# Patient Record
Sex: Female | Born: 1940
Health system: Southern US, Community
[De-identification: ages and names within clinical notes are randomized; demographics above are authoritative.]

## PROBLEM LIST (undated history)

## (undated) DIAGNOSIS — K909 Intestinal malabsorption, unspecified: Secondary | ICD-10-CM

## (undated) DIAGNOSIS — D509 Iron deficiency anemia, unspecified: Secondary | ICD-10-CM

## (undated) HISTORY — DX: Iron deficiency anemia, unspecified: D50.9

## (undated) HISTORY — DX: Intestinal malabsorption, unspecified: K90.9

---

## 2002-11-16 ENCOUNTER — Encounter: Payer: Self-pay | Admitting: Hematology & Oncology

## 2002-11-16 ENCOUNTER — Ambulatory Visit (HOSPITAL_COMMUNITY): Admission: RE | Admit: 2002-11-16 | Discharge: 2002-11-16 | Payer: Self-pay | Admitting: Hematology & Oncology

## 2002-12-02 ENCOUNTER — Encounter (INDEPENDENT_AMBULATORY_CARE_PROVIDER_SITE_OTHER): Payer: Self-pay | Admitting: *Deleted

## 2002-12-02 ENCOUNTER — Ambulatory Visit (HOSPITAL_COMMUNITY): Admission: RE | Admit: 2002-12-02 | Discharge: 2002-12-02 | Payer: Self-pay | Admitting: Hematology & Oncology

## 2003-04-07 ENCOUNTER — Encounter: Payer: Self-pay | Admitting: Hematology & Oncology

## 2003-04-07 ENCOUNTER — Ambulatory Visit (HOSPITAL_COMMUNITY): Admission: RE | Admit: 2003-04-07 | Discharge: 2003-04-07 | Payer: Self-pay | Admitting: Hematology & Oncology

## 2004-09-16 ENCOUNTER — Ambulatory Visit: Payer: Self-pay | Admitting: Hematology & Oncology

## 2005-03-18 ENCOUNTER — Ambulatory Visit: Payer: Self-pay | Admitting: Hematology & Oncology

## 2005-09-16 ENCOUNTER — Ambulatory Visit: Payer: Self-pay | Admitting: Hematology & Oncology

## 2006-01-22 ENCOUNTER — Ambulatory Visit: Payer: Self-pay | Admitting: Hematology & Oncology

## 2006-01-23 LAB — CBC WITH DIFFERENTIAL/PLATELET
BASO%: 0.4 % (ref 0.0–2.0)
EOS%: 1.2 % (ref 0.0–7.0)
MCH: 34 pg (ref 26.0–34.0)
MCHC: 34.1 g/dL (ref 32.0–36.0)
RBC: 4.28 10*6/uL (ref 3.70–5.32)
RDW: 13.1 % (ref 11.3–14.5)
lymph#: 5.7 10*3/uL — ABNORMAL HIGH (ref 0.9–3.3)

## 2006-01-23 LAB — CHCC SMEAR

## 2006-08-17 ENCOUNTER — Ambulatory Visit: Payer: Self-pay | Admitting: Hematology & Oncology

## 2006-08-19 LAB — CBC WITH DIFFERENTIAL/PLATELET
BASO%: 0.7 % (ref 0.0–2.0)
EOS%: 1.9 % (ref 0.0–7.0)
HCT: 43.2 % (ref 34.8–46.6)
LYMPH%: 41.8 % (ref 14.0–48.0)
MCH: 34.4 pg — ABNORMAL HIGH (ref 26.0–34.0)
MCHC: 33.9 g/dL (ref 32.0–36.0)
MCV: 101.6 fL — ABNORMAL HIGH (ref 81.0–101.0)
MONO%: 10.1 % (ref 0.0–13.0)
NEUT%: 45.5 % (ref 39.6–76.8)
Platelets: 315 10*3/uL (ref 145–400)
lymph#: 5.6 10*3/uL — ABNORMAL HIGH (ref 0.9–3.3)

## 2006-09-22 ENCOUNTER — Encounter: Admission: RE | Admit: 2006-09-22 | Discharge: 2006-09-22 | Payer: Self-pay | Admitting: Hematology & Oncology

## 2006-10-07 ENCOUNTER — Ambulatory Visit: Payer: Self-pay | Admitting: Oncology

## 2007-01-13 ENCOUNTER — Encounter: Admission: RE | Admit: 2007-01-13 | Discharge: 2007-01-13 | Payer: Self-pay | Admitting: Hematology & Oncology

## 2007-05-03 ENCOUNTER — Ambulatory Visit: Payer: Self-pay | Admitting: Hematology & Oncology

## 2007-06-07 LAB — CBC WITH DIFFERENTIAL/PLATELET
Basophils Absolute: 0.1 10*3/uL (ref 0.0–0.1)
EOS%: 1.8 % (ref 0.0–7.0)
HCT: 39.3 % (ref 34.8–46.6)
HGB: 13.7 g/dL (ref 11.6–15.9)
LYMPH%: 45.5 % (ref 14.0–48.0)
MCH: 34 pg (ref 26.0–34.0)
MCHC: 34.9 g/dL (ref 32.0–36.0)
MCV: 97.5 fL (ref 81.0–101.0)
MONO%: 11.7 % (ref 0.0–13.0)
NEUT%: 40.3 % (ref 39.6–76.8)
Platelets: 303 10*3/uL (ref 145–400)
lymph#: 3.9 10*3/uL — ABNORMAL HIGH (ref 0.9–3.3)

## 2007-12-13 ENCOUNTER — Ambulatory Visit: Payer: Self-pay | Admitting: Hematology & Oncology

## 2007-12-15 LAB — COMPREHENSIVE METABOLIC PANEL
ALT: 12 U/L (ref 0–35)
CO2: 25 mEq/L (ref 19–32)
Chloride: 103 mEq/L (ref 96–112)
Sodium: 138 mEq/L (ref 135–145)
Total Bilirubin: 0.4 mg/dL (ref 0.3–1.2)
Total Protein: 6.8 g/dL (ref 6.0–8.3)

## 2007-12-15 LAB — CBC WITH DIFFERENTIAL/PLATELET
BASO%: 0.7 % (ref 0.0–2.0)
EOS%: 2.4 % (ref 0.0–7.0)
HCT: 41.6 % (ref 34.8–46.6)
LYMPH%: 56 % — ABNORMAL HIGH (ref 14.0–48.0)
MCH: 33.6 pg (ref 26.0–34.0)
MCHC: 34.1 g/dL (ref 32.0–36.0)
MCV: 98.7 fL (ref 81.0–101.0)
MONO#: 1 10*3/uL — ABNORMAL HIGH (ref 0.1–0.9)
MONO%: 10.4 % (ref 0.0–13.0)
NEUT%: 30.5 % — ABNORMAL LOW (ref 39.6–76.8)
Platelets: 221 10*3/uL (ref 145–400)
RBC: 4.22 10*6/uL (ref 3.70–5.32)
RDW: 12.7 % (ref 11.3–14.5)
WBC: 9.8 10*3/uL (ref 3.9–10.0)

## 2007-12-15 LAB — LACTATE DEHYDROGENASE: LDH: 145 U/L (ref 94–250)

## 2007-12-15 LAB — CHCC SMEAR

## 2008-06-13 ENCOUNTER — Ambulatory Visit: Payer: Self-pay | Admitting: Hematology & Oncology

## 2008-06-14 LAB — CBC WITH DIFFERENTIAL (CANCER CENTER ONLY)
Eosinophils Absolute: 0.3 10*3/uL (ref 0.0–0.5)
LYMPH#: 6.1 10*3/uL — ABNORMAL HIGH (ref 0.9–3.3)
MONO#: 1 10*3/uL — ABNORMAL HIGH (ref 0.1–0.9)
NEUT#: 4 10*3/uL (ref 1.5–6.5)
Platelets: 315 10*3/uL (ref 145–400)
RBC: 4.21 10*6/uL (ref 3.70–5.32)
WBC: 11.6 10*3/uL — ABNORMAL HIGH (ref 3.9–10.0)

## 2008-12-01 ENCOUNTER — Ambulatory Visit: Payer: Self-pay | Admitting: Hematology & Oncology

## 2008-12-11 LAB — CBC WITH DIFFERENTIAL (CANCER CENTER ONLY)
EOS%: 2 % (ref 0.0–7.0)
MCH: 33.3 pg (ref 26.0–34.0)
MCHC: 33.5 g/dL (ref 32.0–36.0)
MONO%: 7.7 % (ref 0.0–13.0)
NEUT#: 4.4 10*3/uL (ref 1.5–6.5)
Platelets: 329 10*3/uL (ref 145–400)
RBC: 4.01 10*6/uL (ref 3.70–5.32)

## 2009-07-17 ENCOUNTER — Ambulatory Visit: Payer: Self-pay | Admitting: Hematology & Oncology

## 2009-07-18 LAB — FERRITIN: Ferritin: 45 ng/mL (ref 10–291)

## 2009-07-18 LAB — COMPREHENSIVE METABOLIC PANEL
ALT: 14 U/L (ref 0–35)
AST: 19 U/L (ref 0–37)
Albumin: 4.3 g/dL (ref 3.5–5.2)
CO2: 27 mEq/L (ref 19–32)
Calcium: 9.3 mg/dL (ref 8.4–10.5)
Chloride: 103 mEq/L (ref 96–112)
Creatinine, Ser: 0.63 mg/dL (ref 0.40–1.20)
Potassium: 4.2 mEq/L (ref 3.5–5.3)

## 2009-07-18 LAB — CBC WITH DIFFERENTIAL (CANCER CENTER ONLY)
BASO#: 0.1 10*3/uL (ref 0.0–0.2)
BASO%: 1 % (ref 0.0–2.0)
EOS%: 2.6 % (ref 0.0–7.0)
HGB: 14.3 g/dL (ref 11.6–15.9)
MCH: 34 pg (ref 26.0–34.0)
MCHC: 33.9 g/dL (ref 32.0–36.0)
MONO%: 10.6 % (ref 0.0–13.0)
NEUT#: 1.9 10*3/uL (ref 1.5–6.5)
NEUT%: 27.7 % — ABNORMAL LOW (ref 39.6–80.0)
RDW: 10.8 % (ref 10.5–14.6)

## 2009-07-18 LAB — RETICULOCYTES (CHCC)
ABS Retic: 75.6 10*3/uL (ref 19.0–186.0)
RBC.: 4.2 MIL/uL (ref 3.87–5.11)
Retic Ct Pct: 1.8 % (ref 0.4–3.1)

## 2009-07-18 LAB — CHCC SATELLITE - SMEAR

## 2010-01-14 ENCOUNTER — Ambulatory Visit: Payer: Self-pay | Admitting: Hematology & Oncology

## 2010-01-16 LAB — CBC WITH DIFFERENTIAL (CANCER CENTER ONLY)
BASO%: 0.9 % (ref 0.0–2.0)
LYMPH#: 5.5 10*3/uL — ABNORMAL HIGH (ref 0.9–3.3)
MONO#: 0.8 10*3/uL (ref 0.1–0.9)
NEUT#: 3.1 10*3/uL (ref 1.5–6.5)
Platelets: 335 10*3/uL (ref 145–400)
RDW: 10.9 % (ref 10.5–14.6)
WBC: 9.7 10*3/uL (ref 3.9–10.0)

## 2010-01-16 LAB — MORPHOLOGY - CHCC SATELLITE
PLT EST ~~LOC~~: ADEQUATE
Platelet Morphology: NORMAL
RBC Comments: NORMAL

## 2010-07-09 ENCOUNTER — Ambulatory Visit: Payer: Self-pay | Admitting: Hematology & Oncology

## 2010-08-01 LAB — CBC WITH DIFFERENTIAL (CANCER CENTER ONLY)
BASO#: 0.1 10e3/uL (ref 0.0–0.2)
BASO%: 1 % (ref 0.0–2.0)
EOS%: 1.8 % (ref 0.0–7.0)
Eosinophils Absolute: 0.2 10e3/uL (ref 0.0–0.5)
HCT: 41.1 % (ref 34.8–46.6)
HGB: 14.2 g/dL (ref 11.6–15.9)
LYMPH#: 5.7 10e3/uL — ABNORMAL HIGH (ref 0.9–3.3)
LYMPH%: 59.9 % — ABNORMAL HIGH (ref 14.0–48.0)
MCH: 34.9 pg — ABNORMAL HIGH (ref 26.0–34.0)
MCHC: 34.5 g/dL (ref 32.0–36.0)
MCV: 101 fL (ref 81–101)
MONO#: 1.1 10e3/uL — ABNORMAL HIGH (ref 0.1–0.9)
MONO%: 11.2 % (ref 0.0–13.0)
NEUT#: 2.5 10e3/uL (ref 1.5–6.5)
NEUT%: 26.1 % — ABNORMAL LOW (ref 39.6–80.0)
Platelets: 349 10e3/uL (ref 145–400)
RBC: 4.07 10e6/uL (ref 3.70–5.32)
RDW: 10.3 % — ABNORMAL LOW (ref 10.5–14.6)
WBC: 9.6 10e3/uL (ref 3.9–10.0)

## 2010-08-01 LAB — TECHNOLOGIST REVIEW CHCC SATELLITE

## 2010-08-01 LAB — CHCC SATELLITE - SMEAR

## 2011-02-06 ENCOUNTER — Encounter (HOSPITAL_BASED_OUTPATIENT_CLINIC_OR_DEPARTMENT_OTHER): Payer: Medicare Other | Admitting: Hematology & Oncology

## 2011-02-06 ENCOUNTER — Other Ambulatory Visit: Payer: Self-pay | Admitting: Hematology & Oncology

## 2011-02-06 DIAGNOSIS — D510 Vitamin B12 deficiency anemia due to intrinsic factor deficiency: Secondary | ICD-10-CM

## 2011-02-06 DIAGNOSIS — D693 Immune thrombocytopenic purpura: Secondary | ICD-10-CM

## 2011-02-06 LAB — CBC WITH DIFFERENTIAL (CANCER CENTER ONLY)
BASO#: 0 10*3/uL (ref 0.0–0.2)
EOS%: 0.9 % (ref 0.0–7.0)
HGB: 13.6 g/dL (ref 11.6–15.9)
MCH: 32.9 pg (ref 26.0–34.0)
MCHC: 35.4 g/dL (ref 32.0–36.0)
MONO%: 12 % (ref 0.0–13.0)
NEUT#: 2.8 10*3/uL (ref 1.5–6.5)
Platelets: 237 10*3/uL (ref 145–400)

## 2011-02-07 LAB — VITAMIN D 25 HYDROXY (VIT D DEFICIENCY, FRACTURES): Vit D, 25-Hydroxy: 33 ng/mL (ref 30–89)

## 2011-03-07 NOTE — Op Note (Signed)
   NAME:  Kathleen Pace, COURTNEY NO.:  [id number]   MEDICAL RECORD NO.:  [id number]                   PATIENT TYPE:  OUT   LOCATION:  [facility]                                 FACILITY:  [facility]   PHYSICIAN:  Rose Phi. Myna Hidalgo, M.D.              DATE OF BIRTH:  1941/09/22   DATE OF PROCEDURE:  DATE OF DISCHARGE:                                 OPERATIVE REPORT   PROCEDURE:  Left posterior iliac crest bone graft biopsy and aspirate.   DESCRIPTION OF PROCEDURE:  [name] was brought to the short-stay unit.  She was placed on a monitor.  She had an IV placed.  All vital signs were  stable.  She received a total of 4 mg of Versed IV and 30 mg of Demerol.  She had her left posterior iliac crest region prepped and draped in a  sterile fashion.  She then had 10 cc of lidocaine infiltrated under the skin  down to periosteum.  Carefully, a #11 scalpel was used to make an incision  to the skin.  A good bone marrow aspirate was obtained without difficulties.  A second aspirate was obtained for cytogenetics.   A second incision was made to the skin with a #11 scalpel.  Two bone marrow  cores were taken without difficulty.  The patient tolerated the procedure  well.  There was no bleeding.                                               Rose Phi. Myna Hidalgo, M.D.    PRE/MEDQ  D:  12/02/2002  T:  12/02/2002  Job:  [id number]

## 2011-09-18 ENCOUNTER — Encounter: Payer: Self-pay | Admitting: *Deleted

## 2011-09-19 ENCOUNTER — Ambulatory Visit (HOSPITAL_BASED_OUTPATIENT_CLINIC_OR_DEPARTMENT_OTHER): Payer: Medicare Other | Admitting: Hematology & Oncology

## 2011-09-19 ENCOUNTER — Other Ambulatory Visit (HOSPITAL_BASED_OUTPATIENT_CLINIC_OR_DEPARTMENT_OTHER): Payer: Medicare Other | Admitting: Lab

## 2011-09-19 ENCOUNTER — Other Ambulatory Visit (HOSPITAL_COMMUNITY)
Admission: RE | Admit: 2011-09-19 | Discharge: 2011-09-19 | Disposition: A | Discharge status: Home / Self Care | Payer: Medicare Other | Source: Ambulatory Visit | Attending: Hematology & Oncology | Admitting: Hematology & Oncology

## 2011-09-19 ENCOUNTER — Other Ambulatory Visit: Payer: Self-pay | Admitting: Hematology & Oncology

## 2011-09-19 ENCOUNTER — Telehealth: Payer: Self-pay | Admitting: *Deleted

## 2011-09-19 DIAGNOSIS — D510 Vitamin B12 deficiency anemia due to intrinsic factor deficiency: Secondary | ICD-10-CM

## 2011-09-19 DIAGNOSIS — D693 Immune thrombocytopenic purpura: Secondary | ICD-10-CM

## 2011-09-19 DIAGNOSIS — D72820 Lymphocytosis (symptomatic): Secondary | ICD-10-CM | POA: Insufficient documentation

## 2011-09-19 DIAGNOSIS — L80 Vitiligo: Secondary | ICD-10-CM | POA: Insufficient documentation

## 2011-09-19 LAB — CBC WITH DIFFERENTIAL (CANCER CENTER ONLY)
BASO%: 0.4 % (ref 0.0–2.0)
Eosinophils Absolute: 0.2 10*3/uL (ref 0.0–0.5)
LYMPH%: 56.7 % — ABNORMAL HIGH (ref 14.0–48.0)
MCH: 34.6 pg — ABNORMAL HIGH (ref 26.0–34.0)
MCHC: 35.1 g/dL (ref 32.0–36.0)
MCV: 99 fL (ref 81–101)
MONO%: 11.8 % (ref 0.0–13.0)
Platelets: 300 10*3/uL (ref 145–400)
RDW: 12.3 % (ref 11.1–15.7)

## 2011-09-19 LAB — CHCC SATELLITE - SMEAR

## 2011-09-19 NOTE — Telephone Encounter (Signed)
Pt moved 4-4 to 4-11 °

## 2011-09-19 NOTE — Progress Notes (Signed)
This office note has been dictated.

## 2011-09-19 NOTE — Progress Notes (Signed)
DIAGNOSIS: 1. Immune thrombocytopenia-remission. 2. Pernicious anemia. 3. Vitiligo.  CURRENT THERAPY:  Vitamin B12 1 mg IM q. month-self-administered.  INTERIM HISTORY:  Kathleen Pace comes in for a 6 month followup.  She is doing well.  She has had no complaints since we last saw her.  She did have a bout of pneumonia last year.  Since then, she has had no issues. She has not noted any problems with nausea or vomiting.  She has had no bleeding or bruising.  There has been no change in bowel or bladder habits.  PHYSICAL EXAM:  General: This is a well-developed, well-nourished, white female in no obvious distress.  Vital signs show a temperature of 98.7, pulse 55, respiratory 18, blood pressure 153/76, and weight is 146. Head and neck exam shows a normocephalic, atraumatic skull.  There are no ocular or oral lesions.  There are no palpable cervical or supraclavicular lymph nodes.  Lungs are clear to percussion and auscultation bilaterally.  Cardiac exam: Regular rate and rhythm with a normal S1 and S2.  There are no murmurs, rubs, or bruits.  Abdominal exam: Soft with good bowel sounds.  There is no palpable abdominal mass. There is no palpable hepatomegaly.  He has a well-healed splenectomy scar.  Back exam shows kyphosis.  She has no tenderness over the spine, ribs, or hips.  Extremities shows no clubbing, cyanosis, or edema. Neurological exam shows no focal neurological deficits.  Skin exam shows areas of vitiligo.  LABORATORY STUDIES:  White cell count 8, hemoglobin 14, hematocrit 40, and platelet count is 300,000.  White cell differential shows 30 segs, 57 lymphocytes, and 12 monos.  IMPRESSION:  Kathleen Pace is a 70 year old white female with multiple autoimmune issues.  Right now, I do not see that the elevated lymphocytes is going to be an issue for [place].  We are sending her blood for flow cytometry.  It is possible she may have a monoclonal population of cells.  So, I really  do not believe that we need to embark upon any extensive workup because she is asymptomatic.  We will plan to get her back to see [place] in another 5 months.    ______________________________ Josph Macho, M.D. PRE/MEDQ  D:  09/19/2011  T:  09/19/2011  Job:  [id number]  ADDENDUM:  Flow cytometry is (-).  Normal Ferritin

## 2011-09-23 LAB — PROTEIN ELECTROPHORESIS, SERUM
Alpha-1-Globulin: 4.6 % (ref 2.9–4.9)
Alpha-2-Globulin: 10.3 % (ref 7.1–11.8)
Beta 2: 6.1 % (ref 3.2–6.5)
Beta Globulin: 6.8 % (ref 4.7–7.2)
Gamma Globulin: 14.4 % (ref 11.1–18.8)

## 2011-09-23 LAB — ERYTHROPOIETIN: Erythropoietin: 12.9 m[IU]/mL (ref 2.6–34.0)

## 2011-09-23 LAB — LACTATE DEHYDROGENASE: LDH: 153 U/L (ref 94–250)

## 2011-09-24 LAB — FLOW CYTOMETRY - CHCC SATELLITE

## 2011-09-30 NOTE — Progress Notes (Signed)
Quick Note:  Spoke with pt and told her that Dr. Myna Hidalgo did not see any blood disorder like leukemia in her blood. Pt voiced understanding. ______

## 2012-01-29 ENCOUNTER — Other Ambulatory Visit (HOSPITAL_BASED_OUTPATIENT_CLINIC_OR_DEPARTMENT_OTHER): Payer: Medicare Other | Admitting: Lab

## 2012-01-29 ENCOUNTER — Ambulatory Visit (HOSPITAL_BASED_OUTPATIENT_CLINIC_OR_DEPARTMENT_OTHER): Payer: Medicare Other | Admitting: Hematology & Oncology

## 2012-01-29 VITALS — BP 124/68 | HR 68 | Temp 98.5°F | Ht 63.0 in | Wt 144.0 lb

## 2012-01-29 DIAGNOSIS — D693 Immune thrombocytopenic purpura: Secondary | ICD-10-CM

## 2012-01-29 DIAGNOSIS — L80 Vitiligo: Secondary | ICD-10-CM

## 2012-01-29 DIAGNOSIS — D510 Vitamin B12 deficiency anemia due to intrinsic factor deficiency: Secondary | ICD-10-CM

## 2012-01-29 LAB — CBC WITH DIFFERENTIAL (CANCER CENTER ONLY)
BASO#: 0 10*3/uL (ref 0.0–0.2)
Eosinophils Absolute: 0.1 10*3/uL (ref 0.0–0.5)
HGB: 13.8 g/dL (ref 11.6–15.9)
LYMPH#: 3.7 10*3/uL — ABNORMAL HIGH (ref 0.9–3.3)
MONO#: 0.9 10*3/uL (ref 0.1–0.9)
NEUT#: 2 10*3/uL (ref 1.5–6.5)
RBC: 3.97 10*6/uL (ref 3.70–5.32)

## 2012-01-29 NOTE — Progress Notes (Signed)
This office note has been dictated.
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
## 2012-04-15 ENCOUNTER — Other Ambulatory Visit: Payer: Self-pay | Admitting: Hematology & Oncology

## 2012-07-29 ENCOUNTER — Other Ambulatory Visit (HOSPITAL_BASED_OUTPATIENT_CLINIC_OR_DEPARTMENT_OTHER): Payer: Medicare Other | Admitting: Lab

## 2012-07-29 ENCOUNTER — Ambulatory Visit (HOSPITAL_BASED_OUTPATIENT_CLINIC_OR_DEPARTMENT_OTHER): Payer: Medicare Other | Admitting: Medical

## 2012-07-29 VITALS — BP 159/53 | HR 61 | Temp 98.3°F | Resp 16 | Ht 63.0 in | Wt 143.0 lb

## 2012-07-29 DIAGNOSIS — D510 Vitamin B12 deficiency anemia due to intrinsic factor deficiency: Secondary | ICD-10-CM

## 2012-07-29 DIAGNOSIS — D693 Immune thrombocytopenic purpura: Secondary | ICD-10-CM

## 2012-07-29 LAB — CBC WITH DIFFERENTIAL (CANCER CENTER ONLY)
Eosinophils Absolute: 0.1 10*3/uL (ref 0.0–0.5)
HCT: 41.5 % (ref 34.8–46.6)
LYMPH%: 55.7 % — ABNORMAL HIGH (ref 14.0–48.0)
MCV: 100 fL (ref 81–101)
MONO#: 1.5 10*3/uL — ABNORMAL HIGH (ref 0.1–0.9)
NEUT%: 29.1 % — ABNORMAL LOW (ref 39.6–80.0)
Platelets: 331 10*3/uL (ref 145–400)
RBC: 4.15 10*6/uL (ref 3.70–5.32)
WBC: 10.6 10*3/uL — ABNORMAL HIGH (ref 3.9–10.0)

## 2012-07-29 LAB — CHCC SATELLITE - SMEAR

## 2012-07-29 NOTE — Progress Notes (Signed)
Diagnoses: #1 immune thrombocytopenia, clinical remission. #2, pernicious anemia. #3 vitiligo.  Current therapy: Vitamin B12, 1 mg IM q. monthly (self-administered).  Interim history: Kathleen Pace presents today for an office followup visit.  Overall, she, reports, that she's been doing relatively well. She reports, that she's had some sinus issues for the past week and is wanting an antibiotic.  She does have a productive cough, and reports, that her sputum is yellowish in color.  She denies any type of fevers.  She continues to remain active.  She still continues to take care of her 34 year old, mother.  She denies any chest pain, shortness of breath.  Any bony pain.  She denies any headaches, visual changes, or rashes.  She denies any fevers, chills, or night sweats.  She denies any nausea, vomiting, diarrhea, or constipation.  She has a good appetite.  Her lab work still continues to look good.  Her platelet count is 331,000.  Review of Systems: Pt. Denies any changes in their vision, hearing, adenopathy, fevers, chills, nausea, vomiting, diarrhea, constipation, chest pain, shortness of breath, passing blood, passing out, blacking out,  any changes in skin, joints, neurologic or psychiatric except as noted.  Physical Exam: This is a well-developed, well-nourished 71 year old, white female, in no obvious distress Vitals: Temperature 90.3 degrees, pulse 61, respirations 16, blood pressure 159/53, weight 143 pounds HEENT reveals a normocephalic, atraumatic skull, no scleral icterus, no oral lesions  Neck is supple without any cervical or supraclavicular adenopathy.  Lungs are clear to auscultation bilaterally. There are no wheezes, rales or rhonci Cardiac is regular rate and rhythm with a normal S1 and S2. There are no murmurs, rubs, or bruits.  Abdomen is soft with good bowel sounds, there is no palpable mass. There is no palpable hepatosplenomegaly. There is no palpable fluid wave.    Musculoskeletal no tenderness of the spine, ribs, or hips.  Back exam does show some kyphosis, which is stable Extremities there are no clubbing, cyanosis, or edema.  Skin no petechia, purpura or ecchymosis Neurologic is nonfocal.  Laboratory Data: White count 10.6, hemoglobin 14.3, hematocrit 41.5.  Platelets 331,000  Current Outpatient Prescriptions on File Prior to Visit  Medication Sig Dispense Refill  . acetaminophen (TYLENOL) 325 MG tablet Take 650 mg by mouth every 6 (six) hours as needed.        . budesonide-formoterol (SYMBICORT) 80-4.5 MCG/ACT inhaler Inhale 2 puffs into the lungs 2 (two) times daily.        . calcium-vitamin D (OSCAL WITH D) 500-200 MG-UNIT per tablet Take 1 tablet by mouth daily.        . cyanocobalamin (,VITAMIN B-12,) 1000 MCG/ML injection INJECT 1 ML ONCE MONTHLY  10 mL  0  . esomeprazole (NEXIUM) 40 MG capsule Take 40 mg by mouth daily before breakfast.        . levothyroxine (SYNTHROID, LEVOTHROID) 125 MCG tablet Take 125 mcg by mouth daily.      Marland Kitchen omega-3 acid ethyl esters (LOVAZA) 1 G capsule Take 2 g by mouth 2 (two) times daily.        . risedronate (ACTONEL) 150 MG tablet Take 150 mg by mouth every 30 (thirty) days. with water on empty stomach, nothing by mouth or lie down for next 30 minutes.       . sertraline (ZOLOFT) 50 MG tablet Take 50 mg by mouth daily.        . vitamin B-12 (CYANOCOBALAMIN) 1000 MCG tablet 1,000 mcg by Other route daily.  Assessment/Plan: This is a pleasant, 71 year old, white female, with the following issues:  #1.  History of immune thrombocytopenia..  She does have an odd immune complex.  Her platelets continue to be within the normal limits.  For now we do not need to do anything different.  #2, pernicious anemia.  She will continue to give herself self injections of B12.  #3 followup.  [name] will follow back up with [place] in 6 months, but before then should there be questions or concerns.

## 2013-01-27 ENCOUNTER — Other Ambulatory Visit (HOSPITAL_BASED_OUTPATIENT_CLINIC_OR_DEPARTMENT_OTHER): Payer: Medicare Other | Admitting: Lab

## 2013-01-27 ENCOUNTER — Ambulatory Visit (HOSPITAL_BASED_OUTPATIENT_CLINIC_OR_DEPARTMENT_OTHER): Payer: Medicare Other | Admitting: Hematology & Oncology

## 2013-01-27 VITALS — BP 124/61 | HR 56 | Temp 97.9°F | Resp 16 | Ht 63.0 in | Wt 146.0 lb

## 2013-01-27 DIAGNOSIS — D510 Vitamin B12 deficiency anemia due to intrinsic factor deficiency: Secondary | ICD-10-CM

## 2013-01-27 DIAGNOSIS — D72829 Elevated white blood cell count, unspecified: Secondary | ICD-10-CM

## 2013-01-27 DIAGNOSIS — D693 Immune thrombocytopenic purpura: Secondary | ICD-10-CM

## 2013-01-27 DIAGNOSIS — D72820 Lymphocytosis (symptomatic): Secondary | ICD-10-CM

## 2013-01-27 LAB — CBC WITH DIFFERENTIAL (CANCER CENTER ONLY)
BASO#: 0 10*3/uL (ref 0.0–0.2)
BASO%: 0.2 % (ref 0.0–2.0)
EOS%: 0.7 % (ref 0.0–7.0)
HCT: 38.5 % (ref 34.8–46.6)
HGB: 13.1 g/dL (ref 11.6–15.9)
LYMPH#: 4.9 10*3/uL — ABNORMAL HIGH (ref 0.9–3.3)
MCH: 34 pg (ref 26.0–34.0)
MCHC: 34 g/dL (ref 32.0–36.0)
MONO%: 12.5 % (ref 0.0–13.0)
NEUT#: 2.8 10*3/uL (ref 1.5–6.5)
NEUT%: 31.9 % — ABNORMAL LOW (ref 39.6–80.0)
RDW: 13 % (ref 11.1–15.7)

## 2013-01-27 NOTE — Progress Notes (Signed)
This office note has been dictated.
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
## 2013-03-02 ENCOUNTER — Ambulatory Visit: Payer: Medicare Other | Admitting: Hematology & Oncology

## 2013-03-02 ENCOUNTER — Ambulatory Visit: Payer: Medicare Other

## 2013-03-02 ENCOUNTER — Other Ambulatory Visit: Payer: Medicare Other | Admitting: Lab

## 2013-06-29 ENCOUNTER — Other Ambulatory Visit: Payer: Self-pay | Admitting: Hematology & Oncology

## 2013-07-28 ENCOUNTER — Other Ambulatory Visit: Payer: Medicare Other | Admitting: Lab

## 2013-07-28 ENCOUNTER — Ambulatory Visit (HOSPITAL_BASED_OUTPATIENT_CLINIC_OR_DEPARTMENT_OTHER): Payer: Medicare Other | Admitting: Hematology & Oncology

## 2013-07-28 ENCOUNTER — Other Ambulatory Visit (HOSPITAL_BASED_OUTPATIENT_CLINIC_OR_DEPARTMENT_OTHER): Payer: Medicare Other | Admitting: Lab

## 2013-07-28 ENCOUNTER — Ambulatory Visit: Payer: Medicare Other | Admitting: Hematology & Oncology

## 2013-07-28 VITALS — BP 115/56 | HR 70 | Temp 97.9°F | Resp 14 | Ht 63.0 in | Wt 145.0 lb

## 2013-07-28 DIAGNOSIS — D510 Vitamin B12 deficiency anemia due to intrinsic factor deficiency: Secondary | ICD-10-CM

## 2013-07-28 DIAGNOSIS — D693 Immune thrombocytopenic purpura: Secondary | ICD-10-CM

## 2013-07-28 LAB — CBC WITH DIFFERENTIAL (CANCER CENTER ONLY)
BASO%: 0.3 % (ref 0.0–2.0)
EOS%: 1 % (ref 0.0–7.0)
LYMPH%: 56.2 % — ABNORMAL HIGH (ref 14.0–48.0)
MCH: 34.1 pg — ABNORMAL HIGH (ref 26.0–34.0)
MCV: 101 fL (ref 81–101)
MONO%: 12.5 % (ref 0.0–13.0)
NEUT#: 2.9 10*3/uL (ref 1.5–6.5)
Platelets: 313 10*3/uL (ref 145–400)
RDW: 12.6 % (ref 11.1–15.7)

## 2013-07-28 LAB — CHCC SATELLITE - SMEAR

## 2013-07-28 NOTE — Progress Notes (Signed)
This office note has been dictated.

## 2013-07-29 NOTE — Progress Notes (Signed)
CC:   [name], MD  DIAGNOSES: 1. Chronic immune thrombocytopenia, remission. 2. Pernicious anemia. 3. Vitiligo.  CURRENT THERAPY:  Self-administered vitamin B12 one milligram IM q. month.  INTERIM HISTORY:  [name] comes in for her followup.  She is doing great.  We last saw her back in April.  She has had a good summer.  She has been doing some traveling.  She has had no problems with nausea or vomiting.  There has been no cough or shortness of breath.  There is no change in bowel or bladder habits.  She has had no fatigue or weakness.  There has been no leg swelling.  There has been no bony pain.  PHYSICAL EXAMINATION:  General:  This is a well-developed, well- nourished white female in no obvious distress.  Vital signs: Temperature of 97.9, pulse 67, respiratory rate 14, blood pressure 182/55.  Weight is 145.  Head and neck:  Normocephalic, atraumatic skull.  There are no ocular or oral lesions.  There are no palpable cervical or supraclavicular lymph nodes.  Lungs:  Clear bilaterally. Cardiac:  Regular rate and rhythm with a normal S1 and S2.  She may have a 1/6 systolic ejection murmur.  Abdomen:  Soft.  She has good bowel sounds.  There is no fluid wave.  There is no palpable hepatomegaly. She has a well-healed splenectomy scar.  Back:  Some kyphosis.  She has a little bit of scoliosis.  No tenderness is noted over the spine, ribs, or hips.  Extremities:  No clubbing, cyanosis or edema.  Skin:  Some areas of vitiligo.  Neurologic:  No focal neurological deficits.  LABORATORY STUDIES:  White cell count is 9.6, hemoglobin 13.5, hematocrit 40, platelet count 313,000.  White cell differential shows 30 segs, 56 lymphs, 12 monos.  Peripheral smear, which I reviewed, did not show any immature myeloid or lymphoid cells.  There were no smudge cells.  There was no rouleaux formation.  She has no schistocytes.  Platelets are adequate in number and size.  IMPRESSION:  Ms.  Kathleen Pace is a very charming 72 year old white female with a history of chronic immune thrombocytopenia.  This has not been a problem for her.  I do not see anything on her blood smear that looks suspicious.  We will go ahead and plan to get her back in 6 more months.  I do not see a need for any blood work in between visits.    ______________________________ Josph Macho, M.D. PRE/MEDQ  D:  07/28/2013  T:  07/29/2013  Job:  [id number]

## 2014-01-26 ENCOUNTER — Encounter: Payer: Self-pay | Admitting: Hematology & Oncology

## 2014-01-26 ENCOUNTER — Other Ambulatory Visit (HOSPITAL_BASED_OUTPATIENT_CLINIC_OR_DEPARTMENT_OTHER): Payer: Medicare HMO | Admitting: Lab

## 2014-01-26 ENCOUNTER — Ambulatory Visit (HOSPITAL_BASED_OUTPATIENT_CLINIC_OR_DEPARTMENT_OTHER): Payer: Medicare HMO | Admitting: Hematology & Oncology

## 2014-01-26 VITALS — BP 173/54 | HR 70 | Temp 98.2°F | Resp 14 | Ht 65.0 in | Wt 142.0 lb

## 2014-01-26 DIAGNOSIS — J019 Acute sinusitis, unspecified: Secondary | ICD-10-CM | POA: Diagnosis not present

## 2014-01-26 DIAGNOSIS — D510 Vitamin B12 deficiency anemia due to intrinsic factor deficiency: Secondary | ICD-10-CM

## 2014-01-26 DIAGNOSIS — D693 Immune thrombocytopenic purpura: Secondary | ICD-10-CM

## 2014-01-26 LAB — CBC WITH DIFFERENTIAL (CANCER CENTER ONLY)
BASO#: 0 10*3/uL (ref 0.0–0.2)
BASO%: 0.2 % (ref 0.0–2.0)
EOS%: 1.4 % (ref 0.0–7.0)
Eosinophils Absolute: 0.2 10*3/uL (ref 0.0–0.5)
HEMATOCRIT: 39.9 % (ref 34.8–46.6)
HGB: 13.5 g/dL (ref 11.6–15.9)
LYMPH#: 5.1 10*3/uL — AB (ref 0.9–3.3)
LYMPH%: 38.5 % (ref 14.0–48.0)
MCH: 33.3 pg (ref 26.0–34.0)
MCHC: 33.8 g/dL (ref 32.0–36.0)
MCV: 98 fL (ref 81–101)
MONO#: 1.5 10*3/uL — ABNORMAL HIGH (ref 0.1–0.9)
MONO%: 11.5 % (ref 0.0–13.0)
NEUT%: 48.4 % (ref 39.6–80.0)
NEUTROS ABS: 6.4 10*3/uL (ref 1.5–6.5)
Platelets: 322 10*3/uL (ref 145–400)
RBC: 4.06 10*6/uL (ref 3.70–5.32)
RDW: 12.6 % (ref 11.1–15.7)
WBC: 13.1 10*3/uL — AB (ref 3.9–10.0)

## 2014-01-26 LAB — CHCC SATELLITE - SMEAR

## 2014-01-26 MED ORDER — AZITHROMYCIN 250 MG PO TABS: ORAL_TABLET | ORAL | Status: DC

## 2014-01-26 NOTE — Progress Notes (Signed)
Hematology and Oncology Follow Up Visit  Kathleen Pace [id number] [birth year] 73 y.o. 01/26/2014   Principle Diagnosis:  Chronic immune thrombocytopenia, remission. 2. Pernicious anemia. 3. Vitiligo.  Current Therapy:    Vitamin B12 1 mg IM Q. month and     Interim History:  Ms.  Pace is back for followup. She has a little sinus infection right now. She's having a lot of green is discharged.  We will go ahead and get her on some antibiotic. I I may just put her on erythromycin as she has allergies to penicillin and sulfa.  She's had no problems with bleeding or bruising. She's had no fever sweats or chills. She's had no abdominal pain. There's been no cough. She's had no further problems outside of her ankles.    Medications: Current outpatient prescriptions:acetaminophen (TYLENOL) 325 MG tablet, Take 650 mg by mouth every 6 (six) hours as needed.  , Disp: , Rfl: ;  CALCIUM-VITAMIN D PO, Take 1 capsule by mouth every morning. Calcium 600 mg  Vitamin D 200, Disp: , Rfl: ;  Cholecalciferol (VITAMIN D) 2000 UNITS tablet, Take 2,000 Units by mouth daily., Disp: , Rfl: ;  cyanocobalamin (,VITAMIN B-12,) 1000 MCG/ML injection, INJECT 1 ML ONCE MONTHLY, Disp: 10 mL, Rfl: 0 Denosumab (PROLIA Channing), Inject into the skin every 6 (six) months., Disp: , Rfl: ;  diphenhydrAMINE (ALLERGY) 25 MG tablet, Take 25 mg by mouth every 6 (six) hours as needed., Disp: , Rfl: ;  esomeprazole (NEXIUM) 40 MG capsule, Take by mouth daily before breakfast. OVER THE COUNTER 20 MG., Disp: , Rfl: ;  Krill Oil 500 MG CAPS, Take by mouth every morning., Disp: , Rfl:  levothyroxine (SYNTHROID) 137 MCG tablet, Take 137 mcg by mouth daily before breakfast., Disp: , Rfl: ;  sertraline (ZOLOFT) 50 MG tablet, Take 50 mg by mouth daily.  , Disp: , Rfl: ;  UNABLE TO FIND, Inhale into the lungs every morning. Takes new inhaler "BREA", Disp: , Rfl:   Allergies:  Allergies  Allergen Reactions  . Iodinated Diagnostic Agents    . Penicillins   . Quinine Derivatives   . Sulfa Antibiotics     Past Medical History, Surgical history, Social history, and Family History were reviewed and updated.  Review of Systems: As above  Physical Exam:  height is 5\' 5"  (1.651 m) and weight is 142 lb (64.411 kg). Her oral temperature is 98.2 F (36.8 C). Her blood pressure is 173/54 and her pulse is 70. Her respiration is 14.   Gen. well-nourished white female. Head and neck exam shows some slight tenderness over the sinuses. No adenopathy noted in the neck. There is no oral lesions. Lungs are clear. She is aroused wheezes or rhonchi. Cardiac exam regular rate and rhythm with no murmurs rubs or bruits. Abdomen is soft. She has good bowel sounds present well-healed splenectomy scar. There is no palpable hepatomegaly. Extremities shows Oster 3 changes. Skin exam shows a vitiligo. Neurological exam is nonfocal.  Lab Results  Component Value Date   WBC 13.1* 01/26/2014   HGB 13.5 01/26/2014   HCT 39.9 01/26/2014   MCV 98 01/26/2014   PLT 322 01/26/2014     Chemistry      Component Value Date/Time   NA 137 07/18/2009 1039   K 4.2 07/18/2009 1039   CL 103 07/18/2009 1039   CO2 27 07/18/2009 1039   BUN 11 07/18/2009 1039   CREATININE 0.63 07/18/2009 1039      Component  Value Date/Time   CALCIUM 9.3 07/18/2009 1039   ALKPHOS 72 07/18/2009 1039   AST 19 07/18/2009 1039   ALT 14 07/18/2009 1039   BILITOT 0.6 07/18/2009 1039         Impression and Plan: Kathleen Pace is 73 year old female. Has multiple autoimmune disorders. So far, she's been doing well with these.  We will continue see her back in 6 months.   Volanda Napoleon, MD 4/9/20151:12 PM

## 2014-07-27 ENCOUNTER — Ambulatory Visit: Payer: PRIVATE HEALTH INSURANCE | Admitting: Hematology & Oncology

## 2014-07-27 ENCOUNTER — Other Ambulatory Visit: Payer: PRIVATE HEALTH INSURANCE | Admitting: Lab

## 2014-08-02 ENCOUNTER — Telehealth: Payer: Self-pay | Admitting: Hematology & Oncology

## 2014-08-02 NOTE — Telephone Encounter (Signed)
10-8 no show r/s to 11-4

## 2014-08-21 ENCOUNTER — Telehealth: Payer: Self-pay | Admitting: Hematology & Oncology

## 2014-08-21 NOTE — Telephone Encounter (Signed)
Patient called and cx 08/23/14 and resch for 09/25/14

## 2014-08-23 ENCOUNTER — Ambulatory Visit: Payer: PRIVATE HEALTH INSURANCE | Admitting: Hematology & Oncology

## 2014-08-23 ENCOUNTER — Other Ambulatory Visit: Payer: PRIVATE HEALTH INSURANCE | Admitting: Lab

## 2014-09-25 ENCOUNTER — Other Ambulatory Visit (HOSPITAL_BASED_OUTPATIENT_CLINIC_OR_DEPARTMENT_OTHER): Payer: Medicare HMO | Admitting: Lab

## 2014-09-25 ENCOUNTER — Ambulatory Visit (HOSPITAL_BASED_OUTPATIENT_CLINIC_OR_DEPARTMENT_OTHER): Payer: Medicare HMO | Admitting: Family

## 2014-09-25 ENCOUNTER — Encounter: Payer: Self-pay | Admitting: Family

## 2014-09-25 DIAGNOSIS — M359 Systemic involvement of connective tissue, unspecified: Secondary | ICD-10-CM

## 2014-09-25 DIAGNOSIS — D510 Vitamin B12 deficiency anemia due to intrinsic factor deficiency: Secondary | ICD-10-CM

## 2014-09-25 DIAGNOSIS — D693 Immune thrombocytopenic purpura: Secondary | ICD-10-CM

## 2014-09-25 LAB — CBC WITH DIFFERENTIAL (CANCER CENTER ONLY)
BASO#: 0 10*3/uL (ref 0.0–0.2)
BASO%: 0.4 % (ref 0.0–2.0)
EOS%: 3.1 % (ref 0.0–7.0)
Eosinophils Absolute: 0.3 10*3/uL (ref 0.0–0.5)
HCT: 39.1 % (ref 34.8–46.6)
HGB: 13 g/dL (ref 11.6–15.9)
LYMPH#: 4.5 10*3/uL — ABNORMAL HIGH (ref 0.9–3.3)
LYMPH%: 52.9 % — AB (ref 14.0–48.0)
MCH: 33.8 pg (ref 26.0–34.0)
MCHC: 33.2 g/dL (ref 32.0–36.0)
MCV: 102 fL — ABNORMAL HIGH (ref 81–101)
MONO#: 1.1 10*3/uL — ABNORMAL HIGH (ref 0.1–0.9)
MONO%: 12.5 % (ref 0.0–13.0)
NEUT%: 31.1 % — ABNORMAL LOW (ref 39.6–80.0)
NEUTROS ABS: 2.6 10*3/uL (ref 1.5–6.5)
PLATELETS: 434 10*3/uL — AB (ref 145–400)
RBC: 3.85 10*6/uL (ref 3.70–5.32)
RDW: 12.5 % (ref 11.1–15.7)
WBC: 8.4 10*3/uL (ref 3.9–10.0)

## 2014-09-25 LAB — CHCC SATELLITE - SMEAR

## 2014-09-25 MED ORDER — CYANOCOBALAMIN 1000 MCG/ML IJ SOLN: INTRAMUSCULAR | Status: DC

## 2014-09-25 NOTE — Progress Notes (Signed)
[place]  [phone] [phone]  ID: Vista Lawman OB: 1941-05-13 MR#: [id number]#:[id number] Patient Care Team: Valaria Good. Karle Starch, MD as PCP - General (Internal Medicine)  DIAGNOSIS: 1. Chronic immune thrombocytopenia, remission 2. Pernicious anemia 3. Vitiligo  INTERVAL HISTORY: Ms. Kathleen Pace is here today for a follow-up. She is doing ok. She has a lot of stress at home right now. She takes care of her 73 yo mother and her son is also having some issues.  She does feel tired at times but attributes that to the stress.  She denies fever, chills, n/v, cough, rash, sweats, headache, dizziness, SOB, chest pain, palpitations, abdominal pain, constipation, diarrhea, blood in urine or stool. No bleeding or pain. No swelling, tenderness, numbness or tingling in her extremities.  He appetite is good and she is drinking plenty of fluids. Her weight is stable.   CURRENT TREATMENT: Vitamin B12 1 mg IM Q. month (self-administered)  REVIEW OF SYSTEMS: All other 10 point review of systems is negative.   PAST MEDICAL HISTORY: No past medical history on file.  PAST SURGICAL HISTORY: No past surgical history on file.  FAMILY HISTORY No family history on file.  GYNECOLOGIC HISTORY:  No LMP recorded.   SOCIAL HISTORY: History   Social History  . Marital Status: Married    Spouse Name: N/A    Number of Children: N/A  . Years of Education: N/A   Occupational History  . Not on file.   Social History Main Topics  . Smoking status: Former Smoker -- 1.50 packs/day for 32 years    Types: Cigarettes    Start date: 03/28/1962    Quit date: 09/27/1994  . Smokeless tobacco: Never Used     Comment: quit 19 years ago  . Alcohol Use: Not on file  . Drug Use: Not on file  . Sexual Activity: Not on file   Other Topics Concern  . Not on file   Social History Narrative    ADVANCED DIRECTIVES:  <no information>  HEALTH MAINTENANCE: History   Substance Use Topics  . Smoking status: Former Smoker -- 1.50 packs/day for 32 years    Types: Cigarettes    Start date: 03/28/1962    Quit date: 09/27/1994  . Smokeless tobacco: Never Used     Comment: quit 19 years ago  . Alcohol Use: Not on file   Colonoscopy: PAP: Bone density: Lipid panel:  Allergies  Allergen Reactions  . Iodinated Diagnostic Agents   . Penicillins   . Quinine Derivatives   . Sulfa Antibiotics     Current Outpatient Prescriptions  Medication Sig Dispense Refill  . acetaminophen (TYLENOL) 325 MG tablet Take 650 mg by mouth every 6 (six) hours as needed.      Marland Kitchen CALCIUM-VITAMIN D PO Take 1 capsule by mouth every morning. Calcium 600 mg  Vitamin D 200    . Cholecalciferol (VITAMIN D) 2000 UNITS tablet Take 2,000 Units by mouth daily.    . cyanocobalamin (,VITAMIN B-12,) 1000 MCG/ML injection INJECT 1 ML ONCE MONTHLY 10 mL 0  . Denosumab (PROLIA [place]) Inject into the skin every 6 (six) months.    . esomeprazole (NEXIUM) 40 MG capsule Take by mouth daily before breakfast. OVER THE COUNTER 20 MG.    Javier Docker Oil 500 MG CAPS Take by mouth every morning.    Marland Kitchen levothyroxine (SYNTHROID) 137 MCG tablet Take 137 mcg by mouth daily before breakfast.    . lisinopril (PRINIVIL,ZESTRIL) 40 MG tablet  Take 40 mg by mouth daily.    . sertraline (ZOLOFT) 50 MG tablet Take 50 mg by mouth daily.      Marland Kitchen UNABLE TO FIND Inhale into the lungs every morning. Takes new inhaler "BREA"     No current facility-administered medications for this visit.    OBJECTIVE: Filed Vitals:   09/25/14 1051  BP: 148/64  Pulse: 64  Temp: 97.9 F (36.6 C)  Resp: 14    Filed Weights   09/25/14 1051  Weight: 137 lb (62.143 kg)   ECOG FS:0 - Asymptomatic Ocular: Sclerae unicteric, pupils equal, round and reactive to light Ear-nose-throat: Oropharynx clear, dentition fair Lymphatic: No cervical or supraclavicular adenopathy Lungs no rales or rhonchi, good excursion bilaterally Heart regular  rate and rhythm, no murmur appreciated Abd soft, nontender, positive bowel sounds MSK no focal spinal tenderness, no joint edema Neuro: non-focal, well-oriented, appropriate affect Breasts: Deferred  LAB RESULTS: CMP     Component Value Date/Time   NA 137 07/18/2009 1039   K 4.2 07/18/2009 1039   CL 103 07/18/2009 1039   CO2 27 07/18/2009 1039   GLUCOSE 70 07/18/2009 1039   BUN 11 07/18/2009 1039   CREATININE 0.63 07/18/2009 1039   CALCIUM 9.3 07/18/2009 1039   PROT 6.7 07/18/2009 1039   ALBUMIN 4.3 07/18/2009 1039   AST 19 07/18/2009 1039   ALT 14 07/18/2009 1039   ALKPHOS 72 07/18/2009 1039   BILITOT 0.6 07/18/2009 1039   INo results found for: SPEP, UPEP Lab Results  Component Value Date   WBC 8.4 09/25/2014   NEUTROABS 2.6 09/25/2014   HGB 13.0 09/25/2014   HCT 39.1 09/25/2014   MCV 102* 09/25/2014   PLT 434* 09/25/2014   No results found for: LABCA2 No components found for: [id number] No results for input(s): INR in the last 168 hours. Urinalysis No results found for: COLORURINE, APPEARANCEUR, LABSPEC, PHURINE, GLUCOSEU, HGBUR, BILIRUBINUR, KETONESUR, PROTEINUR, UROBILINOGEN, NITRITE, LEUKOCYTESUR STUDIES: No results found.  ASSESSMENT/PLAN: Ms. Eckles is 73 year old female with multiple autoimmune disorders. She has pernicious anemia. She is asymptomatic at this time.  She will continue giving herself the B 12 injections monthly.  Her Hgb is 13.0 MCV 102 platelets 434. We will see her back in 8 months for labs and follow-up.  She knows to call here with any questions or concerns and to go to the ED in the event of an emergency.We can certainly see her sooner if need be.    Eliezer Bottom, NP 09/25/2014 11:37 AM

## 2015-05-28 ENCOUNTER — Encounter: Payer: Self-pay | Admitting: Hematology & Oncology

## 2015-05-28 ENCOUNTER — Ambulatory Visit (HOSPITAL_BASED_OUTPATIENT_CLINIC_OR_DEPARTMENT_OTHER): Payer: Medicare Other | Admitting: Hematology & Oncology

## 2015-05-28 ENCOUNTER — Other Ambulatory Visit (HOSPITAL_BASED_OUTPATIENT_CLINIC_OR_DEPARTMENT_OTHER): Payer: Medicare Other

## 2015-05-28 VITALS — BP 137/54 | HR 59 | Temp 98.0°F | Resp 16 | Ht 65.0 in | Wt 130.0 lb

## 2015-05-28 DIAGNOSIS — D510 Vitamin B12 deficiency anemia due to intrinsic factor deficiency: Secondary | ICD-10-CM

## 2015-05-28 DIAGNOSIS — D693 Immune thrombocytopenic purpura: Secondary | ICD-10-CM | POA: Diagnosis not present

## 2015-05-28 LAB — CBC WITH DIFFERENTIAL (CANCER CENTER ONLY)
BASO#: 0 10*3/uL (ref 0.0–0.2)
BASO%: 0.4 % (ref 0.0–2.0)
EOS%: 2.5 % (ref 0.0–7.0)
Eosinophils Absolute: 0.2 10*3/uL (ref 0.0–0.5)
HEMATOCRIT: 38.2 % (ref 34.8–46.6)
HEMOGLOBIN: 12.8 g/dL (ref 11.6–15.9)
LYMPH#: 4.2 10*3/uL — ABNORMAL HIGH (ref 0.9–3.3)
LYMPH%: 51.1 % — ABNORMAL HIGH (ref 14.0–48.0)
MCH: 33.4 pg (ref 26.0–34.0)
MCHC: 33.5 g/dL (ref 32.0–36.0)
MCV: 100 fL (ref 81–101)
MONO#: 1.1 10*3/uL — ABNORMAL HIGH (ref 0.1–0.9)
MONO%: 13.7 % — ABNORMAL HIGH (ref 0.0–13.0)
NEUT%: 32.3 % — ABNORMAL LOW (ref 39.6–80.0)
NEUTROS ABS: 2.7 10*3/uL (ref 1.5–6.5)
Platelets: 352 10*3/uL (ref 145–400)
RBC: 3.83 10*6/uL (ref 3.70–5.32)
RDW: 13.4 % (ref 11.1–15.7)
WBC: 8.3 10*3/uL (ref 3.9–10.0)

## 2015-05-28 LAB — CHCC SATELLITE - SMEAR

## 2015-05-28 NOTE — Progress Notes (Addendum)
Hematology and Oncology Follow Up Visit  Kathleen Pace [id number] December 25, 1940 74 y.o. 05/28/2015   Principle Diagnosis:  Chronic immune thrombocytopenia, remission. 2. Pernicious anemia. 3. Vitiligo.  Current Therapy:    Vitamin B12 1 mg IM Q. month and     Interim History:  Ms.  Fredman is back for followup.she is doing okay physically. Mentally, she is under a lot stress. His son of hers is in jail and probably will not come out as a freeman. He was driving drunk and she'll somebody.  Outside of that, she is doing okay. She has had no problems with bleeding or bruising.  She's had no issues with infections. She's had no nausea or vomiting.   there's been no leg swelling.  She has the vitiligo which has been about the same. She wears a lot of sunscreen outside.  She has not noted any problems with fever.   Overall, her performance status is ECOG 1.    Medications:  Current outpatient prescriptions:  .  acetaminophen (TYLENOL) 325 MG tablet, Take 650 mg by mouth every 6 (six) hours as needed.  , Disp: , Rfl:  .  CALCIUM-VITAMIN D PO, Take 1 capsule by mouth every morning. Calcium 600 mg  Vitamin D 200, Disp: , Rfl:  .  Cholecalciferol (VITAMIN D) 2000 UNITS tablet, Take 2,000 Units by mouth daily., Disp: , Rfl:  .  cyanocobalamin (,VITAMIN B-12,) 1000 MCG/ML injection, INJECT 1 ML ONCE MONTHLY, Disp: 12 mL, Rfl: 0 .  Denosumab (PROLIA [place]), Inject into the skin every 6 (six) months., Disp: , Rfl:  .  esomeprazole (NEXIUM) 40 MG capsule, Take by mouth daily before breakfast. OVER THE COUNTER 20 MG., Disp: , Rfl:  .  Krill Oil 500 MG CAPS, Take by mouth every morning., Disp: , Rfl:  .  levothyroxine (SYNTHROID) 137 MCG tablet, Take 125 mcg by mouth daily before breakfast. , Disp: , Rfl:  .  lisinopril (PRINIVIL,ZESTRIL) 40 MG tablet, Take 40 mg by mouth daily., Disp: , Rfl:  .  sertraline (ZOLOFT) 50 MG tablet, Take 50 mg by mouth daily.  , Disp: , Rfl:  .  UNABLE TO FIND,  Inhale into the lungs every morning. Takes new inhaler "BREA", Disp: , Rfl:   Allergies:  Allergies  Allergen Reactions  . Iodinated Diagnostic Agents   . Penicillins   . Quinine Derivatives   . Sulfa Antibiotics     Past Medical History, Surgical history, Social history, and Family History were reviewed and updated.  Review of Systems: As above  Physical Exam:  height is 5\' 5"  (1.651 m) and weight is 130 lb (58.968 kg). Her oral temperature is 98 F (36.7 C). Her blood pressure is 137/54 and her pulse is 59. Her respiration is 16.   Gen. well-nourished white female. Head and neck exam shows some slight tenderness over the sinuses. No adenopathy noted in the neck. There is no oral lesions. Lungs are clear. She is aroused wheezes or rhonchi. Cardiac exam regular rate and rhythm with no murmurs rubs or bruits. Abdomen is soft. She has good bowel sounds present well-healed splenectomy scar. There is no palpable hepatomegaly. Extremities shows osteoarthritic changes that are age related.. Skin exam shows vitiligo. There are no rashes, ecchymoses or petechia. Neurological exam is nonfocal.  Lab Results  Component Value Date   WBC 8.3 05/28/2015   HGB 12.8 05/28/2015   HCT 38.2 05/28/2015   MCV 100 05/28/2015   PLT 352 05/28/2015  Chemistry      Component Value Date/Time   NA 137 07/18/2009 1039   K 4.2 07/18/2009 1039   CL 103 07/18/2009 1039   CO2 27 07/18/2009 1039   BUN 11 07/18/2009 1039   CREATININE 0.63 07/18/2009 1039      Component Value Date/Time   CALCIUM 9.3 07/18/2009 1039   ALKPHOS 72 07/18/2009 1039   AST 19 07/18/2009 1039   ALT 14 07/18/2009 1039   BILITOT 0.6 07/18/2009 1039         Impression and Plan: [name] is 74 year old female. Has multiple autoimmune disorders. So far, she's been doing well with these.  I don't see any indication for doing extra blood work her scans for right now.  She does have an increased amount lymphocytes. She's  had this for about a year. I do not worry about this for right now. We had done flow cytometry on this in the past. There was no obvious monoclonal population of cells.  She is at high-risk for CLL or low-grade lymphoma but again, she is asymptomatic at this point and we do not need to "go fishing" for trouble. If something does arise, then we will act on it.   We will continue see her back in 6 months.   Volanda Napoleon, MD 8/8/201610:46 AM

## 2015-10-16 DIAGNOSIS — F411 Generalized anxiety disorder: Secondary | ICD-10-CM | POA: Insufficient documentation

## 2015-10-16 DIAGNOSIS — E039 Hypothyroidism, unspecified: Secondary | ICD-10-CM | POA: Insufficient documentation

## 2015-10-16 DIAGNOSIS — I10 Essential (primary) hypertension: Secondary | ICD-10-CM | POA: Insufficient documentation

## 2015-10-16 DIAGNOSIS — J439 Emphysema, unspecified: Secondary | ICD-10-CM | POA: Insufficient documentation

## 2015-11-22 DIAGNOSIS — M810 Age-related osteoporosis without current pathological fracture: Secondary | ICD-10-CM | POA: Diagnosis not present

## 2015-11-28 ENCOUNTER — Other Ambulatory Visit (HOSPITAL_BASED_OUTPATIENT_CLINIC_OR_DEPARTMENT_OTHER): Payer: PPO

## 2015-11-28 ENCOUNTER — Encounter: Payer: Self-pay | Admitting: Hematology & Oncology

## 2015-11-28 ENCOUNTER — Ambulatory Visit (HOSPITAL_BASED_OUTPATIENT_CLINIC_OR_DEPARTMENT_OTHER): Payer: PPO | Admitting: Hematology & Oncology

## 2015-11-28 ENCOUNTER — Other Ambulatory Visit: Payer: Self-pay | Admitting: *Deleted

## 2015-11-28 VITALS — BP 138/70 | HR 61 | Temp 98.2°F | Resp 16 | Ht 65.0 in | Wt 133.0 lb

## 2015-11-28 DIAGNOSIS — D510 Vitamin B12 deficiency anemia due to intrinsic factor deficiency: Secondary | ICD-10-CM

## 2015-11-28 DIAGNOSIS — D508 Other iron deficiency anemias: Secondary | ICD-10-CM

## 2015-11-28 DIAGNOSIS — M359 Systemic involvement of connective tissue, unspecified: Secondary | ICD-10-CM | POA: Diagnosis not present

## 2015-11-28 DIAGNOSIS — K909 Intestinal malabsorption, unspecified: Secondary | ICD-10-CM

## 2015-11-28 DIAGNOSIS — D509 Iron deficiency anemia, unspecified: Secondary | ICD-10-CM

## 2015-11-28 DIAGNOSIS — D693 Immune thrombocytopenic purpura: Secondary | ICD-10-CM

## 2015-11-28 HISTORY — DX: Intestinal malabsorption, unspecified: K90.9

## 2015-11-28 HISTORY — DX: Iron deficiency anemia, unspecified: D50.9

## 2015-11-28 LAB — CBC WITH DIFFERENTIAL (CANCER CENTER ONLY)
BASO#: 0 10*3/uL (ref 0.0–0.2)
BASO%: 0.5 % (ref 0.0–2.0)
EOS%: 1.5 % (ref 0.0–7.0)
Eosinophils Absolute: 0.1 10*3/uL (ref 0.0–0.5)
HEMATOCRIT: 35.8 % (ref 34.8–46.6)
HGB: 11.7 g/dL (ref 11.6–15.9)
LYMPH#: 3.5 10*3/uL — ABNORMAL HIGH (ref 0.9–3.3)
LYMPH%: 44.7 % (ref 14.0–48.0)
MCH: 31.5 pg (ref 26.0–34.0)
MCHC: 32.7 g/dL (ref 32.0–36.0)
MCV: 97 fL (ref 81–101)
MONO#: 1.2 10*3/uL — ABNORMAL HIGH (ref 0.1–0.9)
MONO%: 15.3 % — AB (ref 0.0–13.0)
NEUT#: 2.9 10*3/uL (ref 1.5–6.5)
NEUT%: 38 % — AB (ref 39.6–80.0)
PLATELETS: 342 10*3/uL (ref 145–400)
RBC: 3.71 10*6/uL (ref 3.70–5.32)
RDW: 14.6 % (ref 11.1–15.7)
WBC: 7.8 10*3/uL (ref 3.9–10.0)

## 2015-11-28 LAB — COMPREHENSIVE METABOLIC PANEL
ALT: 14 U/L (ref 0–55)
AST: 19 U/L (ref 5–34)
Albumin: 3.4 g/dL — ABNORMAL LOW (ref 3.5–5.0)
Alkaline Phosphatase: 72 U/L (ref 40–150)
Anion Gap: 9 mEq/L (ref 3–11)
BILIRUBIN TOTAL: 0.39 mg/dL (ref 0.20–1.20)
BUN: 14.4 mg/dL (ref 7.0–26.0)
CALCIUM: 9.1 mg/dL (ref 8.4–10.4)
CHLORIDE: 104 meq/L (ref 98–109)
CO2: 25 meq/L (ref 22–29)
CREATININE: 0.7 mg/dL (ref 0.6–1.1)
EGFR: 87 mL/min/{1.73_m2} — ABNORMAL LOW (ref >90)
Glucose: 84 mg/dl (ref 70–140)
POTASSIUM: 4.2 meq/L (ref 3.5–5.1)
Sodium: 138 mEq/L (ref 136–145)
Total Protein: 6.7 g/dL (ref 6.4–8.3)

## 2015-11-28 LAB — FERRITIN: Ferritin: 14 ng/ml (ref 9–269)

## 2015-11-28 LAB — IRON AND TIBC
%SAT: 15 % — ABNORMAL LOW (ref 21–57)
IRON: 47 ug/dL (ref 41–142)
TIBC: 316 ug/dL (ref 236–444)
UIBC: 270 ug/dL (ref 120–384)

## 2015-11-28 MED ORDER — CYANOCOBALAMIN 1000 MCG/ML IJ SOLN: INTRAMUSCULAR | Status: DC

## 2015-11-28 NOTE — Progress Notes (Signed)
Hematology and Oncology Follow Up Visit  Kathleen Pace [id number] [birth year] 75 y.o. 11/28/2015   Principle Diagnosis:  Chronic immune thrombocytopenia, remission. 2. Pernicious anemia. 3. Vitiligo.  Current Therapy:    Vitamin B12 1 mg IM Q. month      Interim History:  Ms.  Pace is back for followup. she looks quite good. Last saw her 6 months ago. She had a good holiday season.  She's had no problems with cough or shortness of breath. She's had no fatigue or weakness. She's had no abdominal pain.  She's had no change in bowel or bladder habits.  She does get her mammograms routinely.  She's had no obvious bleeding.  His been no rashes.  She does have vitiligo which is part of the autoimmune syndrome that she has. This is really not changed.  Overall, her performance status is ECOG 1.    Medications:  Current outpatient prescriptions:  .  acetaminophen (TYLENOL) 325 MG tablet, Take 650 mg by mouth every 6 (six) hours as needed.  , Disp: , Rfl:  .  CALCIUM-VITAMIN D PO, Take 1 capsule by mouth every morning. Calcium 600 mg  Vitamin D 200, Disp: , Rfl:  .  Cholecalciferol (VITAMIN D) 2000 UNITS tablet, Take 2,000 Units by mouth daily., Disp: , Rfl:  .  cyanocobalamin (,VITAMIN B-12,) 1000 MCG/ML injection, INJECT 1 ML ONCE MONTHLY, Disp: 12 mL, Rfl: 0 .  Denosumab (PROLIA ), Inject into the skin every 6 (six) months., Disp: , Rfl:  .  esomeprazole (NEXIUM) 40 MG capsule, Take by mouth daily before breakfast. OVER THE COUNTER 20 MG., Disp: , Rfl:  .  Krill Oil 500 MG CAPS, Take by mouth every morning., Disp: , Rfl:  .  levothyroxine (SYNTHROID) 137 MCG tablet, Take 125 mcg by mouth daily before breakfast. , Disp: , Rfl:  .  lisinopril (PRINIVIL,ZESTRIL) 40 MG tablet, Take 40 mg by mouth daily., Disp: , Rfl:  .  sertraline (ZOLOFT) 50 MG tablet, Take 50 mg by mouth daily.  , Disp: , Rfl:  .  UNABLE TO FIND, Inhale into the lungs every morning. Takes new inhaler  "BREA", Disp: , Rfl:   Allergies:  Allergies  Allergen Reactions  . Iodinated Diagnostic Agents   . Penicillins   . Quinine Derivatives   . Sulfa Antibiotics     Past Medical History, Surgical history, Social history, and Family History were reviewed and updated.  Review of Systems: As above  Physical Exam:  height is 5\' 5"  (1.651 m) and weight is 133 lb (60.328 kg). Her oral temperature is 98.2 F (36.8 C). Her blood pressure is 138/70 and her pulse is 61. Her respiration is 16.   Gen. well-nourished white female. Head and neck exam shows some slight tenderness over the sinuses. No adenopathy noted in the neck. There is no oral lesions. Lungs are clear. She is aroused wheezes or rhonchi. Cardiac exam regular rate and rhythm with no murmurs rubs or bruits. Abdomen is soft. She has good bowel sounds present well-healed splenectomy scar. There is no palpable hepatomegaly. Extremities shows osteoarthritic changes that are age related.. Skin exam shows vitiligo. There are no rashes, ecchymoses or petechia. Neurological exam is nonfocal.  Lab Results  Component Value Date   WBC 7.8 11/28/2015   HGB 11.7 11/28/2015   HCT 35.8 11/28/2015   MCV 97 11/28/2015   PLT 342 11/28/2015     Chemistry      Component Value Date/Time   NA  137 07/18/2009 1039   K 4.2 07/18/2009 1039   CL 103 07/18/2009 1039   CO2 27 07/18/2009 1039   BUN 11 07/18/2009 1039   CREATININE 0.63 07/18/2009 1039      Component Value Date/Time   CALCIUM 9.3 07/18/2009 1039   ALKPHOS 72 07/18/2009 1039   AST 19 07/18/2009 1039   ALT 14 07/18/2009 1039   BILITOT 0.6 07/18/2009 1039         Impression and Plan: Kathleen Pace is 75 year old female. Has multiple autoimmune disorders. So far, she's been doing well with these.  I did note that the hemoglobin is dropping a little bit. Her MCV is a little bit lower. I suspect that she probably is iron deficient. We are checking her iron studies. If so, we will give  her some IV iron.  For now, I will plan to see her back in 6 months.   Volanda Napoleon, MD 2/8/201711:05 AM

## 2015-11-29 ENCOUNTER — Other Ambulatory Visit: Payer: Self-pay | Admitting: *Deleted

## 2015-11-29 ENCOUNTER — Telehealth: Payer: Self-pay | Admitting: *Deleted

## 2015-11-29 ENCOUNTER — Ambulatory Visit: Payer: PPO

## 2015-11-29 DIAGNOSIS — D509 Iron deficiency anemia, unspecified: Secondary | ICD-10-CM

## 2015-11-29 LAB — VITAMIN D 25 HYDROXY (VIT D DEFICIENCY, FRACTURES): VIT D 25 HYDROXY: 37 ng/mL (ref 30.0–100.0)

## 2015-11-29 NOTE — Telephone Encounter (Signed)
-----   Message from Volanda Napoleon, MD sent at 11/28/2015  3:59 PM EST ----- Call - iron level is low!!  plese set up 1 dose of Feraheme in 1-2 week.  Thanks!!  Kathleen Pace

## 2015-11-29 NOTE — Telephone Encounter (Signed)
Patient aware of results. Appointment made

## 2015-12-03 ENCOUNTER — Ambulatory Visit: Payer: PPO

## 2015-12-05 ENCOUNTER — Ambulatory Visit (HOSPITAL_BASED_OUTPATIENT_CLINIC_OR_DEPARTMENT_OTHER): Payer: PPO

## 2015-12-05 VITALS — BP 133/57 | HR 57 | Temp 97.9°F | Resp 18

## 2015-12-05 DIAGNOSIS — D509 Iron deficiency anemia, unspecified: Secondary | ICD-10-CM

## 2015-12-05 MED ORDER — SODIUM CHLORIDE 0.9 % IV SOLN
Freq: Once | INTRAVENOUS | Status: AC
  Administered 2015-12-05: 15:00:00 via INTRAVENOUS

## 2015-12-05 MED ORDER — SODIUM CHLORIDE 0.9 % IV SOLN
510.0000 mg | Freq: Once | INTRAVENOUS | Status: AC
  Administered 2015-12-05: 510 mg via INTRAVENOUS
  Filled 2015-12-05: qty 17

## 2015-12-05 NOTE — Patient Instructions (Signed)

## 2015-12-14 ENCOUNTER — Telehealth: Payer: Self-pay | Admitting: Hematology & Oncology

## 2015-12-14 NOTE — Telephone Encounter (Signed)
Brien [id number] Treating Provider: Dr. Burney Gauze Visits: 12 Status: Approved Dates: 12/04/2015 - 06/03/2016     [id number] - Iron      COPY SCANNED

## 2015-12-17 DIAGNOSIS — R05 Cough: Secondary | ICD-10-CM | POA: Diagnosis not present

## 2015-12-17 DIAGNOSIS — R062 Wheezing: Secondary | ICD-10-CM | POA: Diagnosis not present

## 2015-12-17 DIAGNOSIS — J0100 Acute maxillary sinusitis, unspecified: Secondary | ICD-10-CM | POA: Diagnosis not present

## 2015-12-17 DIAGNOSIS — J440 Chronic obstructive pulmonary disease with acute lower respiratory infection: Secondary | ICD-10-CM | POA: Diagnosis not present

## 2015-12-26 DIAGNOSIS — H5211 Myopia, right eye: Secondary | ICD-10-CM | POA: Diagnosis not present

## 2015-12-26 DIAGNOSIS — H04123 Dry eye syndrome of bilateral lacrimal glands: Secondary | ICD-10-CM | POA: Diagnosis not present

## 2015-12-30 DIAGNOSIS — J440 Chronic obstructive pulmonary disease with acute lower respiratory infection: Secondary | ICD-10-CM | POA: Diagnosis not present

## 2015-12-30 DIAGNOSIS — R05 Cough: Secondary | ICD-10-CM | POA: Diagnosis not present

## 2015-12-30 DIAGNOSIS — R0602 Shortness of breath: Secondary | ICD-10-CM | POA: Diagnosis not present

## 2015-12-30 DIAGNOSIS — J069 Acute upper respiratory infection, unspecified: Secondary | ICD-10-CM | POA: Diagnosis not present

## 2016-02-07 DIAGNOSIS — J449 Chronic obstructive pulmonary disease, unspecified: Secondary | ICD-10-CM | POA: Diagnosis not present

## 2016-05-23 DIAGNOSIS — M4134 Thoracogenic scoliosis, thoracic region: Secondary | ICD-10-CM | POA: Diagnosis not present

## 2016-05-23 DIAGNOSIS — E538 Deficiency of other specified B group vitamins: Secondary | ICD-10-CM | POA: Insufficient documentation

## 2016-05-23 DIAGNOSIS — G8929 Other chronic pain: Secondary | ICD-10-CM | POA: Insufficient documentation

## 2016-05-23 DIAGNOSIS — R200 Anesthesia of skin: Secondary | ICD-10-CM | POA: Insufficient documentation

## 2016-05-23 DIAGNOSIS — K219 Gastro-esophageal reflux disease without esophagitis: Secondary | ICD-10-CM | POA: Insufficient documentation

## 2016-05-23 DIAGNOSIS — E039 Hypothyroidism, unspecified: Secondary | ICD-10-CM | POA: Diagnosis not present

## 2016-05-23 DIAGNOSIS — M810 Age-related osteoporosis without current pathological fracture: Secondary | ICD-10-CM | POA: Diagnosis not present

## 2016-05-23 DIAGNOSIS — M546 Pain in thoracic spine: Secondary | ICD-10-CM

## 2016-05-23 DIAGNOSIS — I10 Essential (primary) hypertension: Secondary | ICD-10-CM | POA: Diagnosis not present

## 2016-05-23 DIAGNOSIS — F411 Generalized anxiety disorder: Secondary | ICD-10-CM | POA: Diagnosis not present

## 2016-05-26 DIAGNOSIS — I10 Essential (primary) hypertension: Secondary | ICD-10-CM | POA: Diagnosis not present

## 2016-05-27 ENCOUNTER — Ambulatory Visit: Payer: PPO | Admitting: Hematology & Oncology

## 2016-05-27 ENCOUNTER — Other Ambulatory Visit: Payer: PPO

## 2016-06-16 DIAGNOSIS — S22000A Wedge compression fracture of unspecified thoracic vertebra, initial encounter for closed fracture: Secondary | ICD-10-CM | POA: Diagnosis not present

## 2016-06-16 DIAGNOSIS — R200 Anesthesia of skin: Secondary | ICD-10-CM | POA: Diagnosis not present

## 2016-06-26 ENCOUNTER — Ambulatory Visit: Payer: PPO | Admitting: Hematology & Oncology

## 2016-06-26 ENCOUNTER — Other Ambulatory Visit: Payer: PPO

## 2016-07-02 DIAGNOSIS — G8929 Other chronic pain: Secondary | ICD-10-CM | POA: Diagnosis not present

## 2016-07-02 DIAGNOSIS — M546 Pain in thoracic spine: Secondary | ICD-10-CM | POA: Diagnosis not present

## 2016-07-02 DIAGNOSIS — M4134 Thoracogenic scoliosis, thoracic region: Secondary | ICD-10-CM | POA: Diagnosis not present

## 2016-07-02 DIAGNOSIS — M5124 Other intervertebral disc displacement, thoracic region: Secondary | ICD-10-CM | POA: Diagnosis not present

## 2016-07-31 ENCOUNTER — Other Ambulatory Visit (HOSPITAL_BASED_OUTPATIENT_CLINIC_OR_DEPARTMENT_OTHER): Payer: PPO

## 2016-07-31 ENCOUNTER — Ambulatory Visit (HOSPITAL_BASED_OUTPATIENT_CLINIC_OR_DEPARTMENT_OTHER): Payer: PPO | Admitting: Hematology & Oncology

## 2016-07-31 ENCOUNTER — Encounter: Payer: Self-pay | Admitting: Hematology & Oncology

## 2016-07-31 VITALS — BP 135/70 | HR 68 | Temp 98.0°F | Resp 16 | Ht 65.0 in | Wt 131.8 lb

## 2016-07-31 DIAGNOSIS — D508 Other iron deficiency anemias: Secondary | ICD-10-CM

## 2016-07-31 DIAGNOSIS — K909 Intestinal malabsorption, unspecified: Secondary | ICD-10-CM

## 2016-07-31 DIAGNOSIS — D509 Iron deficiency anemia, unspecified: Secondary | ICD-10-CM | POA: Diagnosis not present

## 2016-07-31 DIAGNOSIS — D510 Vitamin B12 deficiency anemia due to intrinsic factor deficiency: Secondary | ICD-10-CM | POA: Diagnosis not present

## 2016-07-31 DIAGNOSIS — L80 Vitiligo: Secondary | ICD-10-CM

## 2016-07-31 LAB — CBC WITH DIFFERENTIAL (CANCER CENTER ONLY)
BASO#: 0 10*3/uL (ref 0.0–0.2)
BASO%: 0.4 % (ref 0.0–2.0)
EOS ABS: 0.1 10*3/uL (ref 0.0–0.5)
EOS%: 0.7 % (ref 0.0–7.0)
HCT: 41.3 % (ref 34.8–46.6)
HEMOGLOBIN: 14.3 g/dL (ref 11.6–15.9)
LYMPH#: 4.4 10*3/uL — ABNORMAL HIGH (ref 0.9–3.3)
LYMPH%: 53.4 % — ABNORMAL HIGH (ref 14.0–48.0)
MCH: 35.3 pg — AB (ref 26.0–34.0)
MCHC: 34.6 g/dL (ref 32.0–36.0)
MCV: 102 fL — AB (ref 81–101)
MONO#: 1.1 10*3/uL — AB (ref 0.1–0.9)
MONO%: 13.7 % — ABNORMAL HIGH (ref 0.0–13.0)
NEUT%: 31.8 % — ABNORMAL LOW (ref 39.6–80.0)
NEUTROS ABS: 2.6 10*3/uL (ref 1.5–6.5)
Platelets: 393 10*3/uL (ref 145–400)
RBC: 4.05 10*6/uL (ref 3.70–5.32)
RDW: 12.8 % (ref 11.1–15.7)
WBC: 8.2 10*3/uL (ref 3.9–10.0)

## 2016-07-31 LAB — COMPREHENSIVE METABOLIC PANEL
ALBUMIN: 3.7 g/dL (ref 3.5–5.0)
ALK PHOS: 70 U/L (ref 40–150)
ALT: 9 U/L (ref 0–55)
ANION GAP: 8 meq/L (ref 3–11)
AST: 18 U/L (ref 5–34)
BILIRUBIN TOTAL: 0.62 mg/dL (ref 0.20–1.20)
BUN: 14.7 mg/dL (ref 7.0–26.0)
CALCIUM: 9.2 mg/dL (ref 8.4–10.4)
CO2: 27 meq/L (ref 22–29)
CREATININE: 0.7 mg/dL (ref 0.6–1.1)
Chloride: 102 mEq/L (ref 98–109)
EGFR: 81 mL/min/{1.73_m2} — AB (ref >90)
Glucose: 96 mg/dl (ref 70–140)
Potassium: 4.3 mEq/L (ref 3.5–5.1)
Sodium: 138 mEq/L (ref 136–145)
TOTAL PROTEIN: 7.4 g/dL (ref 6.4–8.3)

## 2016-07-31 LAB — FERRITIN: FERRITIN: 46 ng/mL (ref 9–269)

## 2016-07-31 LAB — IRON AND TIBC
%SAT: 43 % (ref 21–57)
Iron: 130 ug/dL (ref 41–142)
TIBC: 304 ug/dL (ref 236–444)
UIBC: 173 ug/dL (ref 120–384)

## 2016-07-31 MED ORDER — CYANOCOBALAMIN 1000 MCG/ML IJ SOLN: INTRAMUSCULAR | 3 refills | Status: DC

## 2016-07-31 NOTE — Progress Notes (Signed)
Hematology and Oncology Follow Up Visit  Kathleen Pace [id number] 1941/07/23 75 y.o. 07/31/2016   Principle Diagnosis:  Chronic immune thrombocytopenia, remission. 2. Pernicious anemia. 3. Vitiligo.  Current Therapy:    Vitamin B12 1 mg IM Q. month      Interim History:  Kathleen Pace is back for followup. she looks quite good. She had a nice summer. She went on a cruise to [place]. She really enjoyed this.   She has had no problems with fever. She's had no infections. She's had no change in bowel or bladder habits. She is had no issues with tingling in the hands or feet. She's had no worsening of the vitiligo.   Her brother is coming in for her 18th wedding anniversary. She is looking for to this.   She's had no issues with cough. She's had no dysphagia or odynophagia.   Overall, her performance status is ECOG 1.  Medications:  Current Outpatient Prescriptions:  .  acetaminophen (TYLENOL) 325 MG tablet, Take 650 mg by mouth every 6 (six) hours as needed.  , Disp: , Rfl:  .  CALCIUM-VITAMIN D PO, Take 1 capsule by mouth every morning. Calcium 600 mg  Vitamin D 200, Disp: , Rfl:  .  Cholecalciferol (VITAMIN D) 2000 UNITS tablet, Take 2,000 Units by mouth daily., Disp: , Rfl:  .  cyanocobalamin (,VITAMIN B-12,) 1000 MCG/ML injection, INJECT 1 ML ONCE MONTHLY, Disp: 25 mL, Rfl: 3 .  Denosumab (PROLIA [place]), Inject into the skin every 6 (six) months., Disp: , Rfl:  .  esomeprazole (NEXIUM) 20 MG capsule, Take 20 mg by mouth daily at 12 noon., Disp: , Rfl:  .  Krill Oil 500 MG CAPS, Take by mouth every morning., Disp: , Rfl:  .  levothyroxine (SYNTHROID, LEVOTHROID) 125 MCG tablet, TAKE 1 AND 1/2 TABLETS 1 DAY WEEKLY, 1 TABLET 6 DAYS WEEKLY, Disp: , Rfl:  .  lisinopril (PRINIVIL,ZESTRIL) 40 MG tablet, Take 40 mg by mouth daily., Disp: , Rfl:  .  sertraline (ZOLOFT) 50 MG tablet, Take 50 mg by mouth daily.  , Disp: , Rfl:  .  UNABLE TO FIND, Inhale into the lungs every morning. Takes  new inhaler "BREA", Disp: , Rfl:   Allergies:  Allergies  Allergen Reactions  . Iodinated Diagnostic Agents   . Penicillins   . Quinine Derivatives   . Sulfa Antibiotics     Past Medical History, Surgical history, Social history, and Family History were reviewed and updated.  Review of Systems: As above  Physical Exam:  height is 5\' 5"  (1.651 m) and weight is 131 lb 12.8 oz (59.8 kg). Her oral temperature is 98 F (36.7 C). Her blood pressure is 135/70 and her pulse is 68. Her respiration is 16.   Gen. well-nourished white female. Head and neck exam shows some slight tenderness over the sinuses. No adenopathy noted in the neck. There is no oral lesions. Lungs are clear. She is aroused wheezes or rhonchi. Cardiac exam regular rate and rhythm with no murmurs rubs or bruits. Abdomen is soft. She has good bowel sounds present well-healed splenectomy scar. There is no palpable hepatomegaly. Extremities shows osteoarthritic changes that are age related.. Skin exam shows vitiligo. There are no rashes, ecchymoses or petechia. Neurological exam is nonfocal.  Lab Results  Component Value Date   WBC 8.2 07/31/2016   HGB 14.3 07/31/2016   HCT 41.3 07/31/2016   MCV 102 (H) 07/31/2016   PLT 393 07/31/2016  Chemistry      Component Value Date/Time   NA 138 11/28/2015 1004   K 4.2 11/28/2015 1004   CL 103 07/18/2009 1039   CO2 25 11/28/2015 1004   BUN 14.4 11/28/2015 1004   CREATININE 0.7 11/28/2015 1004      Component Value Date/Time   CALCIUM 9.1 11/28/2015 1004   ALKPHOS 72 11/28/2015 1004   AST 19 11/28/2015 1004   ALT 14 11/28/2015 1004   BILITOT 0.39 11/28/2015 1004         Impression and Plan: Kathleen Pace is 75 year old female. Has multiple autoimmune disorders. So far, she's been doing well with these.  Everything is looking fantastic. Her hemoglobin is better.  I think we can now get her back in 6 months. I feel comfortable with her coming back in 6 months.    Volanda Napoleon, MD 10/12/201711:18 AM

## 2016-08-19 DIAGNOSIS — H16143 Punctate keratitis, bilateral: Secondary | ICD-10-CM | POA: Diagnosis not present

## 2016-11-24 DIAGNOSIS — Z0000 Encounter for general adult medical examination without abnormal findings: Secondary | ICD-10-CM | POA: Diagnosis not present

## 2016-11-24 DIAGNOSIS — R102 Pelvic and perineal pain: Secondary | ICD-10-CM | POA: Diagnosis not present

## 2016-11-24 DIAGNOSIS — I10 Essential (primary) hypertension: Secondary | ICD-10-CM | POA: Diagnosis not present

## 2016-11-24 DIAGNOSIS — E039 Hypothyroidism, unspecified: Secondary | ICD-10-CM | POA: Diagnosis not present

## 2016-11-24 DIAGNOSIS — Z1211 Encounter for screening for malignant neoplasm of colon: Secondary | ICD-10-CM | POA: Diagnosis not present

## 2016-11-24 DIAGNOSIS — K219 Gastro-esophageal reflux disease without esophagitis: Secondary | ICD-10-CM | POA: Diagnosis not present

## 2016-11-24 DIAGNOSIS — R011 Cardiac murmur, unspecified: Secondary | ICD-10-CM | POA: Diagnosis not present

## 2016-11-24 DIAGNOSIS — F411 Generalized anxiety disorder: Secondary | ICD-10-CM | POA: Diagnosis not present

## 2016-11-24 DIAGNOSIS — Z1231 Encounter for screening mammogram for malignant neoplasm of breast: Secondary | ICD-10-CM | POA: Diagnosis not present

## 2016-11-24 DIAGNOSIS — J439 Emphysema, unspecified: Secondary | ICD-10-CM | POA: Diagnosis not present

## 2016-11-27 DIAGNOSIS — I10 Essential (primary) hypertension: Secondary | ICD-10-CM | POA: Diagnosis not present

## 2016-11-27 DIAGNOSIS — E039 Hypothyroidism, unspecified: Secondary | ICD-10-CM | POA: Diagnosis not present

## 2016-11-27 DIAGNOSIS — E559 Vitamin D deficiency, unspecified: Secondary | ICD-10-CM | POA: Diagnosis not present

## 2016-12-01 DIAGNOSIS — E559 Vitamin D deficiency, unspecified: Secondary | ICD-10-CM | POA: Diagnosis not present

## 2016-12-01 DIAGNOSIS — R011 Cardiac murmur, unspecified: Secondary | ICD-10-CM | POA: Diagnosis not present

## 2016-12-01 DIAGNOSIS — E039 Hypothyroidism, unspecified: Secondary | ICD-10-CM | POA: Diagnosis not present

## 2016-12-01 DIAGNOSIS — M810 Age-related osteoporosis without current pathological fracture: Secondary | ICD-10-CM | POA: Diagnosis not present

## 2016-12-09 DIAGNOSIS — Z1231 Encounter for screening mammogram for malignant neoplasm of breast: Secondary | ICD-10-CM | POA: Diagnosis not present

## 2016-12-09 DIAGNOSIS — M810 Age-related osteoporosis without current pathological fracture: Secondary | ICD-10-CM | POA: Diagnosis not present

## 2016-12-09 DIAGNOSIS — Z780 Asymptomatic menopausal state: Secondary | ICD-10-CM | POA: Diagnosis not present

## 2017-01-01 DIAGNOSIS — J441 Chronic obstructive pulmonary disease with (acute) exacerbation: Secondary | ICD-10-CM | POA: Diagnosis not present

## 2017-01-01 DIAGNOSIS — R05 Cough: Secondary | ICD-10-CM | POA: Diagnosis not present

## 2017-01-21 DIAGNOSIS — J441 Chronic obstructive pulmonary disease with (acute) exacerbation: Secondary | ICD-10-CM | POA: Diagnosis not present

## 2017-01-21 DIAGNOSIS — J439 Emphysema, unspecified: Secondary | ICD-10-CM | POA: Diagnosis not present

## 2017-01-21 DIAGNOSIS — E039 Hypothyroidism, unspecified: Secondary | ICD-10-CM | POA: Diagnosis not present

## 2017-01-29 ENCOUNTER — Ambulatory Visit: Payer: PPO | Admitting: Hematology & Oncology

## 2017-01-29 ENCOUNTER — Other Ambulatory Visit: Payer: PPO

## 2017-02-10 DIAGNOSIS — K648 Other hemorrhoids: Secondary | ICD-10-CM | POA: Diagnosis not present

## 2017-02-10 DIAGNOSIS — K5730 Diverticulosis of large intestine without perforation or abscess without bleeding: Secondary | ICD-10-CM | POA: Diagnosis not present

## 2017-02-10 DIAGNOSIS — Z86010 Personal history of colonic polyps: Secondary | ICD-10-CM | POA: Diagnosis not present

## 2017-02-10 DIAGNOSIS — D123 Benign neoplasm of transverse colon: Secondary | ICD-10-CM | POA: Diagnosis not present

## 2017-02-10 DIAGNOSIS — Z1211 Encounter for screening for malignant neoplasm of colon: Secondary | ICD-10-CM | POA: Diagnosis not present

## 2017-02-10 DIAGNOSIS — K635 Polyp of colon: Secondary | ICD-10-CM | POA: Diagnosis not present

## 2017-02-16 DIAGNOSIS — J449 Chronic obstructive pulmonary disease, unspecified: Secondary | ICD-10-CM | POA: Diagnosis not present

## 2017-02-23 DIAGNOSIS — E039 Hypothyroidism, unspecified: Secondary | ICD-10-CM | POA: Diagnosis not present

## 2017-02-23 DIAGNOSIS — I350 Nonrheumatic aortic (valve) stenosis: Secondary | ICD-10-CM | POA: Diagnosis not present

## 2017-02-23 DIAGNOSIS — F411 Generalized anxiety disorder: Secondary | ICD-10-CM | POA: Diagnosis not present

## 2017-02-23 DIAGNOSIS — Z23 Encounter for immunization: Secondary | ICD-10-CM | POA: Diagnosis not present

## 2017-03-13 DIAGNOSIS — H16143 Punctate keratitis, bilateral: Secondary | ICD-10-CM | POA: Diagnosis not present

## 2017-03-13 DIAGNOSIS — H04123 Dry eye syndrome of bilateral lacrimal glands: Secondary | ICD-10-CM | POA: Diagnosis not present

## 2017-03-13 DIAGNOSIS — H5202 Hypermetropia, left eye: Secondary | ICD-10-CM | POA: Diagnosis not present

## 2017-05-05 DIAGNOSIS — H16143 Punctate keratitis, bilateral: Secondary | ICD-10-CM | POA: Diagnosis not present

## 2017-05-05 DIAGNOSIS — H04123 Dry eye syndrome of bilateral lacrimal glands: Secondary | ICD-10-CM | POA: Diagnosis not present

## 2017-05-20 DIAGNOSIS — H16223 Keratoconjunctivitis sicca, not specified as Sjogren's, bilateral: Secondary | ICD-10-CM | POA: Diagnosis not present

## 2017-05-26 DIAGNOSIS — M3500 Sicca syndrome, unspecified: Secondary | ICD-10-CM | POA: Diagnosis not present

## 2017-06-08 DIAGNOSIS — Z961 Presence of intraocular lens: Secondary | ICD-10-CM | POA: Diagnosis not present

## 2017-06-08 DIAGNOSIS — H16223 Keratoconjunctivitis sicca, not specified as Sjogren's, bilateral: Secondary | ICD-10-CM | POA: Diagnosis not present

## 2017-06-08 DIAGNOSIS — H01009 Unspecified blepharitis unspecified eye, unspecified eyelid: Secondary | ICD-10-CM | POA: Diagnosis not present

## 2017-06-11 ENCOUNTER — Other Ambulatory Visit (HOSPITAL_BASED_OUTPATIENT_CLINIC_OR_DEPARTMENT_OTHER): Payer: PPO

## 2017-06-11 ENCOUNTER — Other Ambulatory Visit: Payer: PPO

## 2017-06-11 ENCOUNTER — Ambulatory Visit (HOSPITAL_BASED_OUTPATIENT_CLINIC_OR_DEPARTMENT_OTHER): Payer: PPO | Admitting: Hematology & Oncology

## 2017-06-11 ENCOUNTER — Ambulatory Visit: Payer: PPO | Admitting: Hematology & Oncology

## 2017-06-11 VITALS — BP 149/72 | HR 61 | Temp 98.3°F | Resp 20 | Wt 136.0 lb

## 2017-06-11 DIAGNOSIS — D509 Iron deficiency anemia, unspecified: Secondary | ICD-10-CM | POA: Diagnosis not present

## 2017-06-11 DIAGNOSIS — Z862 Personal history of diseases of the blood and blood-forming organs and certain disorders involving the immune mechanism: Secondary | ICD-10-CM

## 2017-06-11 DIAGNOSIS — L80 Vitiligo: Secondary | ICD-10-CM

## 2017-06-11 DIAGNOSIS — D510 Vitamin B12 deficiency anemia due to intrinsic factor deficiency: Secondary | ICD-10-CM | POA: Diagnosis not present

## 2017-06-11 DIAGNOSIS — E039 Hypothyroidism, unspecified: Secondary | ICD-10-CM | POA: Diagnosis not present

## 2017-06-11 DIAGNOSIS — K909 Intestinal malabsorption, unspecified: Secondary | ICD-10-CM

## 2017-06-11 DIAGNOSIS — D501 Sideropenic dysphagia: Secondary | ICD-10-CM

## 2017-06-11 DIAGNOSIS — D72820 Lymphocytosis (symptomatic): Secondary | ICD-10-CM | POA: Diagnosis not present

## 2017-06-11 LAB — CBC WITH DIFFERENTIAL (CANCER CENTER ONLY)
BASO#: 0 10*3/uL (ref 0.0–0.2)
BASO%: 0.5 % (ref 0.0–2.0)
EOS%: 0.7 % (ref 0.0–7.0)
Eosinophils Absolute: 0.1 10*3/uL (ref 0.0–0.5)
HEMATOCRIT: 36.3 % (ref 34.8–46.6)
HGB: 12.8 g/dL (ref 11.6–15.9)
LYMPH#: 4.3 10*3/uL — AB (ref 0.9–3.3)
LYMPH%: 53.1 % — AB (ref 14.0–48.0)
MCH: 34.9 pg — ABNORMAL HIGH (ref 26.0–34.0)
MCHC: 35.3 g/dL (ref 32.0–36.0)
MCV: 99 fL (ref 81–101)
MONO#: 1 10*3/uL — ABNORMAL HIGH (ref 0.1–0.9)
MONO%: 11.9 % (ref 0.0–13.0)
NEUT#: 2.7 10*3/uL (ref 1.5–6.5)
NEUT%: 33.8 % — AB (ref 39.6–80.0)
Platelets: 373 10*3/uL (ref 145–400)
RBC: 3.67 10*6/uL — ABNORMAL LOW (ref 3.70–5.32)
RDW: 12.9 % (ref 11.1–15.7)
WBC: 8.1 10*3/uL (ref 3.9–10.0)

## 2017-06-11 LAB — CHCC SATELLITE - SMEAR

## 2017-06-11 LAB — CMP (CANCER CENTER ONLY)
ALT(SGPT): 17 U/L (ref 10–47)
AST: 29 U/L (ref 11–38)
Albumin: 3.3 g/dL (ref 3.3–5.5)
Alkaline Phosphatase: 89 U/L — ABNORMAL HIGH (ref 26–84)
BILIRUBIN TOTAL: 0.6 mg/dL (ref 0.20–1.60)
BUN, Bld: 13 mg/dL (ref 7–22)
CALCIUM: 9.1 mg/dL (ref 8.0–10.3)
CO2: 30 meq/L (ref 18–33)
Chloride: 103 mEq/L (ref 98–108)
Creat: 0.8 mg/dl (ref 0.6–1.2)
GLUCOSE: 100 mg/dL (ref 73–118)
Potassium: 4.4 mEq/L (ref 3.3–4.7)
Sodium: 140 mEq/L (ref 128–145)
Total Protein: 7 g/dL (ref 6.4–8.1)

## 2017-06-11 NOTE — Progress Notes (Signed)
Hematology and Oncology Follow Up Visit  Kathleen Pace [id number] 12-Feb-1941 76 y.o. 06/11/2017   Principle Diagnosis:  Chronic immune thrombocytopenia, remission. 2. Pernicious anemia. 3. Vitiligo.  Current Therapy:    Vitamin B12 1 mg IM Q. month      Interim History:  Ms.  Pace is back for follow-up. She has a new issue. She apparently has been having some ocular problems. She ultimately had some lab work done. She has a markedly elevated ANA titer of 1:1280. She is yet to see her family doctor. I'm sure she'll have to see a rheumatologist.  She's had no bleeding. There's been no bruising. She's had no weight loss or weight gain. She's had no nausea or vomiting. She's had no mouth sores.  She does have hypothyroidism.  She does have vitiligo which has been relatively stable.  Overall, her performance status is ECOG 1.  Medications:  Current Outpatient Prescriptions:  .  albuterol (PROVENTIL HFA;VENTOLIN HFA) 108 (90 Base) MCG/ACT inhaler, Inhale into the lungs., Disp: , Rfl:  .  alendronate (FOSAMAX) 70 MG tablet, Take 1 tablet po weekly.  Stay upright for 1 hour following, Disp: , Rfl:  .  Tiotropium Bromide Monohydrate 2.5 MCG/ACT AERS, Inhale into the lungs., Disp: , Rfl:  .  Tiotropium Bromide-Olodaterol 2.5-2.5 MCG/ACT AERS, Inhale into the lungs., Disp: , Rfl:  .  umeclidinium-vilanterol (ANORO ELLIPTA) 62.5-25 MCG/INH AEPB, Inhale into the lungs., Disp: , Rfl:  .  acetaminophen (TYLENOL) 325 MG tablet, Take 650 mg by mouth every 6 (six) hours as needed.  , Disp: , Rfl:  .  CALCIUM-VITAMIN D PO, Take 1 capsule by mouth every morning. Calcium 600 mg  Vitamin D 200, Disp: , Rfl:  .  Cholecalciferol (VITAMIN D) 2000 UNITS tablet, Take 2,000 Units by mouth daily., Disp: , Rfl:  .  cyanocobalamin (,VITAMIN B-12,) 1000 MCG/ML injection, INJECT 1 ML ONCE MONTHLY, Disp: 25 mL, Rfl: 3 .  Denosumab (PROLIA [place]), Inject into the skin every 6 (six) months., Disp: , Rfl:  .   esomeprazole (NEXIUM) 20 MG capsule, Take 20 mg by mouth daily at 12 noon., Disp: , Rfl:  .  Krill Oil 500 MG CAPS, Take by mouth every morning., Disp: , Rfl:  .  levothyroxine (SYNTHROID, LEVOTHROID) 125 MCG tablet, TAKE 1 AND 1/2 TABLETS 1 DAY WEEKLY, 1 TABLET 6 DAYS WEEKLY, Disp: , Rfl:  .  lisinopril (PRINIVIL,ZESTRIL) 40 MG tablet, Take 40 mg by mouth daily., Disp: , Rfl:  .  sertraline (ZOLOFT) 50 MG tablet, Take 50 mg by mouth daily.  , Disp: , Rfl:  .  UNABLE TO FIND, Inhale into the lungs every morning. Takes new inhaler "BREA", Disp: , Rfl:   Allergies:  Allergies  Allergen Reactions  . Iodinated Diagnostic Agents   . Penicillins   . Quinine Derivatives   . Sulfa Antibiotics     Past Medical History, Surgical history, Social history, and Family History were reviewed and updated.  Review of Systems: As above  Physical Exam:  weight is 136 lb (61.7 kg). Her oral temperature is 98.3 F (36.8 C). Her blood pressure is 149/72 (abnormal) and her pulse is 61. Her respiration is 20 and oxygen saturation is 98%.   Thin but well-nourished white female. Head and neck exam shows no ocular or oral lesions per there are no palpable cervical or supraclavicular lymph nodes. Lungs are clear bilaterally. Cardiac exam regular rate and rhythm with no murmurs, rubs or bruits. Abdomen is soft.  She has good bowel sounds. There is no fluid wave. There is no palpable liver or spleen tip. Back exam shows marked kyphosis. There is no tenderness over the spine, ribs or hips. Extremity shows no clubbing, cyanosis or edema. Skin exam shows no rashes. She does have vitiligo. Neurologic: Exam shows no focal neurological deficit.  Lab Results  Component Value Date   WBC 8.1 06/11/2017   HGB 12.8 06/11/2017   HCT 36.3 06/11/2017   MCV 99 06/11/2017   PLT 373 06/11/2017     Chemistry      Component Value Date/Time   NA 140 06/11/2017 1311   NA 138 07/31/2016 0947   K 4.4 06/11/2017 1311   K 4.3  07/31/2016 0947   CL 103 06/11/2017 1311   CO2 30 06/11/2017 1311   CO2 27 07/31/2016 0947   BUN 13 06/11/2017 1311   BUN 14.7 07/31/2016 0947   CREATININE 0.8 06/11/2017 1311   CREATININE 0.7 07/31/2016 0947      Component Value Date/Time   CALCIUM 9.1 06/11/2017 1311   CALCIUM 9.2 07/31/2016 0947   ALKPHOS 89 (H) 06/11/2017 1311   ALKPHOS 70 07/31/2016 0947   AST 29 06/11/2017 1311   AST 18 07/31/2016 0947   ALT 17 06/11/2017 1311   ALT 9 07/31/2016 0947   BILITOT 0.60 06/11/2017 1311   BILITOT 0.62 07/31/2016 0947         Impression and Plan: Kathleen Pace is 76 year old white female. She has a clear autoimmune issue. She has pernicious anemia. She has vitiligo. She has hypo-thyroidism.  It would not surprise me if she has a collagen vascular issue. I'm sure that she will be referred to a rheumatologist.  I would like to see her back in 3 months. I think with everything going on, we have to keep a close eye on her.  She does have a lymphocytosis which is chronic. We have done so cytometry on her in the past. This did not show a monoclonal liver proliferative disorder.  I spent about 30 minutes with her. I was surprised by the blood tests that she had. I reviewed these with her.   Volanda Napoleon, MD 8/23/20181:46 PM

## 2017-06-12 LAB — IRON AND TIBC
%SAT: 43 % (ref 21–57)
IRON: 117 ug/dL (ref 41–142)
TIBC: 273 ug/dL (ref 236–444)
UIBC: 156 ug/dL (ref 120–384)

## 2017-06-12 LAB — VITAMIN B12: VITAMIN B 12: 547 pg/mL (ref 232–1245)

## 2017-06-12 LAB — RETICULOCYTES: Reticulocyte Count: 1.2 % (ref 0.6–2.6)

## 2017-06-12 LAB — FERRITIN: Ferritin: 34 ng/ml (ref 9–269)

## 2017-06-17 DIAGNOSIS — Z23 Encounter for immunization: Secondary | ICD-10-CM | POA: Diagnosis not present

## 2017-06-17 DIAGNOSIS — E039 Hypothyroidism, unspecified: Secondary | ICD-10-CM | POA: Diagnosis not present

## 2017-06-17 DIAGNOSIS — Z961 Presence of intraocular lens: Secondary | ICD-10-CM | POA: Diagnosis not present

## 2017-06-17 DIAGNOSIS — H01009 Unspecified blepharitis unspecified eye, unspecified eyelid: Secondary | ICD-10-CM | POA: Diagnosis not present

## 2017-06-17 DIAGNOSIS — I350 Nonrheumatic aortic (valve) stenosis: Secondary | ICD-10-CM | POA: Diagnosis not present

## 2017-06-17 DIAGNOSIS — H16223 Keratoconjunctivitis sicca, not specified as Sjogren's, bilateral: Secondary | ICD-10-CM | POA: Diagnosis not present

## 2017-06-17 DIAGNOSIS — M3501 Sicca syndrome with keratoconjunctivitis: Secondary | ICD-10-CM | POA: Diagnosis not present

## 2017-06-17 DIAGNOSIS — I10 Essential (primary) hypertension: Secondary | ICD-10-CM | POA: Diagnosis not present

## 2017-08-06 DIAGNOSIS — K625 Hemorrhage of anus and rectum: Secondary | ICD-10-CM | POA: Diagnosis not present

## 2017-08-06 DIAGNOSIS — R197 Diarrhea, unspecified: Secondary | ICD-10-CM | POA: Diagnosis not present

## 2017-08-18 DIAGNOSIS — J449 Chronic obstructive pulmonary disease, unspecified: Secondary | ICD-10-CM | POA: Diagnosis not present

## 2017-08-25 ENCOUNTER — Other Ambulatory Visit: Payer: Self-pay | Admitting: Hematology & Oncology

## 2017-08-25 DIAGNOSIS — D510 Vitamin B12 deficiency anemia due to intrinsic factor deficiency: Secondary | ICD-10-CM

## 2017-08-27 DIAGNOSIS — Z6825 Body mass index (BMI) 25.0-25.9, adult: Secondary | ICD-10-CM | POA: Diagnosis not present

## 2017-08-27 DIAGNOSIS — M79641 Pain in right hand: Secondary | ICD-10-CM | POA: Diagnosis not present

## 2017-08-27 DIAGNOSIS — H04123 Dry eye syndrome of bilateral lacrimal glands: Secondary | ICD-10-CM | POA: Diagnosis not present

## 2017-08-27 DIAGNOSIS — E663 Overweight: Secondary | ICD-10-CM | POA: Diagnosis not present

## 2017-08-27 DIAGNOSIS — R768 Other specified abnormal immunological findings in serum: Secondary | ICD-10-CM | POA: Diagnosis not present

## 2017-08-27 DIAGNOSIS — M79642 Pain in left hand: Secondary | ICD-10-CM | POA: Diagnosis not present

## 2017-09-11 ENCOUNTER — Other Ambulatory Visit (HOSPITAL_BASED_OUTPATIENT_CLINIC_OR_DEPARTMENT_OTHER): Payer: PPO

## 2017-09-11 ENCOUNTER — Other Ambulatory Visit: Payer: Self-pay

## 2017-09-11 ENCOUNTER — Ambulatory Visit (HOSPITAL_BASED_OUTPATIENT_CLINIC_OR_DEPARTMENT_OTHER): Payer: PPO | Admitting: Family

## 2017-09-11 VITALS — BP 184/65 | HR 60 | Temp 97.5°F | Resp 20 | Wt 140.5 lb

## 2017-09-11 DIAGNOSIS — K909 Intestinal malabsorption, unspecified: Secondary | ICD-10-CM

## 2017-09-11 DIAGNOSIS — D510 Vitamin B12 deficiency anemia due to intrinsic factor deficiency: Secondary | ICD-10-CM | POA: Diagnosis not present

## 2017-09-11 DIAGNOSIS — D501 Sideropenic dysphagia: Secondary | ICD-10-CM | POA: Diagnosis not present

## 2017-09-11 DIAGNOSIS — M2550 Pain in unspecified joint: Secondary | ICD-10-CM

## 2017-09-11 DIAGNOSIS — E538 Deficiency of other specified B group vitamins: Secondary | ICD-10-CM | POA: Diagnosis not present

## 2017-09-11 LAB — CBC WITH DIFFERENTIAL (CANCER CENTER ONLY)
BASO#: 0 10*3/uL (ref 0.0–0.2)
BASO%: 0.3 % (ref 0.0–2.0)
EOS%: 1.5 % (ref 0.0–7.0)
Eosinophils Absolute: 0.1 10*3/uL (ref 0.0–0.5)
HEMATOCRIT: 38.6 % (ref 34.8–46.6)
HGB: 13 g/dL (ref 11.6–15.9)
LYMPH#: 4.2 10*3/uL — AB (ref 0.9–3.3)
LYMPH%: 48.5 % — AB (ref 14.0–48.0)
MCH: 34.4 pg — ABNORMAL HIGH (ref 26.0–34.0)
MCHC: 33.7 g/dL (ref 32.0–36.0)
MCV: 102 fL — AB (ref 81–101)
MONO#: 1.1 10*3/uL — AB (ref 0.1–0.9)
MONO%: 12.3 % (ref 0.0–13.0)
NEUT#: 3.3 10*3/uL (ref 1.5–6.5)
NEUT%: 37.4 % — AB (ref 39.6–80.0)
RBC: 3.78 10*6/uL (ref 3.70–5.32)
RDW: 12.8 % (ref 11.1–15.7)
WBC: 8.7 10*3/uL (ref 3.9–10.0)

## 2017-09-11 LAB — CMP (CANCER CENTER ONLY)
ALT: 20 U/L (ref 10–47)
AST: 28 U/L (ref 11–38)
Albumin: 3.5 g/dL (ref 3.3–5.5)
Alkaline Phosphatase: 102 U/L — ABNORMAL HIGH (ref 26–84)
BILIRUBIN TOTAL: 0.6 mg/dL (ref 0.20–1.60)
BUN: 17 mg/dL (ref 7–22)
CALCIUM: 9 mg/dL (ref 8.0–10.3)
CO2: 28 meq/L (ref 18–33)
Chloride: 104 mEq/L (ref 98–108)
Creat: 0.9 mg/dl (ref 0.6–1.2)
GLUCOSE: 89 mg/dL (ref 73–118)
POTASSIUM: 4.2 meq/L (ref 3.3–4.7)
Sodium: 144 mEq/L (ref 128–145)
Total Protein: 7.3 g/dL (ref 6.4–8.1)

## 2017-09-11 NOTE — Progress Notes (Signed)
Hematology and Oncology Follow Up Visit  Kathleen Pace [id number] [birth year] 76 y.o. 09/11/2017   Principle Diagnosis:  Chronic immune thrombocytopenia, remission Pernicious anemia Vitiligo  Current Therapy:   Vitamin B12 1 mg IM monthly    Interim History:  Kathleen Pace is here today for follow-up. She is doing fairly well and s now seeing Dr. Gavin Pound with [facility]. She states that the feel she has RA. She follows up with them again next week.  She has continues to do monthly B 12 1mg  injections once a month as prescribed. B12 level for today is pending.  She has occasional SOB with over exertion. This is unchanged.  No fever, chills, n/v, cough, rash, dizziness, chest pain, palpitations, abdominal pain or changes in bowel or bladder habits.  No swelling in her extremities at this time. She has joint pain in her hands and intermittent numbness and tingling in her hands.  She has maintained a good appetite and is staying well hydrated. Her weight is stable.   ECOG Performance Status: 1 - Symptomatic but completely ambulatory  Medications:  Allergies as of 09/11/2017      Reactions   Iodinated Diagnostic Agents    Penicillins    Quinine Derivatives    Sulfa Antibiotics       Medication List        Accurate as of 09/11/17  2:11 PM. Always use your most recent med list.          acetaminophen 325 MG tablet Commonly known as:  TYLENOL Take 650 mg by mouth every 6 (six) hours as needed.   albuterol 108 (90 Base) MCG/ACT inhaler Commonly known as:  PROVENTIL HFA;VENTOLIN HFA Inhale into the lungs.   alendronate 70 MG tablet Commonly known as:  FOSAMAX Take 1 tablet po weekly.  Stay upright for 1 hour following   CALCIUM-VITAMIN D PO Take 1 capsule by mouth every morning. Calcium 600 mg  Vitamin D 200   cyanocobalamin 1000 MCG/ML injection Commonly known as:  (VITAMIN B-12) INJECT 1 ML ONCE MONTHLY   esomeprazole 20 MG capsule Commonly  known as:  NEXIUM Take 20 mg by mouth daily at 12 noon.   Krill Oil 500 MG Caps Take by mouth every morning.   levothyroxine 125 MCG tablet Commonly known as:  SYNTHROID, LEVOTHROID TAKE 1 AND 1/2 TABLETS 1 DAY WEEKLY, 1 TABLET 6 DAYS WEEKLY   lisinopril 40 MG tablet Commonly known as:  PRINIVIL,ZESTRIL Take 40 mg by mouth daily.   sertraline 50 MG tablet Commonly known as:  ZOLOFT Take 50 mg by mouth daily.   umeclidinium-vilanterol 62.5-25 MCG/INH Aepb Commonly known as:  ANORO ELLIPTA Inhale into the lungs.   Vitamin D 2000 units tablet Take 2,000 Units by mouth daily.       Allergies:  Allergies  Allergen Reactions  . Iodinated Diagnostic Agents   . Penicillins   . Quinine Derivatives   . Sulfa Antibiotics     Past Medical History, Surgical history, Social history, and Family History were reviewed and updated.  Review of Systems: All other 10 point review of systems is negative.   Physical Exam:  weight is 140 lb 8 oz (63.7 kg). Her oral temperature is 97.5 F (36.4 C) (abnormal). Her blood pressure is 184/65 (abnormal) and her pulse is 60. Her respiration is 20 and oxygen saturation is 97%.   Wt Readings from Last 3 Encounters:  09/11/17 140 lb 8 oz (63.7 kg)  06/11/17 136 lb (61.7  kg)  07/31/16 131 lb 12.8 oz (59.8 kg)    Ocular: Sclerae unicteric, pupils equal, round and reactive to light Ear-nose-throat: Oropharynx clear, dentition fair Lymphatic: No cervical, supraclavicular or axillary adenopathy Lungs no rales or rhonchi, good excursion bilaterally Heart regular rate and rhythm, no murmur appreciated Abd soft, nontender, positive bowel sounds, no liver or spleen tip palpated on exam, no fluid wave MSK no focal spinal tenderness, no joint edema Neuro: non-focal, well-oriented, appropriate affect Breasts: Deferred   Lab Results  Component Value Date   WBC 8.7 09/11/2017   HGB 13.0 09/11/2017   HCT 38.6 09/11/2017   MCV 102 (H) 09/11/2017    PLT 390 Platelet count consistent in citrate 09/11/2017   Lab Results  Component Value Date   FERRITIN 34 06/11/2017   IRON 117 06/11/2017   TIBC 273 06/11/2017   UIBC 156 06/11/2017   IRONPCTSAT 43 06/11/2017   Lab Results  Component Value Date   RETICCTPCT 1.6 09/19/2011   RBC 3.78 09/11/2017   RETICCTABS 66.6 09/19/2011   No results found for: KPAFRELGTCHN, LAMBDASER, KAPLAMBRATIO No results found for: Osborne Casco Lab Results  Component Value Date   TOTALPROTELP 7.0 09/19/2011   ALBUMINELP 57.8 09/19/2011   A1GS 4.6 09/19/2011   A2GS 10.3 09/19/2011   BETS 6.8 09/19/2011   BETA2SER 6.1 09/19/2011   GAMS 14.4 09/19/2011   MSPIKE NOT DET 09/19/2011   SPEI * 09/19/2011     Chemistry      Component Value Date/Time   NA 144 09/11/2017 1335   NA 138 07/31/2016 0947   K 4.2 09/11/2017 1335   K 4.3 07/31/2016 0947   CL 104 09/11/2017 1335   CO2 28 09/11/2017 1335   CO2 27 07/31/2016 0947   BUN 17 09/11/2017 1335   BUN 14.7 07/31/2016 0947   CREATININE 0.9 09/11/2017 1335   CREATININE 0.7 07/31/2016 0947      Component Value Date/Time   CALCIUM 9.0 09/11/2017 1335   CALCIUM 9.2 07/31/2016 0947   ALKPHOS 102 (H) 09/11/2017 1335   ALKPHOS 70 07/31/2016 0947   AST 28 09/11/2017 1335   AST 18 07/31/2016 0947   ALT 20 09/11/2017 1335   ALT 9 07/31/2016 0947   BILITOT 0.60 09/11/2017 1335   BILITOT 0.62 07/31/2016 0947      Impression and Plan: Kathleen Pace is a very pleasant 76 yo caucasian female with pernicious anemia as well as what is felt to be rheumatoid arthritis.  She continues to do B 12 injections once a month at home and her B 12 level has remained stable.  She follows up with rheumatology next week.  We will plan to see her back again in another 4 months for follow-up and lab.  She will contact our office with any questions or concerns. We can certainly see him sooner if need be.   Eliezer Bottom, NP 11/23/20182:11 PM

## 2017-09-12 LAB — LUPUS ANTICOAGULANT PANEL
DRVVT: 29.8 s (ref 0.0–47.0)
PTT-LA: 33.1 s (ref 0.0–51.9)

## 2017-09-12 LAB — VITAMIN B12: Vitamin B12: 464 pg/mL (ref 232–1245)

## 2017-09-12 LAB — CARDIOLIPIN ANTIBODIES, IGG, IGM, IGA
ANTICARDIOLIPIN IGA: 11 U/mL (ref 0–11)
Anticardiolipin Ab,IgG,Qn: <9 GPL U/mL (ref 0–14)
Anticardiolipin Ab,IgM,Qn: 21 MPL U/mL — ABNORMAL HIGH (ref 0–12)

## 2017-09-14 LAB — ANTINUCLEAR ANTIBODIES, IFA: ANTINUCLEAR ANTIBODIES, IFA: NEGATIVE

## 2017-09-15 DIAGNOSIS — R768 Other specified abnormal immunological findings in serum: Secondary | ICD-10-CM | POA: Diagnosis not present

## 2017-09-15 DIAGNOSIS — M79642 Pain in left hand: Secondary | ICD-10-CM | POA: Diagnosis not present

## 2017-09-15 DIAGNOSIS — M79641 Pain in right hand: Secondary | ICD-10-CM | POA: Diagnosis not present

## 2017-09-15 DIAGNOSIS — Z6825 Body mass index (BMI) 25.0-25.9, adult: Secondary | ICD-10-CM | POA: Diagnosis not present

## 2017-09-15 DIAGNOSIS — M0589 Other rheumatoid arthritis with rheumatoid factor of multiple sites: Secondary | ICD-10-CM | POA: Diagnosis not present

## 2017-09-15 DIAGNOSIS — E663 Overweight: Secondary | ICD-10-CM | POA: Diagnosis not present

## 2017-09-15 DIAGNOSIS — H04123 Dry eye syndrome of bilateral lacrimal glands: Secondary | ICD-10-CM | POA: Diagnosis not present

## 2017-09-17 DIAGNOSIS — M069 Rheumatoid arthritis, unspecified: Secondary | ICD-10-CM | POA: Diagnosis not present

## 2017-09-17 DIAGNOSIS — M3501 Sicca syndrome with keratoconjunctivitis: Secondary | ICD-10-CM | POA: Diagnosis not present

## 2017-09-17 DIAGNOSIS — E039 Hypothyroidism, unspecified: Secondary | ICD-10-CM | POA: Diagnosis not present

## 2017-09-17 DIAGNOSIS — I10 Essential (primary) hypertension: Secondary | ICD-10-CM | POA: Diagnosis not present

## 2017-10-01 DIAGNOSIS — H04123 Dry eye syndrome of bilateral lacrimal glands: Secondary | ICD-10-CM | POA: Diagnosis not present

## 2017-10-01 DIAGNOSIS — H16143 Punctate keratitis, bilateral: Secondary | ICD-10-CM | POA: Diagnosis not present

## 2017-10-01 DIAGNOSIS — M3501 Sicca syndrome with keratoconjunctivitis: Secondary | ICD-10-CM | POA: Diagnosis not present

## 2017-10-26 DIAGNOSIS — H1131 Conjunctival hemorrhage, right eye: Secondary | ICD-10-CM | POA: Diagnosis not present

## 2017-10-26 DIAGNOSIS — H04123 Dry eye syndrome of bilateral lacrimal glands: Secondary | ICD-10-CM | POA: Diagnosis not present

## 2017-10-26 DIAGNOSIS — H43393 Other vitreous opacities, bilateral: Secondary | ICD-10-CM | POA: Diagnosis not present

## 2017-11-04 DIAGNOSIS — R768 Other specified abnormal immunological findings in serum: Secondary | ICD-10-CM | POA: Diagnosis not present

## 2017-11-04 DIAGNOSIS — E663 Overweight: Secondary | ICD-10-CM | POA: Diagnosis not present

## 2017-11-04 DIAGNOSIS — Z6825 Body mass index (BMI) 25.0-25.9, adult: Secondary | ICD-10-CM | POA: Diagnosis not present

## 2017-11-04 DIAGNOSIS — M79642 Pain in left hand: Secondary | ICD-10-CM | POA: Diagnosis not present

## 2017-11-04 DIAGNOSIS — M79641 Pain in right hand: Secondary | ICD-10-CM | POA: Diagnosis not present

## 2017-11-04 DIAGNOSIS — M0589 Other rheumatoid arthritis with rheumatoid factor of multiple sites: Secondary | ICD-10-CM | POA: Diagnosis not present

## 2017-11-04 DIAGNOSIS — H04123 Dry eye syndrome of bilateral lacrimal glands: Secondary | ICD-10-CM | POA: Diagnosis not present

## 2017-11-27 DIAGNOSIS — M0589 Other rheumatoid arthritis with rheumatoid factor of multiple sites: Secondary | ICD-10-CM | POA: Diagnosis not present

## 2017-11-27 DIAGNOSIS — R768 Other specified abnormal immunological findings in serum: Secondary | ICD-10-CM | POA: Diagnosis not present

## 2017-11-27 DIAGNOSIS — M79642 Pain in left hand: Secondary | ICD-10-CM | POA: Diagnosis not present

## 2017-11-27 DIAGNOSIS — M79641 Pain in right hand: Secondary | ICD-10-CM | POA: Diagnosis not present

## 2017-11-27 DIAGNOSIS — H04123 Dry eye syndrome of bilateral lacrimal glands: Secondary | ICD-10-CM | POA: Diagnosis not present

## 2017-11-27 DIAGNOSIS — Z6825 Body mass index (BMI) 25.0-25.9, adult: Secondary | ICD-10-CM | POA: Diagnosis not present

## 2017-11-27 DIAGNOSIS — E663 Overweight: Secondary | ICD-10-CM | POA: Diagnosis not present

## 2017-12-11 ENCOUNTER — Inpatient Hospital Stay: Payer: PPO

## 2017-12-11 ENCOUNTER — Inpatient Hospital Stay: Payer: PPO | Attending: Hematology & Oncology | Admitting: Hematology & Oncology

## 2018-01-14 DIAGNOSIS — E039 Hypothyroidism, unspecified: Secondary | ICD-10-CM | POA: Diagnosis not present

## 2018-01-14 DIAGNOSIS — F411 Generalized anxiety disorder: Secondary | ICD-10-CM | POA: Diagnosis not present

## 2018-01-14 DIAGNOSIS — K219 Gastro-esophageal reflux disease without esophagitis: Secondary | ICD-10-CM | POA: Diagnosis not present

## 2018-01-14 DIAGNOSIS — I10 Essential (primary) hypertension: Secondary | ICD-10-CM | POA: Diagnosis not present

## 2018-01-15 DIAGNOSIS — E039 Hypothyroidism, unspecified: Secondary | ICD-10-CM | POA: Diagnosis not present

## 2018-01-15 DIAGNOSIS — I10 Essential (primary) hypertension: Secondary | ICD-10-CM | POA: Diagnosis not present

## 2018-01-29 DIAGNOSIS — H16143 Punctate keratitis, bilateral: Secondary | ICD-10-CM | POA: Diagnosis not present

## 2018-01-29 DIAGNOSIS — M3501 Sicca syndrome with keratoconjunctivitis: Secondary | ICD-10-CM | POA: Diagnosis not present

## 2018-01-29 DIAGNOSIS — H04123 Dry eye syndrome of bilateral lacrimal glands: Secondary | ICD-10-CM | POA: Diagnosis not present

## 2018-02-11 DIAGNOSIS — H02423 Myogenic ptosis of bilateral eyelids: Secondary | ICD-10-CM | POA: Diagnosis not present

## 2018-02-18 DIAGNOSIS — R3129 Other microscopic hematuria: Secondary | ICD-10-CM | POA: Diagnosis not present

## 2018-02-18 DIAGNOSIS — R109 Unspecified abdominal pain: Secondary | ICD-10-CM | POA: Diagnosis not present

## 2018-02-22 DIAGNOSIS — H02423 Myogenic ptosis of bilateral eyelids: Secondary | ICD-10-CM | POA: Diagnosis not present

## 2018-02-22 DIAGNOSIS — Z01818 Encounter for other preprocedural examination: Secondary | ICD-10-CM | POA: Diagnosis not present

## 2018-03-02 DIAGNOSIS — N281 Cyst of kidney, acquired: Secondary | ICD-10-CM | POA: Diagnosis not present

## 2018-03-02 DIAGNOSIS — R3129 Other microscopic hematuria: Secondary | ICD-10-CM | POA: Diagnosis not present

## 2018-03-17 DIAGNOSIS — J449 Chronic obstructive pulmonary disease, unspecified: Secondary | ICD-10-CM | POA: Diagnosis not present

## 2018-03-19 DIAGNOSIS — R319 Hematuria, unspecified: Secondary | ICD-10-CM | POA: Diagnosis not present

## 2018-03-19 DIAGNOSIS — R3129 Other microscopic hematuria: Secondary | ICD-10-CM | POA: Diagnosis not present

## 2018-03-19 DIAGNOSIS — R109 Unspecified abdominal pain: Secondary | ICD-10-CM | POA: Diagnosis not present

## 2018-04-05 DIAGNOSIS — H02423 Myogenic ptosis of bilateral eyelids: Secondary | ICD-10-CM | POA: Diagnosis not present

## 2018-04-15 DIAGNOSIS — M79642 Pain in left hand: Secondary | ICD-10-CM | POA: Diagnosis not present

## 2018-04-15 DIAGNOSIS — M79641 Pain in right hand: Secondary | ICD-10-CM | POA: Diagnosis not present

## 2018-04-15 DIAGNOSIS — M65332 Trigger finger, left middle finger: Secondary | ICD-10-CM | POA: Diagnosis not present

## 2018-04-15 DIAGNOSIS — H04123 Dry eye syndrome of bilateral lacrimal glands: Secondary | ICD-10-CM | POA: Diagnosis not present

## 2018-04-15 DIAGNOSIS — R768 Other specified abnormal immunological findings in serum: Secondary | ICD-10-CM | POA: Diagnosis not present

## 2018-04-15 DIAGNOSIS — E663 Overweight: Secondary | ICD-10-CM | POA: Diagnosis not present

## 2018-04-15 DIAGNOSIS — R110 Nausea: Secondary | ICD-10-CM | POA: Diagnosis not present

## 2018-04-15 DIAGNOSIS — Z6825 Body mass index (BMI) 25.0-25.9, adult: Secondary | ICD-10-CM | POA: Diagnosis not present

## 2018-04-15 DIAGNOSIS — M0589 Other rheumatoid arthritis with rheumatoid factor of multiple sites: Secondary | ICD-10-CM | POA: Diagnosis not present

## 2018-07-21 DIAGNOSIS — K219 Gastro-esophageal reflux disease without esophagitis: Secondary | ICD-10-CM | POA: Diagnosis not present

## 2018-07-21 DIAGNOSIS — Z23 Encounter for immunization: Secondary | ICD-10-CM | POA: Diagnosis not present

## 2018-07-21 DIAGNOSIS — Z0000 Encounter for general adult medical examination without abnormal findings: Secondary | ICD-10-CM | POA: Diagnosis not present

## 2018-07-21 DIAGNOSIS — F411 Generalized anxiety disorder: Secondary | ICD-10-CM | POA: Diagnosis not present

## 2018-07-21 DIAGNOSIS — E039 Hypothyroidism, unspecified: Secondary | ICD-10-CM | POA: Diagnosis not present

## 2018-07-21 DIAGNOSIS — M7582 Other shoulder lesions, left shoulder: Secondary | ICD-10-CM | POA: Diagnosis not present

## 2018-07-21 DIAGNOSIS — Z1239 Encounter for other screening for malignant neoplasm of breast: Secondary | ICD-10-CM | POA: Diagnosis not present

## 2018-07-21 DIAGNOSIS — E559 Vitamin D deficiency, unspecified: Secondary | ICD-10-CM | POA: Diagnosis not present

## 2018-07-21 DIAGNOSIS — I10 Essential (primary) hypertension: Secondary | ICD-10-CM | POA: Diagnosis not present

## 2018-08-11 DIAGNOSIS — Z1231 Encounter for screening mammogram for malignant neoplasm of breast: Secondary | ICD-10-CM | POA: Diagnosis not present

## 2018-08-11 DIAGNOSIS — I10 Essential (primary) hypertension: Secondary | ICD-10-CM | POA: Diagnosis not present

## 2018-08-11 DIAGNOSIS — E559 Vitamin D deficiency, unspecified: Secondary | ICD-10-CM | POA: Diagnosis not present

## 2018-08-11 DIAGNOSIS — K219 Gastro-esophageal reflux disease without esophagitis: Secondary | ICD-10-CM | POA: Diagnosis not present

## 2018-08-11 DIAGNOSIS — E039 Hypothyroidism, unspecified: Secondary | ICD-10-CM | POA: Diagnosis not present

## 2018-08-23 DIAGNOSIS — N6489 Other specified disorders of breast: Secondary | ICD-10-CM | POA: Diagnosis not present

## 2018-08-23 DIAGNOSIS — R928 Other abnormal and inconclusive findings on diagnostic imaging of breast: Secondary | ICD-10-CM | POA: Diagnosis not present

## 2018-08-24 DIAGNOSIS — J449 Chronic obstructive pulmonary disease, unspecified: Secondary | ICD-10-CM | POA: Diagnosis not present

## 2018-08-26 DIAGNOSIS — Z79899 Other long term (current) drug therapy: Secondary | ICD-10-CM | POA: Diagnosis not present

## 2018-08-26 DIAGNOSIS — M0589 Other rheumatoid arthritis with rheumatoid factor of multiple sites: Secondary | ICD-10-CM | POA: Diagnosis not present

## 2018-08-26 DIAGNOSIS — M3500 Sicca syndrome, unspecified: Secondary | ICD-10-CM | POA: Diagnosis not present

## 2018-08-26 DIAGNOSIS — M2550 Pain in unspecified joint: Secondary | ICD-10-CM | POA: Diagnosis not present

## 2018-08-26 DIAGNOSIS — Z6824 Body mass index (BMI) 24.0-24.9, adult: Secondary | ICD-10-CM | POA: Diagnosis not present

## 2018-08-31 DIAGNOSIS — J449 Chronic obstructive pulmonary disease, unspecified: Secondary | ICD-10-CM | POA: Diagnosis not present

## 2018-09-28 DIAGNOSIS — M0589 Other rheumatoid arthritis with rheumatoid factor of multiple sites: Secondary | ICD-10-CM | POA: Diagnosis not present

## 2018-10-01 DIAGNOSIS — J060 Acute laryngopharyngitis: Secondary | ICD-10-CM | POA: Diagnosis not present

## 2018-10-01 DIAGNOSIS — J208 Acute bronchitis due to other specified organisms: Secondary | ICD-10-CM | POA: Diagnosis not present

## 2018-10-01 DIAGNOSIS — B9689 Other specified bacterial agents as the cause of diseases classified elsewhere: Secondary | ICD-10-CM | POA: Diagnosis not present

## 2018-10-01 DIAGNOSIS — Z9081 Acquired absence of spleen: Secondary | ICD-10-CM | POA: Diagnosis not present

## 2018-10-07 ENCOUNTER — Other Ambulatory Visit: Payer: Self-pay | Admitting: *Deleted

## 2018-10-07 DIAGNOSIS — D501 Sideropenic dysphagia: Secondary | ICD-10-CM

## 2018-10-07 DIAGNOSIS — E538 Deficiency of other specified B group vitamins: Secondary | ICD-10-CM

## 2018-10-08 ENCOUNTER — Inpatient Hospital Stay: Payer: PPO | Attending: Hematology & Oncology

## 2018-10-08 ENCOUNTER — Encounter: Payer: Self-pay | Admitting: Hematology & Oncology

## 2018-10-08 ENCOUNTER — Other Ambulatory Visit: Payer: Self-pay

## 2018-10-08 ENCOUNTER — Inpatient Hospital Stay (HOSPITAL_BASED_OUTPATIENT_CLINIC_OR_DEPARTMENT_OTHER): Payer: PPO | Admitting: Hematology & Oncology

## 2018-10-08 VITALS — BP 185/70 | HR 67 | Temp 98.4°F | Resp 19 | Wt 129.8 lb

## 2018-10-08 DIAGNOSIS — R531 Weakness: Secondary | ICD-10-CM

## 2018-10-08 DIAGNOSIS — E039 Hypothyroidism, unspecified: Secondary | ICD-10-CM

## 2018-10-08 DIAGNOSIS — M069 Rheumatoid arthritis, unspecified: Secondary | ICD-10-CM | POA: Diagnosis not present

## 2018-10-08 DIAGNOSIS — Z79899 Other long term (current) drug therapy: Secondary | ICD-10-CM

## 2018-10-08 DIAGNOSIS — D693 Immune thrombocytopenic purpura: Secondary | ICD-10-CM | POA: Insufficient documentation

## 2018-10-08 DIAGNOSIS — R5383 Other fatigue: Secondary | ICD-10-CM

## 2018-10-08 DIAGNOSIS — D510 Vitamin B12 deficiency anemia due to intrinsic factor deficiency: Secondary | ICD-10-CM | POA: Insufficient documentation

## 2018-10-08 DIAGNOSIS — D501 Sideropenic dysphagia: Secondary | ICD-10-CM

## 2018-10-08 DIAGNOSIS — L80 Vitiligo: Secondary | ICD-10-CM | POA: Insufficient documentation

## 2018-10-08 DIAGNOSIS — E538 Deficiency of other specified B group vitamins: Secondary | ICD-10-CM

## 2018-10-08 LAB — CMP (CANCER CENTER ONLY)
ALBUMIN: 3.4 g/dL — AB (ref 3.5–5.0)
ALT: 10 U/L (ref 0–44)
ANION GAP: 5 (ref 5–15)
AST: 11 U/L — ABNORMAL LOW (ref 15–41)
Alkaline Phosphatase: 62 U/L (ref 38–126)
BUN: 15 mg/dL (ref 8–23)
CO2: 35 mmol/L — ABNORMAL HIGH (ref 22–32)
Calcium: 9 mg/dL (ref 8.9–10.3)
Chloride: 100 mmol/L (ref 98–111)
Creatinine: 0.69 mg/dL (ref 0.44–1.00)
GFR, Est AFR Am: >60 mL/min (ref >60)
GFR, Estimated: >60 mL/min (ref >60)
GLUCOSE: 94 mg/dL (ref 70–99)
Potassium: 5.1 mmol/L (ref 3.5–5.1)
SODIUM: 140 mmol/L (ref 135–145)
Total Bilirubin: 0.4 mg/dL (ref 0.3–1.2)
Total Protein: 5.8 g/dL — ABNORMAL LOW (ref 6.5–8.1)

## 2018-10-08 LAB — CBC WITH DIFFERENTIAL (CANCER CENTER ONLY)
Abs Immature Granulocytes: 0.07 10*3/uL (ref 0.00–0.07)
BASOS PCT: 0 %
Basophils Absolute: 0 10*3/uL (ref 0.0–0.1)
Eosinophils Absolute: 0.1 10*3/uL (ref 0.0–0.5)
Eosinophils Relative: 1 %
HCT: 40.5 % (ref 36.0–46.0)
Hemoglobin: 12.8 g/dL (ref 12.0–15.0)
Immature Granulocytes: 1 %
Lymphocytes Relative: 35 %
Lymphs Abs: 3.3 10*3/uL (ref 0.7–4.0)
MCH: 31.8 pg (ref 26.0–34.0)
MCHC: 31.6 g/dL (ref 30.0–36.0)
MCV: 100.5 fL — ABNORMAL HIGH (ref 80.0–100.0)
Monocytes Absolute: 1.7 10*3/uL — ABNORMAL HIGH (ref 0.1–1.0)
Monocytes Relative: 18 %
Neutro Abs: 4.4 10*3/uL (ref 1.7–7.7)
Neutrophils Relative %: 45 %
Platelet Count: 466 10*3/uL — ABNORMAL HIGH (ref 150–400)
RBC: 4.03 MIL/uL (ref 3.87–5.11)
RDW: 13.5 % (ref 11.5–15.5)
WBC Count: 9.5 10*3/uL (ref 4.0–10.5)
nRBC: 0 % (ref 0.0–0.2)

## 2018-10-08 LAB — VITAMIN B12: Vitamin B-12: 543 pg/mL (ref 180–914)

## 2018-10-08 NOTE — Progress Notes (Signed)
Hematology and Oncology Follow Up Visit  Kathleen Pace [id number] 1941-08-06 77 y.o. 10/08/2018   Principle Diagnosis:  Chronic immune thrombocytopenia, remission. 2. Pernicious anemia. 3. Vitiligo. 4. Rheumatoid Arthritis  Current Therapy:    Vitamin B12 1 mg IM Q. month      Interim History:  Ms.  Happel is back for follow-up.  She now has rheumatoid arthritis.  This is no surprise.  She is a autoimmune "nightmare."  She is on leflunomide for this.  She says this is helping a little bit.  She has a little bit of a cold.  She is on a little bit of prednisone for this.  She is also on Omnicef.  She has changed blood pressure medications.  She now is taking valsartan.  She has had no problems with bleeding.  There is been no change in bowel or bladder habits.  She has had no mouth sores.  There is been no leg swelling.  Her joints have gotten better since starting the steroid for her rheumatism.  She does her vitamin B12 injections at home.  Overall, her performance status is ECOG 1.  Medications:  Current Outpatient Medications:  .  hydrocortisone 2.5 % cream, Apply to affected area three times a day as needed., Disp: , Rfl:  .  leflunomide (ARAVA) 20 MG tablet, Take 10 mg by mouth daily., Disp: , Rfl:  .  meloxicam (MOBIC) 7.5 MG tablet, Take 7.7 mg by mouth., Disp: , Rfl:  .  predniSONE (STERAPRED UNI-PAK 21 TAB) 10 MG (21) TBPK tablet, Take by mouth., Disp: , Rfl:  .  valsartan (DIOVAN) 320 MG tablet, Take by mouth., Disp: , Rfl:  .  acetaminophen (TYLENOL) 325 MG tablet, Take 650 mg by mouth every 6 (six) hours as needed.  , Disp: , Rfl:  .  albuterol (PROVENTIL HFA;VENTOLIN HFA) 108 (90 Base) MCG/ACT inhaler, Inhale into the lungs., Disp: , Rfl:  .  alendronate (FOSAMAX) 70 MG tablet, Take 1 tablet po weekly.  Stay upright for 1 hour following, Disp: , Rfl:  .  CALCIUM-VITAMIN D PO, Take 1 capsule by mouth every morning. Calcium 600 mg  Vitamin D 200, Disp: ,  Rfl:  .  cefdinir (OMNICEF) 300 MG capsule, Take 300 mg by mouth 2 (two) times daily., Disp: , Rfl:  .  Cholecalciferol (VITAMIN D) 2000 UNITS tablet, Take 2,000 Units by mouth daily., Disp: , Rfl:  .  cyanocobalamin (,VITAMIN B-12,) 1000 MCG/ML injection, INJECT 1 ML ONCE MONTHLY, Disp: 25 mL, Rfl: 2 .  cycloSPORINE (RESTASIS) 0.05 % ophthalmic emulsion, 1 drop 2 times daily., Disp: , Rfl:  .  esomeprazole (NEXIUM) 20 MG capsule, Take 20 mg by mouth daily at 12 noon., Disp: , Rfl:  .  hydroxychloroquine (PLAQUENIL) 200 MG tablet, Take 2 tablets by mouth with food or milk once a day, Disp: , Rfl:  .  Krill Oil 500 MG CAPS, Take by mouth every morning., Disp: , Rfl:  .  levothyroxine (SYNTHROID, LEVOTHROID) 125 MCG tablet, TAKE 1 AND 1/2 TABLETS 1 DAY WEEKLY, 1 TABLET 6 DAYS WEEKLY, Disp: , Rfl:  .  lisinopril (PRINIVIL,ZESTRIL) 40 MG tablet, Take 40 mg by mouth daily., Disp: , Rfl:  .  Misc Natural Products (TUMERSAID) TABS, Take by mouth., Disp: , Rfl:  .  Omega-3 Fatty Acids (OMEGA-3 FISH OIL PO), Take by mouth., Disp: , Rfl:  .  omeprazole (PRILOSEC) 20 MG capsule, Take 20 mg by mouth as needed., Disp: , Rfl:  .  sertraline (ZOLOFT) 50 MG tablet, Take 50 mg by mouth daily.  , Disp: , Rfl:  .  umeclidinium-vilanterol (ANORO ELLIPTA) 62.5-25 MCG/INH AEPB, Inhale into the lungs., Disp: , Rfl:   Allergies:  Allergies  Allergen Reactions  . Iodinated Diagnostic Agents Swelling  . Sulfa Antibiotics Other (See Comments)  . Penicillins   . Quinine Derivatives   . Doxycycline Nausea Only    Past Medical History, Surgical history, Social history, and Family History were reviewed and updated.  Review of Systems: Review of Systems  Constitutional: Positive for malaise/fatigue.  HENT: Positive for congestion.   Eyes: Negative.   Respiratory: Positive for cough.   Cardiovascular: Positive for palpitations.  Gastrointestinal: Positive for diarrhea and nausea.  Genitourinary: Negative.    Musculoskeletal: Positive for joint pain and myalgias.  Skin: Negative.   Neurological: Negative.   Endo/Heme/Allergies: Negative.   Psychiatric/Behavioral: Negative.      Physical Exam:  weight is 129 lb 12 oz (58.9 kg). Her oral temperature is 98.4 F (36.9 C). Her blood pressure is 185/70 (abnormal) and her pulse is 67. Her respiration is 19 and oxygen saturation is 96%.   Physical Exam Vitals signs reviewed.  HENT:     Head: Normocephalic and atraumatic.  Eyes:     Pupils: Pupils are equal, round, and reactive to light.  Neck:     Musculoskeletal: Normal range of motion.  Cardiovascular:     Rate and Rhythm: Normal rate and regular rhythm.     Heart sounds: Normal heart sounds.  Pulmonary:     Effort: Pulmonary effort is normal.     Breath sounds: Normal breath sounds.  Abdominal:     General: Bowel sounds are normal.     Palpations: Abdomen is soft.  Musculoskeletal: Normal range of motion.        General: No tenderness or deformity.  Lymphadenopathy:     Cervical: No cervical adenopathy.  Skin:    General: Skin is warm and dry.     Findings: No erythema or rash.  Neurological:     Mental Status: She is alert and oriented to person, place, and time.  Psychiatric:        Behavior: Behavior normal.        Thought Content: Thought content normal.        Judgment: Judgment normal.      Lab Results  Component Value Date   WBC 9.5 10/08/2018   HGB 12.8 10/08/2018   HCT 40.5 10/08/2018   MCV 100.5 (H) 10/08/2018   PLT 466 (H) 10/08/2018     Chemistry      Component Value Date/Time   NA 140 10/08/2018 1049   NA 144 09/11/2017 1335   NA 138 07/31/2016 0947   K 5.1 10/08/2018 1049   K 4.2 09/11/2017 1335   K 4.3 07/31/2016 0947   CL 100 10/08/2018 1049   CL 104 09/11/2017 1335   CO2 35 (H) 10/08/2018 1049   CO2 28 09/11/2017 1335   CO2 27 07/31/2016 0947   BUN 15 10/08/2018 1049   BUN 17 09/11/2017 1335   BUN 14.7 07/31/2016 0947   CREATININE 0.69  10/08/2018 1049   CREATININE 0.9 09/11/2017 1335   CREATININE 0.7 07/31/2016 0947      Component Value Date/Time   CALCIUM 9.0 10/08/2018 1049   CALCIUM 9.0 09/11/2017 1335   CALCIUM 9.2 07/31/2016 0947   ALKPHOS 62 10/08/2018 1049   ALKPHOS 102 (H) 09/11/2017 1335   ALKPHOS 70 07/31/2016  0947   AST 11 (L) 10/08/2018 1049   AST 18 07/31/2016 0947   ALT 10 10/08/2018 1049   ALT 20 09/11/2017 1335   ALT 9 07/31/2016 0947   BILITOT 0.4 10/08/2018 1049   BILITOT 0.62 07/31/2016 0947         Impression and Plan: Ms. Schiffer is 77 year old white female. She has a clear autoimmune issue. She has pernicious anemia. She has vitiligo. She has hypo-thyroidism.  I suspect that her platelet count is on the higher side because of the rheumatoid arthritis in her medications.  I am not too worried about this.  I do worry that she will develop diabetes at some point.  She is developing all these autoimmune diseases.  She is not yet developed diabetes.  Hopefully this will not happen.  I would like to see her back in 6 months.  I just want to make sure that we follow her closely.  She says that her son is in jail.  He is in a prison for second-degree murder.  He was involved with a vehicular homicide.  I think he was under the influence at the time.  Hopefully, he will turn his life around and come out in another for 5 years a better person.   Volanda Napoleon, MD 12/20/201911:41 AM

## 2018-10-18 DIAGNOSIS — J188 Other pneumonia, unspecified organism: Secondary | ICD-10-CM | POA: Diagnosis not present

## 2018-10-18 DIAGNOSIS — B009 Herpesviral infection, unspecified: Secondary | ICD-10-CM | POA: Diagnosis not present

## 2018-10-21 DIAGNOSIS — J449 Chronic obstructive pulmonary disease, unspecified: Secondary | ICD-10-CM | POA: Diagnosis not present

## 2018-10-21 DIAGNOSIS — J441 Chronic obstructive pulmonary disease with (acute) exacerbation: Secondary | ICD-10-CM | POA: Diagnosis not present

## 2018-10-21 DIAGNOSIS — B028 Zoster with other complications: Secondary | ICD-10-CM | POA: Diagnosis not present

## 2018-10-27 ENCOUNTER — Other Ambulatory Visit: Payer: Self-pay | Admitting: Hematology & Oncology

## 2018-10-27 DIAGNOSIS — D510 Vitamin B12 deficiency anemia due to intrinsic factor deficiency: Secondary | ICD-10-CM

## 2018-11-10 DIAGNOSIS — E039 Hypothyroidism, unspecified: Secondary | ICD-10-CM | POA: Diagnosis not present

## 2018-11-10 DIAGNOSIS — H00014 Hordeolum externum left upper eyelid: Secondary | ICD-10-CM | POA: Diagnosis not present

## 2018-11-10 DIAGNOSIS — J441 Chronic obstructive pulmonary disease with (acute) exacerbation: Secondary | ICD-10-CM | POA: Diagnosis not present

## 2018-11-10 DIAGNOSIS — B0229 Other postherpetic nervous system involvement: Secondary | ICD-10-CM | POA: Diagnosis not present

## 2019-01-20 DIAGNOSIS — F411 Generalized anxiety disorder: Secondary | ICD-10-CM | POA: Diagnosis not present

## 2019-01-20 DIAGNOSIS — I10 Essential (primary) hypertension: Secondary | ICD-10-CM | POA: Diagnosis not present

## 2019-01-20 DIAGNOSIS — J441 Chronic obstructive pulmonary disease with (acute) exacerbation: Secondary | ICD-10-CM | POA: Diagnosis not present

## 2019-01-20 DIAGNOSIS — B0229 Other postherpetic nervous system involvement: Secondary | ICD-10-CM | POA: Diagnosis not present

## 2019-04-07 ENCOUNTER — Encounter: Payer: Self-pay | Admitting: Hematology & Oncology

## 2019-04-07 ENCOUNTER — Other Ambulatory Visit (HOSPITAL_BASED_OUTPATIENT_CLINIC_OR_DEPARTMENT_OTHER): Payer: PPO

## 2019-04-07 ENCOUNTER — Inpatient Hospital Stay: Payer: PPO | Attending: Hematology & Oncology | Admitting: Hematology & Oncology

## 2019-04-07 ENCOUNTER — Other Ambulatory Visit: Payer: Self-pay

## 2019-04-07 ENCOUNTER — Inpatient Hospital Stay: Payer: PPO

## 2019-04-07 VITALS — BP 165/74 | HR 90 | Temp 98.4°F | Resp 18 | Wt 110.0 lb

## 2019-04-07 DIAGNOSIS — D510 Vitamin B12 deficiency anemia due to intrinsic factor deficiency: Secondary | ICD-10-CM

## 2019-04-07 DIAGNOSIS — R5383 Other fatigue: Secondary | ICD-10-CM

## 2019-04-07 DIAGNOSIS — E039 Hypothyroidism, unspecified: Secondary | ICD-10-CM | POA: Diagnosis not present

## 2019-04-07 DIAGNOSIS — R599 Enlarged lymph nodes, unspecified: Secondary | ICD-10-CM

## 2019-04-07 DIAGNOSIS — M069 Rheumatoid arthritis, unspecified: Secondary | ICD-10-CM | POA: Diagnosis not present

## 2019-04-07 DIAGNOSIS — R634 Abnormal weight loss: Secondary | ICD-10-CM | POA: Diagnosis not present

## 2019-04-07 DIAGNOSIS — D501 Sideropenic dysphagia: Secondary | ICD-10-CM

## 2019-04-07 DIAGNOSIS — D693 Immune thrombocytopenic purpura: Secondary | ICD-10-CM

## 2019-04-07 DIAGNOSIS — Z79899 Other long term (current) drug therapy: Secondary | ICD-10-CM

## 2019-04-07 DIAGNOSIS — L80 Vitiligo: Secondary | ICD-10-CM

## 2019-04-07 DIAGNOSIS — D508 Other iron deficiency anemias: Secondary | ICD-10-CM

## 2019-04-07 LAB — CBC WITH DIFFERENTIAL (CANCER CENTER ONLY)
Abs Immature Granulocytes: 0.03 10*3/uL (ref 0.00–0.07)
Basophils Absolute: 0.1 10*3/uL (ref 0.0–0.1)
Basophils Relative: 1 %
Eosinophils Absolute: 0.1 10*3/uL (ref 0.0–0.5)
Eosinophils Relative: 1 %
HCT: 38.5 % (ref 36.0–46.0)
Hemoglobin: 12.6 g/dL (ref 12.0–15.0)
Immature Granulocytes: 0 %
Lymphocytes Relative: 29 %
Lymphs Abs: 2.5 10*3/uL (ref 0.7–4.0)
MCH: 32.8 pg (ref 26.0–34.0)
MCHC: 32.7 g/dL (ref 30.0–36.0)
MCV: 100.3 fL — ABNORMAL HIGH (ref 80.0–100.0)
Monocytes Absolute: 1.1 10*3/uL — ABNORMAL HIGH (ref 0.1–1.0)
Monocytes Relative: 12 %
Neutro Abs: 4.9 10*3/uL (ref 1.7–7.7)
Neutrophils Relative %: 57 %
Platelet Count: 402 10*3/uL — ABNORMAL HIGH (ref 150–400)
RBC: 3.84 MIL/uL — ABNORMAL LOW (ref 3.87–5.11)
RDW: 13.2 % (ref 11.5–15.5)
WBC Count: 8.7 10*3/uL (ref 4.0–10.5)
nRBC: 0 % (ref 0.0–0.2)

## 2019-04-07 LAB — CMP (CANCER CENTER ONLY)
ALT: 9 U/L (ref 0–44)
AST: 17 U/L (ref 15–41)
Albumin: 4 g/dL (ref 3.5–5.0)
Alkaline Phosphatase: 65 U/L (ref 38–126)
Anion gap: 10 (ref 5–15)
BUN: 9 mg/dL (ref 8–23)
CO2: 29 mmol/L (ref 22–32)
Calcium: 9.5 mg/dL (ref 8.9–10.3)
Chloride: 99 mmol/L (ref 98–111)
Creatinine: 0.54 mg/dL (ref 0.44–1.00)
GFR, Est AFR Am: >60 mL/min (ref >60)
GFR, Estimated: >60 mL/min (ref >60)
Glucose, Bld: 100 mg/dL — ABNORMAL HIGH (ref 70–99)
Potassium: 3.6 mmol/L (ref 3.5–5.1)
Sodium: 138 mmol/L (ref 135–145)
Total Bilirubin: 0.5 mg/dL (ref 0.3–1.2)
Total Protein: 6.9 g/dL (ref 6.5–8.1)

## 2019-04-07 LAB — VITAMIN B12: Vitamin B-12: 274 pg/mL (ref 180–914)

## 2019-04-07 LAB — SAVE SMEAR(SSMR), FOR PROVIDER SLIDE REVIEW

## 2019-04-07 NOTE — Progress Notes (Signed)
Hematology and Oncology Follow Up Visit  TARSHA BLANDO [id number] July 27, 1941 78 y.o. 04/07/2019   Principle Diagnosis:  Chronic immune thrombocytopenia, remission. 2. Pernicious anemia. 3. Vitiligo. 4. Rheumatoid Arthritis  Current Therapy:    Vitamin B12 1 mg IM Q. month      Interim History:  Ms.  Peeters is back for follow-up.  I must say that she definitely looks a lot different than when we last saw her.  She has lost quite a bit of weight.  She has lost about 20 pounds.  After we last saw her, she apparently had pneumonia or some type of illness.  She did not do all that well.  She again lost quite a bit of weight.  She still does not feel that well.  He feels tired.  He does not have a lot of energy.  I am not sure if the rheumatoid arthritis is causing her problems.  She is doing her B12 injections at home.  On exam, looks like might feel some adenopathy in the right axilla.  I will have to get a CT scan to see what is going on.  She has had no fever.  She has had no change in bowel or bladder habits.  Currently, her performance status is ECOG 1.   Medications:  Current Outpatient Medications:  .  acetaminophen (TYLENOL) 325 MG tablet, Take 650 mg by mouth every 6 (six) hours as needed.  , Disp: , Rfl:  .  albuterol (PROVENTIL HFA;VENTOLIN HFA) 108 (90 Base) MCG/ACT inhaler, Inhale into the lungs., Disp: , Rfl:  .  alendronate (FOSAMAX) 70 MG tablet, Take 1 tablet po weekly.  Stay upright for 1 hour following, Disp: , Rfl:  .  CALCIUM-VITAMIN D PO, Take 1 capsule by mouth every morning. Calcium 600 mg  Vitamin D 200, Disp: , Rfl:  .  Cholecalciferol (VITAMIN D) 2000 UNITS tablet, Take 2,000 Units by mouth daily., Disp: , Rfl:  .  cyanocobalamin (,VITAMIN B-12,) 1000 MCG/ML injection, INJECT 1 ML ONCE MONTHLY, Disp: 3 mL, Rfl: 24 .  cycloSPORINE (RESTASIS) 0.05 % ophthalmic emulsion, 1 drop 2 times daily., Disp: , Rfl:  .  esomeprazole (NEXIUM) 20 MG capsule, Take  20 mg by mouth daily at 12 noon., Disp: , Rfl:  .  hydrocortisone 2.5 % cream, Apply to affected area three times a day as needed., Disp: , Rfl:  .  hydroxychloroquine (PLAQUENIL) 200 MG tablet, Take 2 tablets by mouth with food or milk once a day, Disp: , Rfl:  .  Krill Oil 500 MG CAPS, Take by mouth every morning., Disp: , Rfl:  .  leflunomide (ARAVA) 20 MG tablet, Take 10 mg by mouth daily., Disp: , Rfl:  .  levothyroxine (SYNTHROID, LEVOTHROID) 125 MCG tablet, TAKE 1 AND 1/2 TABLETS 1 DAY WEEKLY, 1 TABLET 6 DAYS WEEKLY, Disp: , Rfl:  .  Misc Natural Products (TUMERSAID) TABS, Take by mouth., Disp: , Rfl:  .  Omega-3 Fatty Acids (OMEGA-3 FISH OIL PO), Take by mouth., Disp: , Rfl:  .  omeprazole (PRILOSEC) 20 MG capsule, Take 20 mg by mouth as needed., Disp: , Rfl:  .  sertraline (ZOLOFT) 50 MG tablet, Take 50 mg by mouth daily.  , Disp: , Rfl:  .  umeclidinium-vilanterol (ANORO ELLIPTA) 62.5-25 MCG/INH AEPB, Inhale into the lungs., Disp: , Rfl:  .  valsartan (DIOVAN) 320 MG tablet, Take by mouth., Disp: , Rfl:   Allergies:  Allergies  Allergen Reactions  . Iodinated  Diagnostic Agents Swelling  . Sulfa Antibiotics Other (See Comments)  . Penicillins   . Quinine Derivatives   . Doxycycline Nausea Only    Past Medical History, Surgical history, Social history, and Family History were reviewed and updated.  Review of Systems: Review of Systems  Constitutional: Positive for malaise/fatigue.  HENT: Positive for congestion.   Eyes: Negative.   Respiratory: Positive for cough.   Cardiovascular: Positive for palpitations.  Gastrointestinal: Positive for diarrhea and nausea.  Genitourinary: Negative.   Musculoskeletal: Positive for joint pain and myalgias.  Skin: Negative.   Neurological: Negative.   Endo/Heme/Allergies: Negative.   Psychiatric/Behavioral: Negative.      Physical Exam:  weight is 110 lb (49.9 kg). Her oral temperature is 98.4 F (36.9 C). Her blood pressure is  165/74 (abnormal) and her pulse is 90. Her respiration is 18 and oxygen saturation is 95%.   Physical Exam Vitals signs reviewed.  HENT:     Head: Normocephalic and atraumatic.  Eyes:     Pupils: Pupils are equal, round, and reactive to light.  Neck:     Musculoskeletal: Normal range of motion.  Cardiovascular:     Rate and Rhythm: Normal rate and regular rhythm.     Heart sounds: Normal heart sounds.  Pulmonary:     Effort: Pulmonary effort is normal.     Breath sounds: Normal breath sounds.  Abdominal:     General: Bowel sounds are normal.     Palpations: Abdomen is soft.  Musculoskeletal: Normal range of motion.        General: No tenderness or deformity.  Lymphadenopathy:     Cervical: No cervical adenopathy.  Skin:    General: Skin is warm and dry.     Findings: No erythema or rash.  Neurological:     Mental Status: She is alert and oriented to person, place, and time.  Psychiatric:        Behavior: Behavior normal.        Thought Content: Thought content normal.        Judgment: Judgment normal.      Lab Results  Component Value Date   WBC 8.7 04/07/2019   HGB 12.6 04/07/2019   HCT 38.5 04/07/2019   MCV 100.3 (H) 04/07/2019   PLT 402 (H) 04/07/2019     Chemistry      Component Value Date/Time   NA 138 04/07/2019 1154   NA 144 09/11/2017 1335   NA 138 07/31/2016 0947   K 3.6 04/07/2019 1154   K 4.2 09/11/2017 1335   K 4.3 07/31/2016 0947   CL 99 04/07/2019 1154   CL 104 09/11/2017 1335   CO2 29 04/07/2019 1154   CO2 28 09/11/2017 1335   CO2 27 07/31/2016 0947   BUN 9 04/07/2019 1154   BUN 17 09/11/2017 1335   BUN 14.7 07/31/2016 0947   CREATININE 0.54 04/07/2019 1154   CREATININE 0.9 09/11/2017 1335   CREATININE 0.7 07/31/2016 0947      Component Value Date/Time   CALCIUM 9.5 04/07/2019 1154   CALCIUM 9.0 09/11/2017 1335   CALCIUM 9.2 07/31/2016 0947   ALKPHOS 65 04/07/2019 1154   ALKPHOS 102 (H) 09/11/2017 1335   ALKPHOS 70 07/31/2016 0947    AST 17 04/07/2019 1154   AST 18 07/31/2016 0947   ALT 9 04/07/2019 1154   ALT 20 09/11/2017 1335   ALT 9 07/31/2016 0947   BILITOT 0.5 04/07/2019 1154   BILITOT 0.62 07/31/2016 0947  Impression and Plan: Ms. Paiz is 78 year old white female. She has a clear autoimmune issue. She has pernicious anemia. She has vitiligo. She has hypo-thyroidism.  We will have to see what the CT scan shows.  Hopefully, we will not find anything in that she will just slowly recover from what ever, she had back in the wintertime.  I just feel bad that she is lost all this weight.  She is not that big to begin with.  As such, any weight loss certainly exacerbated.  I spent about 35 minutes with her today.  I was quite disturbed by the weight loss.  I did take more time with her to try to figure out what is going on and make sure we try to do the proper testing.  I will see her back in a month.  Volanda Napoleon, MD 6/18/202012:45 PM

## 2019-04-08 LAB — IRON AND TIBC
Iron: 59 ug/dL (ref 41–142)
Saturation Ratios: 18 % — ABNORMAL LOW (ref 21–57)
TIBC: 333 ug/dL (ref 236–444)
UIBC: 273 ug/dL (ref 120–384)

## 2019-04-08 LAB — FERRITIN: Ferritin: 29 ng/mL (ref 11–307)

## 2019-04-08 LAB — LACTATE DEHYDROGENASE: LDH: 196 U/L — ABNORMAL HIGH (ref 98–192)

## 2019-04-12 ENCOUNTER — Other Ambulatory Visit: Payer: Self-pay

## 2019-04-12 ENCOUNTER — Ambulatory Visit (HOSPITAL_BASED_OUTPATIENT_CLINIC_OR_DEPARTMENT_OTHER)
Admission: RE | Admit: 2019-04-12 | Discharge: 2019-04-12 | Disposition: A | Discharge status: Home / Self Care | Payer: PPO | Source: Ambulatory Visit | Attending: Hematology & Oncology | Admitting: Hematology & Oncology

## 2019-04-12 DIAGNOSIS — R634 Abnormal weight loss: Secondary | ICD-10-CM

## 2019-04-12 DIAGNOSIS — K5730 Diverticulosis of large intestine without perforation or abscess without bleeding: Secondary | ICD-10-CM | POA: Diagnosis not present

## 2019-04-12 DIAGNOSIS — J432 Centrilobular emphysema: Secondary | ICD-10-CM | POA: Diagnosis not present

## 2019-04-12 DIAGNOSIS — J439 Emphysema, unspecified: Secondary | ICD-10-CM | POA: Diagnosis not present

## 2019-04-13 ENCOUNTER — Inpatient Hospital Stay: Payer: PPO

## 2019-04-13 ENCOUNTER — Telehealth: Payer: Self-pay | Admitting: Hematology & Oncology

## 2019-04-13 NOTE — Telephone Encounter (Signed)
Pt called to have labs faxed to: [facility], [address], [place], [place] [address] [phone]  F: [phone]

## 2019-04-14 ENCOUNTER — Other Ambulatory Visit: Payer: Self-pay

## 2019-04-14 ENCOUNTER — Other Ambulatory Visit: Payer: Self-pay | Admitting: Hematology & Oncology

## 2019-04-14 ENCOUNTER — Inpatient Hospital Stay: Payer: PPO

## 2019-04-14 VITALS — BP 158/57 | HR 74 | Temp 98.3°F | Resp 17

## 2019-04-14 DIAGNOSIS — D500 Iron deficiency anemia secondary to blood loss (chronic): Secondary | ICD-10-CM

## 2019-04-14 DIAGNOSIS — D693 Immune thrombocytopenic purpura: Secondary | ICD-10-CM | POA: Diagnosis not present

## 2019-04-14 DIAGNOSIS — R911 Solitary pulmonary nodule: Secondary | ICD-10-CM

## 2019-04-14 MED ORDER — SODIUM CHLORIDE 0.9 % IV SOLN
510.0000 mg | Freq: Once | INTRAVENOUS | Status: AC
  Administered 2019-04-14: 510 mg via INTRAVENOUS
  Filled 2019-04-14: qty 17

## 2019-04-14 MED ORDER — SODIUM CHLORIDE 0.9 % IV SOLN
Freq: Once | INTRAVENOUS | Status: AC
  Administered 2019-04-14: 14:00:00 via INTRAVENOUS
  Filled 2019-04-14: qty 250

## 2019-04-14 NOTE — Patient Instructions (Signed)

## 2019-04-14 NOTE — Progress Notes (Unsigned)
Ct chest

## 2019-04-27 DIAGNOSIS — Z79899 Other long term (current) drug therapy: Secondary | ICD-10-CM | POA: Diagnosis not present

## 2019-04-27 DIAGNOSIS — R634 Abnormal weight loss: Secondary | ICD-10-CM | POA: Diagnosis not present

## 2019-04-27 DIAGNOSIS — M2550 Pain in unspecified joint: Secondary | ICD-10-CM | POA: Diagnosis not present

## 2019-04-27 DIAGNOSIS — M3500 Sicca syndrome, unspecified: Secondary | ICD-10-CM | POA: Diagnosis not present

## 2019-04-27 DIAGNOSIS — M0589 Other rheumatoid arthritis with rheumatoid factor of multiple sites: Secondary | ICD-10-CM | POA: Diagnosis not present

## 2019-04-27 DIAGNOSIS — M545 Low back pain: Secondary | ICD-10-CM | POA: Diagnosis not present

## 2019-04-27 DIAGNOSIS — Z681 Body mass index (BMI) 19 or less, adult: Secondary | ICD-10-CM | POA: Diagnosis not present

## 2019-05-11 DIAGNOSIS — R634 Abnormal weight loss: Secondary | ICD-10-CM | POA: Diagnosis not present

## 2019-05-11 DIAGNOSIS — J439 Emphysema, unspecified: Secondary | ICD-10-CM | POA: Diagnosis not present

## 2019-05-11 DIAGNOSIS — I10 Essential (primary) hypertension: Secondary | ICD-10-CM | POA: Diagnosis not present

## 2019-05-11 DIAGNOSIS — R197 Diarrhea, unspecified: Secondary | ICD-10-CM | POA: Diagnosis not present

## 2019-05-11 DIAGNOSIS — G8929 Other chronic pain: Secondary | ICD-10-CM | POA: Diagnosis not present

## 2019-05-11 DIAGNOSIS — D693 Immune thrombocytopenic purpura: Secondary | ICD-10-CM | POA: Diagnosis not present

## 2019-05-11 DIAGNOSIS — F411 Generalized anxiety disorder: Secondary | ICD-10-CM | POA: Diagnosis not present

## 2019-05-11 DIAGNOSIS — M25571 Pain in right ankle and joints of right foot: Secondary | ICD-10-CM | POA: Diagnosis not present

## 2019-05-11 DIAGNOSIS — E039 Hypothyroidism, unspecified: Secondary | ICD-10-CM | POA: Diagnosis not present

## 2019-05-16 ENCOUNTER — Ambulatory Visit (HOSPITAL_BASED_OUTPATIENT_CLINIC_OR_DEPARTMENT_OTHER)
Admission: RE | Admit: 2019-05-16 | Discharge: 2019-05-16 | Disposition: A | Discharge status: Home / Self Care | Payer: PPO | Source: Ambulatory Visit | Attending: Hematology & Oncology | Admitting: Hematology & Oncology

## 2019-05-16 ENCOUNTER — Inpatient Hospital Stay: Payer: PPO | Attending: Hematology & Oncology | Admitting: Hematology & Oncology

## 2019-05-16 ENCOUNTER — Inpatient Hospital Stay: Payer: PPO

## 2019-05-16 ENCOUNTER — Other Ambulatory Visit: Payer: Self-pay

## 2019-05-16 ENCOUNTER — Encounter: Payer: Self-pay | Admitting: Hematology & Oncology

## 2019-05-16 VITALS — BP 126/73 | HR 105 | Temp 99.6°F | Resp 18 | Wt 107.0 lb

## 2019-05-16 DIAGNOSIS — D693 Immune thrombocytopenic purpura: Secondary | ICD-10-CM | POA: Diagnosis not present

## 2019-05-16 DIAGNOSIS — R634 Abnormal weight loss: Secondary | ICD-10-CM

## 2019-05-16 DIAGNOSIS — R0981 Nasal congestion: Secondary | ICD-10-CM | POA: Insufficient documentation

## 2019-05-16 DIAGNOSIS — Z79899 Other long term (current) drug therapy: Secondary | ICD-10-CM | POA: Diagnosis not present

## 2019-05-16 DIAGNOSIS — D508 Other iron deficiency anemias: Secondary | ICD-10-CM

## 2019-05-16 DIAGNOSIS — M419 Scoliosis, unspecified: Secondary | ICD-10-CM | POA: Insufficient documentation

## 2019-05-16 DIAGNOSIS — R911 Solitary pulmonary nodule: Secondary | ICD-10-CM | POA: Diagnosis not present

## 2019-05-16 DIAGNOSIS — L80 Vitiligo: Secondary | ICD-10-CM | POA: Diagnosis not present

## 2019-05-16 DIAGNOSIS — R0602 Shortness of breath: Secondary | ICD-10-CM | POA: Insufficient documentation

## 2019-05-16 DIAGNOSIS — E039 Hypothyroidism, unspecified: Secondary | ICD-10-CM | POA: Insufficient documentation

## 2019-05-16 DIAGNOSIS — R531 Weakness: Secondary | ICD-10-CM | POA: Diagnosis not present

## 2019-05-16 DIAGNOSIS — R918 Other nonspecific abnormal finding of lung field: Secondary | ICD-10-CM | POA: Insufficient documentation

## 2019-05-16 DIAGNOSIS — D510 Vitamin B12 deficiency anemia due to intrinsic factor deficiency: Secondary | ICD-10-CM | POA: Diagnosis not present

## 2019-05-16 DIAGNOSIS — R05 Cough: Secondary | ICD-10-CM | POA: Diagnosis not present

## 2019-05-16 DIAGNOSIS — R5383 Other fatigue: Secondary | ICD-10-CM | POA: Diagnosis not present

## 2019-05-16 DIAGNOSIS — M069 Rheumatoid arthritis, unspecified: Secondary | ICD-10-CM | POA: Diagnosis not present

## 2019-05-16 LAB — CMP (CANCER CENTER ONLY)
ALT: 10 U/L (ref 0–44)
AST: 18 U/L (ref 15–41)
Albumin: 3.7 g/dL (ref 3.5–5.0)
Alkaline Phosphatase: 61 U/L (ref 38–126)
Anion gap: 8 (ref 5–15)
BUN: 13 mg/dL (ref 8–23)
CO2: 27 mmol/L (ref 22–32)
Calcium: 8.5 mg/dL — ABNORMAL LOW (ref 8.9–10.3)
Chloride: 98 mmol/L (ref 98–111)
Creatinine: 0.55 mg/dL (ref 0.44–1.00)
GFR, Est AFR Am: >60 mL/min (ref >60)
GFR, Estimated: >60 mL/min (ref >60)
Glucose, Bld: 126 mg/dL — ABNORMAL HIGH (ref 70–99)
Potassium: 4 mmol/L (ref 3.5–5.1)
Sodium: 133 mmol/L — ABNORMAL LOW (ref 135–145)
Total Bilirubin: 0.5 mg/dL (ref 0.3–1.2)
Total Protein: 6.6 g/dL (ref 6.5–8.1)

## 2019-05-16 LAB — CBC WITH DIFFERENTIAL (CANCER CENTER ONLY)
Abs Immature Granulocytes: 0.02 10*3/uL (ref 0.00–0.07)
Basophils Absolute: 0.1 10*3/uL (ref 0.0–0.1)
Basophils Relative: 1 %
Eosinophils Absolute: 0.1 10*3/uL (ref 0.0–0.5)
Eosinophils Relative: 1 %
HCT: 39.4 % (ref 36.0–46.0)
Hemoglobin: 12.8 g/dL (ref 12.0–15.0)
Immature Granulocytes: 0 %
Lymphocytes Relative: 28 %
Lymphs Abs: 2.2 10*3/uL (ref 0.7–4.0)
MCH: 33.1 pg (ref 26.0–34.0)
MCHC: 32.5 g/dL (ref 30.0–36.0)
MCV: 101.8 fL — ABNORMAL HIGH (ref 80.0–100.0)
Monocytes Absolute: 1.2 10*3/uL — ABNORMAL HIGH (ref 0.1–1.0)
Monocytes Relative: 15 %
Neutro Abs: 4.4 10*3/uL (ref 1.7–7.7)
Neutrophils Relative %: 55 %
Platelet Count: 400 10*3/uL (ref 150–400)
RBC: 3.87 MIL/uL (ref 3.87–5.11)
RDW: 13.7 % (ref 11.5–15.5)
WBC Count: 8 10*3/uL (ref 4.0–10.5)
nRBC: 0 % (ref 0.0–0.2)

## 2019-05-16 LAB — VITAMIN B12: Vitamin B-12: 325 pg/mL (ref 180–914)

## 2019-05-16 MED ORDER — MEGESTROL ACETATE 400 MG/10ML PO SUSP: 400.0000 mg | Freq: Two times a day (BID) | ORAL | 6 refills | Status: DC

## 2019-05-16 NOTE — Progress Notes (Signed)
Hematology and Oncology Follow Up Visit  Kathleen Pace [id number] [birth year] 78 y.o. 05/16/2019   Principle Diagnosis:  Chronic immune thrombocytopenia, remission. 2. Pernicious anemia. 3. Vitiligo. 4. Rheumatoid Arthritis  Current Therapy:    Vitamin B12 1 mg IM Q. month      Interim History:  Kathleen Pace is back for follow-up.  She still is losing some weight.  We last saw her, her weight was 110 pounds.  She is now down to 107 pounds.  We did go ahead and get a CAT scan on her today.  CAT scan is little bit troublesome.  There is some issues with her lungs.  She has severe changes of emphysema.  She has bronchiectasis.  She has a masslike distortion in the right posterior lung measuring 1.7 x 3.5 cm.  She has a right middle lobe lung nodule measuring 6 mm.  Other lung nodules were also noted.  She has some scoliosis with compression fractures at T8, T10 and T12.  I think we probably need to get a PET scan on her.  Given that she has had history of tobacco use, we cannot totally rule out lung cancer.  I talked her about this.  We reviewed the CT scan.  This is a little bit complicated given her marginal performance status.  I will try her on some Megace elixir to see if this will help with her appetite and try to get her weight back up.  She has had no problems with bleeding.  She has had no nausea or vomiting.  There she has some chronic shortness of breath.  She has a chronic cough that is nonproductive.  I told her that with bronchiectasis, we always could try IVIG which can sometimes help.  Currently, her performance status is ECOG 1.   Medications:  Current Outpatient Medications:  .  acetaminophen (TYLENOL) 325 MG tablet, Take 650 mg by mouth every 6 (six) hours as needed.  , Disp: , Rfl:  .  albuterol (PROVENTIL HFA;VENTOLIN HFA) 108 (90 Base) MCG/ACT inhaler, Inhale into the lungs., Disp: , Rfl:  .  alendronate (FOSAMAX) 70 MG tablet, Take 1 tablet po weekly.  Stay  upright for 1 hour following, Disp: , Rfl:  .  CALCIUM-VITAMIN D PO, Take 1 capsule by mouth every morning. Calcium 600 mg  Vitamin D 200, Disp: , Rfl:  .  Cholecalciferol (VITAMIN D) 2000 UNITS tablet, Take 2,000 Units by mouth daily., Disp: , Rfl:  .  cyanocobalamin (,VITAMIN B-12,) 1000 MCG/ML injection, INJECT 1 ML ONCE MONTHLY, Disp: 3 mL, Rfl: 24 .  cycloSPORINE (RESTASIS) 0.05 % ophthalmic emulsion, 1 drop 2 times daily., Disp: , Rfl:  .  hydrocortisone 2.5 % cream, Apply to affected area three times a day as needed., Disp: , Rfl:  .  hydroxychloroquine (PLAQUENIL) 200 MG tablet, 200 mg daily. , Disp: , Rfl:  .  leflunomide (ARAVA) 10 MG tablet, Take 10 mg by mouth daily. , Disp: , Rfl:  .  levothyroxine (SYNTHROID, LEVOTHROID) 125 MCG tablet, 05/16/2019 Takes 1.5 tab 3 times weekly, 1 tablet the other 4 days., Disp: , Rfl:  .  omeprazole (PRILOSEC) 20 MG capsule, Take 20 mg by mouth as needed., Disp: , Rfl:  .  sertraline (ZOLOFT) 50 MG tablet, Take 50 mg by mouth daily.  , Disp: , Rfl:  .  umeclidinium-vilanterol (ANORO ELLIPTA) 62.5-25 MCG/INH AEPB, Inhale into the lungs daily. , Disp: , Rfl:  .  valsartan (DIOVAN) 320 MG  tablet, Take by mouth daily. , Disp: , Rfl:  .  esomeprazole (NEXIUM) 20 MG capsule, Take 20 mg by mouth daily at 12 noon., Disp: , Rfl:  .  Krill Oil 500 MG CAPS, Take by mouth every morning., Disp: , Rfl:  .  Misc Natural Products (TUMERSAID) TABS, Take by mouth., Disp: , Rfl:  .  Omega-3 Fatty Acids (OMEGA-3 FISH OIL PO), Take by mouth., Disp: , Rfl:   Allergies:  Allergies  Allergen Reactions  . Iodinated Diagnostic Agents Swelling  . Sulfa Antibiotics Other (See Comments)  . Penicillins   . Quinine Derivatives   . Doxycycline Nausea Only    Past Medical History, Surgical history, Social history, and Family History were reviewed and updated.  Review of Systems: Review of Systems  Constitutional: Positive for malaise/fatigue.  HENT: Positive for  congestion.   Eyes: Negative.   Respiratory: Positive for cough.   Cardiovascular: Positive for palpitations.  Gastrointestinal: Positive for diarrhea and nausea.  Genitourinary: Negative.   Musculoskeletal: Positive for joint pain and myalgias.  Skin: Negative.   Neurological: Negative.   Endo/Heme/Allergies: Negative.   Psychiatric/Behavioral: Negative.      Physical Exam:  weight is 107 lb (48.5 kg). Her oral temperature is 99.6 F (37.6 C). Her blood pressure is 126/73 and her pulse is 105 (abnormal). Her respiration is 18 and oxygen saturation is 96%.   Physical Exam Vitals signs reviewed.  HENT:     Head: Normocephalic and atraumatic.  Eyes:     Pupils: Pupils are equal, round, and reactive to light.  Neck:     Musculoskeletal: Normal range of motion.  Cardiovascular:     Rate and Rhythm: Normal rate and regular rhythm.     Heart sounds: Normal heart sounds.  Pulmonary:     Effort: Pulmonary effort is normal.     Breath sounds: Normal breath sounds.  Abdominal:     General: Bowel sounds are normal.     Palpations: Abdomen is soft.  Musculoskeletal: Normal range of motion.        General: No tenderness or deformity.  Lymphadenopathy:     Cervical: No cervical adenopathy.  Skin:    General: Skin is warm and dry.     Findings: No erythema or rash.  Neurological:     Mental Status: She is alert and oriented to person, place, and time.  Psychiatric:        Behavior: Behavior normal.        Thought Content: Thought content normal.        Judgment: Judgment normal.      Lab Results  Component Value Date   WBC 8.0 05/16/2019   HGB 12.8 05/16/2019   HCT 39.4 05/16/2019   MCV 101.8 (H) 05/16/2019   PLT 400 05/16/2019     Chemistry      Component Value Date/Time   NA 133 (L) 05/16/2019 1429   NA 144 09/11/2017 1335   NA 138 07/31/2016 0947   K 4.0 05/16/2019 1429   K 4.2 09/11/2017 1335   K 4.3 07/31/2016 0947   CL 98 05/16/2019 1429   CL 104  09/11/2017 1335   CO2 27 05/16/2019 1429   CO2 28 09/11/2017 1335   CO2 27 07/31/2016 0947   BUN 13 05/16/2019 1429   BUN 17 09/11/2017 1335   BUN 14.7 07/31/2016 0947   CREATININE 0.55 05/16/2019 1429   CREATININE 0.9 09/11/2017 1335   CREATININE 0.7 07/31/2016 0947  Component Value Date/Time   CALCIUM 8.5 (L) 05/16/2019 1429   CALCIUM 9.0 09/11/2017 1335   CALCIUM 9.2 07/31/2016 0947   ALKPHOS 61 05/16/2019 1429   ALKPHOS 102 (H) 09/11/2017 1335   ALKPHOS 70 07/31/2016 0947   AST 18 05/16/2019 1429   AST 18 07/31/2016 0947   ALT 10 05/16/2019 1429   ALT 20 09/11/2017 1335   ALT 9 07/31/2016 0947   BILITOT 0.5 05/16/2019 1429   BILITOT 0.62 07/31/2016 0947         Impression and Plan: Kathleen Pace is 78 year old white female. She has a clear autoimmune issue. She has pernicious anemia. She has vitiligo. She has hypo-thyroidism.  Again, the PET scan of the will be critical.  We will try to get this in a couple weeks.  I did talk to her about the use of IVIG.  I explained how this can work out this might be able to help with any issues with bronchiectasis.  I would like to see her back in about a month.  I spent about 30 minutes with her today.  Again, this is somewhat complicated given her medical problems and the results of the CT scan.    Volanda Napoleon, MD 7/27/20203:50 PM

## 2019-05-17 ENCOUNTER — Telehealth: Payer: Self-pay | Admitting: Hematology & Oncology

## 2019-05-17 LAB — IRON AND TIBC
Iron: 63 ug/dL (ref 41–142)
Saturation Ratios: 27 % (ref 21–57)
TIBC: 231 ug/dL — ABNORMAL LOW (ref 236–444)
UIBC: 168 ug/dL (ref 120–384)

## 2019-05-17 LAB — FERRITIN: Ferritin: 297 ng/mL (ref 11–307)

## 2019-05-17 LAB — TSH: TSH: <0.08 u[IU]/mL — ABNORMAL LOW (ref 0.308–3.960)

## 2019-05-17 NOTE — Telephone Encounter (Signed)
Spoke with patient husband to confirm August appointments per 7/27 LOS

## 2019-05-18 DIAGNOSIS — R197 Diarrhea, unspecified: Secondary | ICD-10-CM | POA: Diagnosis not present

## 2019-05-18 DIAGNOSIS — J439 Emphysema, unspecified: Secondary | ICD-10-CM | POA: Diagnosis not present

## 2019-05-18 DIAGNOSIS — Z1159 Encounter for screening for other viral diseases: Secondary | ICD-10-CM | POA: Diagnosis not present

## 2019-05-18 DIAGNOSIS — E039 Hypothyroidism, unspecified: Secondary | ICD-10-CM | POA: Diagnosis not present

## 2019-05-18 DIAGNOSIS — R634 Abnormal weight loss: Secondary | ICD-10-CM | POA: Diagnosis not present

## 2019-05-18 DIAGNOSIS — I10 Essential (primary) hypertension: Secondary | ICD-10-CM | POA: Diagnosis not present

## 2019-05-18 DIAGNOSIS — G8929 Other chronic pain: Secondary | ICD-10-CM | POA: Diagnosis not present

## 2019-05-18 DIAGNOSIS — M25571 Pain in right ankle and joints of right foot: Secondary | ICD-10-CM | POA: Diagnosis not present

## 2019-05-18 DIAGNOSIS — F411 Generalized anxiety disorder: Secondary | ICD-10-CM | POA: Diagnosis not present

## 2019-05-18 DIAGNOSIS — D693 Immune thrombocytopenic purpura: Secondary | ICD-10-CM | POA: Diagnosis not present

## 2019-05-25 DIAGNOSIS — G8929 Other chronic pain: Secondary | ICD-10-CM | POA: Diagnosis not present

## 2019-05-25 DIAGNOSIS — M25571 Pain in right ankle and joints of right foot: Secondary | ICD-10-CM | POA: Diagnosis not present

## 2019-05-25 DIAGNOSIS — T8484XA Pain due to internal orthopedic prosthetic devices, implants and grafts, initial encounter: Secondary | ICD-10-CM | POA: Diagnosis not present

## 2019-05-25 DIAGNOSIS — L97319 Non-pressure chronic ulcer of right ankle with unspecified severity: Secondary | ICD-10-CM | POA: Diagnosis not present

## 2019-05-30 ENCOUNTER — Ambulatory Visit (HOSPITAL_COMMUNITY): Payer: PPO

## 2019-06-02 ENCOUNTER — Other Ambulatory Visit: Payer: Self-pay

## 2019-06-02 ENCOUNTER — Ambulatory Visit (HOSPITAL_COMMUNITY)
Admission: RE | Admit: 2019-06-02 | Discharge: 2019-06-02 | Disposition: A | Discharge status: Home / Self Care | Payer: PPO | Source: Ambulatory Visit | Attending: Hematology & Oncology | Admitting: Hematology & Oncology

## 2019-06-02 DIAGNOSIS — I700 Atherosclerosis of aorta: Secondary | ICD-10-CM | POA: Diagnosis not present

## 2019-06-02 DIAGNOSIS — D693 Immune thrombocytopenic purpura: Secondary | ICD-10-CM

## 2019-06-02 DIAGNOSIS — R918 Other nonspecific abnormal finding of lung field: Secondary | ICD-10-CM | POA: Insufficient documentation

## 2019-06-02 DIAGNOSIS — I2510 Atherosclerotic heart disease of native coronary artery without angina pectoris: Secondary | ICD-10-CM | POA: Insufficient documentation

## 2019-06-02 DIAGNOSIS — J439 Emphysema, unspecified: Secondary | ICD-10-CM | POA: Insufficient documentation

## 2019-06-02 LAB — GLUCOSE, CAPILLARY: Glucose-Capillary: 100 mg/dL — ABNORMAL HIGH (ref 70–99)

## 2019-06-02 MED ORDER — FLUDEOXYGLUCOSE F - 18 (FDG) INJECTION
5.6000 | Freq: Once | INTRAVENOUS | Status: AC | PRN
  Administered 2019-06-02: 5.6 via INTRAVENOUS

## 2019-06-03 ENCOUNTER — Telehealth: Payer: Self-pay | Admitting: *Deleted

## 2019-06-03 NOTE — Telephone Encounter (Signed)
As noted below by Dr. Marin Olp, I informed the patient that there is no obvious cancer, but he wants to follow the area in the lung closely. She verbalized understanding.

## 2019-06-03 NOTE — Telephone Encounter (Signed)
-----   Message from Volanda Napoleon, MD sent at 06/02/2019  4:40 PM EDT ----- Call - there is no obvious cancer, but we need to follow this area in your lung closely!!! [name]

## 2019-06-08 ENCOUNTER — Telehealth: Payer: Self-pay | Admitting: Hematology & Oncology

## 2019-06-08 NOTE — Telephone Encounter (Signed)
Called and spoke with patient about rescheduling her August appointments until September.  She was ok with new date/time

## 2019-06-16 ENCOUNTER — Other Ambulatory Visit: Payer: PPO

## 2019-06-16 ENCOUNTER — Ambulatory Visit: Payer: PPO | Admitting: Hematology & Oncology

## 2019-06-29 DIAGNOSIS — M7989 Other specified soft tissue disorders: Secondary | ICD-10-CM | POA: Diagnosis not present

## 2019-06-29 DIAGNOSIS — Z6820 Body mass index (BMI) 20.0-20.9, adult: Secondary | ICD-10-CM | POA: Diagnosis not present

## 2019-06-29 DIAGNOSIS — R634 Abnormal weight loss: Secondary | ICD-10-CM | POA: Diagnosis not present

## 2019-06-29 DIAGNOSIS — M545 Low back pain: Secondary | ICD-10-CM | POA: Diagnosis not present

## 2019-06-29 DIAGNOSIS — M3500 Sicca syndrome, unspecified: Secondary | ICD-10-CM | POA: Diagnosis not present

## 2019-06-29 DIAGNOSIS — M0589 Other rheumatoid arthritis with rheumatoid factor of multiple sites: Secondary | ICD-10-CM | POA: Diagnosis not present

## 2019-06-29 DIAGNOSIS — H539 Unspecified visual disturbance: Secondary | ICD-10-CM | POA: Diagnosis not present

## 2019-06-29 DIAGNOSIS — M2550 Pain in unspecified joint: Secondary | ICD-10-CM | POA: Diagnosis not present

## 2019-06-29 DIAGNOSIS — Z79899 Other long term (current) drug therapy: Secondary | ICD-10-CM | POA: Diagnosis not present

## 2019-07-12 DIAGNOSIS — Z23 Encounter for immunization: Secondary | ICD-10-CM | POA: Diagnosis not present

## 2019-07-12 DIAGNOSIS — E039 Hypothyroidism, unspecified: Secondary | ICD-10-CM | POA: Diagnosis not present

## 2019-07-12 DIAGNOSIS — F411 Generalized anxiety disorder: Secondary | ICD-10-CM | POA: Diagnosis not present

## 2019-07-12 DIAGNOSIS — R634 Abnormal weight loss: Secondary | ICD-10-CM | POA: Diagnosis not present

## 2019-07-20 ENCOUNTER — Other Ambulatory Visit: Payer: Self-pay

## 2019-07-20 ENCOUNTER — Encounter: Payer: Self-pay | Admitting: Hematology & Oncology

## 2019-07-20 ENCOUNTER — Inpatient Hospital Stay: Payer: PPO | Attending: Hematology & Oncology

## 2019-07-20 ENCOUNTER — Telehealth: Payer: Self-pay | Admitting: Hematology & Oncology

## 2019-07-20 ENCOUNTER — Inpatient Hospital Stay (HOSPITAL_BASED_OUTPATIENT_CLINIC_OR_DEPARTMENT_OTHER): Payer: PPO | Admitting: Hematology & Oncology

## 2019-07-20 VITALS — BP 174/81 | HR 64 | Temp 97.7°F | Resp 18 | Ht 63.0 in | Wt 113.0 lb

## 2019-07-20 DIAGNOSIS — R5383 Other fatigue: Secondary | ICD-10-CM | POA: Insufficient documentation

## 2019-07-20 DIAGNOSIS — Z79899 Other long term (current) drug therapy: Secondary | ICD-10-CM | POA: Insufficient documentation

## 2019-07-20 DIAGNOSIS — D693 Immune thrombocytopenic purpura: Secondary | ICD-10-CM | POA: Diagnosis not present

## 2019-07-20 DIAGNOSIS — E039 Hypothyroidism, unspecified: Secondary | ICD-10-CM | POA: Diagnosis not present

## 2019-07-20 DIAGNOSIS — D510 Vitamin B12 deficiency anemia due to intrinsic factor deficiency: Secondary | ICD-10-CM

## 2019-07-20 DIAGNOSIS — R531 Weakness: Secondary | ICD-10-CM | POA: Insufficient documentation

## 2019-07-20 DIAGNOSIS — M069 Rheumatoid arthritis, unspecified: Secondary | ICD-10-CM | POA: Insufficient documentation

## 2019-07-20 DIAGNOSIS — L80 Vitiligo: Secondary | ICD-10-CM | POA: Diagnosis not present

## 2019-07-20 DIAGNOSIS — E538 Deficiency of other specified B group vitamins: Secondary | ICD-10-CM | POA: Insufficient documentation

## 2019-07-20 DIAGNOSIS — D501 Sideropenic dysphagia: Secondary | ICD-10-CM

## 2019-07-20 DIAGNOSIS — R911 Solitary pulmonary nodule: Secondary | ICD-10-CM | POA: Diagnosis not present

## 2019-07-20 DIAGNOSIS — R918 Other nonspecific abnormal finding of lung field: Secondary | ICD-10-CM

## 2019-07-20 LAB — CMP (CANCER CENTER ONLY)
ALT: 9 U/L (ref 0–44)
AST: 17 U/L (ref 15–41)
Albumin: 3.9 g/dL (ref 3.5–5.0)
Alkaline Phosphatase: 62 U/L (ref 38–126)
Anion gap: 7 (ref 5–15)
BUN: 12 mg/dL (ref 8–23)
CO2: 31 mmol/L (ref 22–32)
Calcium: 9.3 mg/dL (ref 8.9–10.3)
Chloride: 101 mmol/L (ref 98–111)
Creatinine: 0.49 mg/dL (ref 0.44–1.00)
GFR, Est AFR Am: >60 mL/min (ref >60)
GFR, Estimated: >60 mL/min (ref >60)
Glucose, Bld: 104 mg/dL — ABNORMAL HIGH (ref 70–99)
Potassium: 3.8 mmol/L (ref 3.5–5.1)
Sodium: 139 mmol/L (ref 135–145)
Total Bilirubin: 0.5 mg/dL (ref 0.3–1.2)
Total Protein: 6.8 g/dL (ref 6.5–8.1)

## 2019-07-20 LAB — CBC WITH DIFFERENTIAL (CANCER CENTER ONLY)
Abs Immature Granulocytes: 0.01 10*3/uL (ref 0.00–0.07)
Basophils Absolute: 0.1 10*3/uL (ref 0.0–0.1)
Basophils Relative: 1 %
Eosinophils Absolute: 0.1 10*3/uL (ref 0.0–0.5)
Eosinophils Relative: 2 %
HCT: 37.3 % (ref 36.0–46.0)
Hemoglobin: 12.3 g/dL (ref 12.0–15.0)
Immature Granulocytes: 0 %
Lymphocytes Relative: 46 %
Lymphs Abs: 3.1 10*3/uL (ref 0.7–4.0)
MCH: 34 pg (ref 26.0–34.0)
MCHC: 33 g/dL (ref 30.0–36.0)
MCV: 103 fL — ABNORMAL HIGH (ref 80.0–100.0)
Monocytes Absolute: 1 10*3/uL (ref 0.1–1.0)
Monocytes Relative: 16 %
Neutro Abs: 2.3 10*3/uL (ref 1.7–7.7)
Neutrophils Relative %: 35 %
Platelet Count: 351 10*3/uL (ref 150–400)
RBC: 3.62 MIL/uL — ABNORMAL LOW (ref 3.87–5.11)
RDW: 13.4 % (ref 11.5–15.5)
WBC Count: 6.6 10*3/uL (ref 4.0–10.5)
nRBC: 0 % (ref 0.0–0.2)

## 2019-07-20 NOTE — Addendum Note (Signed)
Addended by: Amelia Jo I on: 07/20/2019 10:02 AM   Modules accepted: Orders

## 2019-07-20 NOTE — Progress Notes (Signed)
Hematology and Oncology Follow Up Visit  Kathleen Pace:[id number] 1941/05/07 78 y.o. 07/20/2019   Principle Diagnosis:  Chronic immune thrombocytopenia, remission. 2. Pernicious anemia. 3. Vitiligo. 4. Rheumatoid Arthritis  Current Therapy:    Vitamin B12 1 mg IM Q. month      Interim History:  Ms.  Pace is back for follow-up.  She is doing better.  Her weight is up 6 pounds since we last saw her.  She is not taking anything for her appetite.  She has had no problems with cough.  There is no productive cough.  She has had no nausea or vomiting.  She has had no bleeding.  There is been no leg swelling.  We did do a PET scan on her more we last saw her, there is some indeterminate activity in her lungs.  The radiologist recommended a follow-up scan.  We will have to do 1 in early November.  She and her husband are going camping after she sees [place] today.  I am so happy that she is doing this.  She is looking forward to this.  She does get her B12 injection.  She has had no issues with headache.  Overall, her performance status is ECOG 1.    Medications:  Current Outpatient Medications:  .  acetaminophen (TYLENOL) 325 MG tablet, Take 650 mg by mouth every 6 (six) hours as needed.  , Disp: , Rfl:  .  albuterol (PROVENTIL HFA;VENTOLIN HFA) 108 (90 Base) MCG/ACT inhaler, Inhale into the lungs., Disp: , Rfl:  .  alendronate (FOSAMAX) 70 MG tablet, Take 1 tablet po weekly.  Stay upright for 1 hour following, Disp: , Rfl:  .  CALCIUM-VITAMIN D PO, Take 1 capsule by mouth every morning. Calcium 600 mg  Vitamin D 200, Disp: , Rfl:  .  Cholecalciferol (VITAMIN D) 2000 UNITS tablet, Take 2,000 Units by mouth daily., Disp: , Rfl:  .  cyanocobalamin (,VITAMIN B-12,) 1000 MCG/ML injection, INJECT 1 ML ONCE MONTHLY, Disp: 3 mL, Rfl: 24 .  cycloSPORINE (RESTASIS) 0.05 % ophthalmic emulsion, 1 drop 2 times daily., Disp: , Rfl:  .  esomeprazole (NEXIUM) 20 MG capsule, Take 20 mg by mouth  daily at 12 noon., Disp: , Rfl:  .  hydrocortisone 2.5 % cream, Apply to affected area three times a day as needed., Disp: , Rfl:  .  hydroxychloroquine (PLAQUENIL) 200 MG tablet, 200 mg daily. , Disp: , Rfl:  .  Krill Oil 500 MG CAPS, Take by mouth every morning., Disp: , Rfl:  .  leflunomide (ARAVA) 10 MG tablet, Take 10 mg by mouth daily. , Disp: , Rfl:  .  levothyroxine (SYNTHROID, LEVOTHROID) 125 MCG tablet, 05/16/2019 Takes 1.5 tab 3 times weekly, 1 tablet the other 4 days., Disp: , Rfl:  .  megestrol (MEGACE) 400 MG/10ML suspension, Take 10 mLs (400 mg total) by mouth 2 (two) times daily., Disp: 480 mL, Rfl: 6 .  Misc Natural Products (TUMERSAID) TABS, Take by mouth., Disp: , Rfl:  .  Omega-3 Fatty Acids (OMEGA-3 FISH OIL PO), Take by mouth., Disp: , Rfl:  .  omeprazole (PRILOSEC) 20 MG capsule, Take 20 mg by mouth as needed., Disp: , Rfl:  .  sertraline (ZOLOFT) 50 MG tablet, Take 50 mg by mouth daily.  , Disp: , Rfl:  .  umeclidinium-vilanterol (ANORO ELLIPTA) 62.5-25 MCG/INH AEPB, Inhale into the lungs daily. , Disp: , Rfl:  .  valsartan (DIOVAN) 320 MG tablet, Take by mouth daily. ,  Disp: , Rfl:   Allergies:  Allergies  Allergen Reactions  . Iodinated Diagnostic Agents Swelling  . Sulfa Antibiotics Other (See Comments)  . Penicillins   . Quinine Derivatives   . Doxycycline Nausea Only    Past Medical History, Surgical history, Social history, and Family History were reviewed and updated.  Review of Systems: Review of Systems  Constitutional: Positive for malaise/fatigue.  HENT: Positive for congestion.   Eyes: Negative.   Respiratory: Positive for cough.   Cardiovascular: Positive for palpitations.  Gastrointestinal: Positive for diarrhea and nausea.  Genitourinary: Negative.   Musculoskeletal: Positive for joint pain and myalgias.  Skin: Negative.   Neurological: Negative.   Endo/Heme/Allergies: Negative.   Psychiatric/Behavioral: Negative.      Physical Exam:   vitals were not taken for this visit.   Physical Exam Vitals signs reviewed.  HENT:     Head: Normocephalic and atraumatic.  Eyes:     Pupils: Pupils are equal, round, and reactive to light.  Neck:     Musculoskeletal: Normal range of motion.  Cardiovascular:     Rate and Rhythm: Normal rate and regular rhythm.     Heart sounds: Normal heart sounds.  Pulmonary:     Effort: Pulmonary effort is normal.     Breath sounds: Normal breath sounds.  Abdominal:     General: Bowel sounds are normal.     Palpations: Abdomen is soft.  Musculoskeletal: Normal range of motion.        General: No tenderness or deformity.  Lymphadenopathy:     Cervical: No cervical adenopathy.  Skin:    General: Skin is warm and dry.     Findings: No erythema or rash.  Neurological:     Mental Status: She is alert and oriented to person, place, and time.  Psychiatric:        Behavior: Behavior normal.        Thought Content: Thought content normal.        Judgment: Judgment normal.      Lab Results  Component Value Date   WBC 6.6 07/20/2019   HGB 12.3 07/20/2019   HCT 37.3 07/20/2019   MCV 103.0 (H) 07/20/2019   PLT 351 07/20/2019     Chemistry      Component Value Date/Time   NA 139 07/20/2019 0900   NA 144 09/11/2017 1335   NA 138 07/31/2016 0947   K 3.8 07/20/2019 0900   K 4.2 09/11/2017 1335   K 4.3 07/31/2016 0947   CL 101 07/20/2019 0900   CL 104 09/11/2017 1335   CO2 31 07/20/2019 0900   CO2 28 09/11/2017 1335   CO2 27 07/31/2016 0947   BUN 12 07/20/2019 0900   BUN 17 09/11/2017 1335   BUN 14.7 07/31/2016 0947   CREATININE 0.49 07/20/2019 0900   CREATININE 0.9 09/11/2017 1335   CREATININE 0.7 07/31/2016 0947      Component Value Date/Time   CALCIUM 9.3 07/20/2019 0900   CALCIUM 9.0 09/11/2017 1335   CALCIUM 9.2 07/31/2016 0947   ALKPHOS 62 07/20/2019 0900   ALKPHOS 102 (H) 09/11/2017 1335   ALKPHOS 70 07/31/2016 0947   AST 17 07/20/2019 0900   AST 18 07/31/2016 0947    ALT 9 07/20/2019 0900   ALT 20 09/11/2017 1335   ALT 9 07/31/2016 0947   BILITOT 0.5 07/20/2019 0900   BILITOT 0.62 07/31/2016 0947         Impression and Plan: Kathleen Pace is 78 year old white female.  She has a clear autoimmune issue. She has pernicious anemia. She has vitiligo. She has hypo-thyroidism.  We will see what the PET scan shows.  Again this will be incredibly important.  I would like to see her back in November.  I want to get her back before the holidays.  We will make sure she gets her B12 injection when we see her back.      Volanda Napoleon, MD 9/30/20209:47 AM

## 2019-07-20 NOTE — Telephone Encounter (Signed)
Called and LMVM for patient with updated appointments per 9/30 los

## 2019-07-21 ENCOUNTER — Telehealth: Payer: Self-pay | Admitting: Hematology & Oncology

## 2019-07-21 LAB — KAPPA/LAMBDA LIGHT CHAINS
Kappa free light chain: 34.7 mg/L — ABNORMAL HIGH (ref 3.3–19.4)
Kappa, lambda light chain ratio: 1.37 (ref 0.26–1.65)
Lambda free light chains: 25.4 mg/L (ref 5.7–26.3)

## 2019-07-21 LAB — IGG, IGA, IGM
IgA: 368 mg/dL (ref 64–422)
IgG (Immunoglobin G), Serum: 876 mg/dL (ref 586–1602)
IgM (Immunoglobulin M), Srm: 143 mg/dL (ref 26–217)

## 2019-07-21 NOTE — Telephone Encounter (Signed)
Called and LMVM for patient regarding appointment for PET Scan appt w/ directions given and number to call if she needed to reschedule

## 2019-08-25 ENCOUNTER — Ambulatory Visit (HOSPITAL_COMMUNITY): Payer: PPO

## 2019-08-29 ENCOUNTER — Ambulatory Visit (HOSPITAL_COMMUNITY)
Admission: RE | Admit: 2019-08-29 | Discharge: 2019-08-29 | Disposition: A | Discharge status: Home / Self Care | Payer: PPO | Source: Ambulatory Visit | Attending: Hematology & Oncology | Admitting: Hematology & Oncology

## 2019-08-29 ENCOUNTER — Other Ambulatory Visit: Payer: Self-pay

## 2019-08-29 DIAGNOSIS — R911 Solitary pulmonary nodule: Secondary | ICD-10-CM | POA: Diagnosis not present

## 2019-08-29 DIAGNOSIS — Z79899 Other long term (current) drug therapy: Secondary | ICD-10-CM | POA: Insufficient documentation

## 2019-08-29 DIAGNOSIS — R918 Other nonspecific abnormal finding of lung field: Secondary | ICD-10-CM | POA: Diagnosis not present

## 2019-08-29 LAB — GLUCOSE, CAPILLARY: Glucose-Capillary: 81 mg/dL (ref 70–99)

## 2019-08-29 MED ORDER — FLUDEOXYGLUCOSE F - 18 (FDG) INJECTION
5.6800 | Freq: Once | INTRAVENOUS | Status: AC | PRN
  Administered 2019-08-29: 11:00:00 5.68 via INTRAVENOUS

## 2019-08-30 DIAGNOSIS — T8484XA Pain due to internal orthopedic prosthetic devices, implants and grafts, initial encounter: Secondary | ICD-10-CM | POA: Diagnosis not present

## 2019-08-31 ENCOUNTER — Encounter: Payer: Self-pay | Admitting: Hematology & Oncology

## 2019-08-31 ENCOUNTER — Other Ambulatory Visit: Payer: Self-pay

## 2019-08-31 ENCOUNTER — Inpatient Hospital Stay (HOSPITAL_BASED_OUTPATIENT_CLINIC_OR_DEPARTMENT_OTHER): Payer: PPO | Admitting: Hematology & Oncology

## 2019-08-31 ENCOUNTER — Inpatient Hospital Stay: Payer: PPO

## 2019-08-31 ENCOUNTER — Inpatient Hospital Stay: Payer: PPO | Attending: Hematology & Oncology

## 2019-08-31 DIAGNOSIS — L80 Vitiligo: Secondary | ICD-10-CM | POA: Diagnosis not present

## 2019-08-31 DIAGNOSIS — E039 Hypothyroidism, unspecified: Secondary | ICD-10-CM | POA: Insufficient documentation

## 2019-08-31 DIAGNOSIS — D501 Sideropenic dysphagia: Secondary | ICD-10-CM

## 2019-08-31 DIAGNOSIS — D510 Vitamin B12 deficiency anemia due to intrinsic factor deficiency: Secondary | ICD-10-CM | POA: Diagnosis not present

## 2019-08-31 DIAGNOSIS — M069 Rheumatoid arthritis, unspecified: Secondary | ICD-10-CM | POA: Insufficient documentation

## 2019-08-31 DIAGNOSIS — H16143 Punctate keratitis, bilateral: Secondary | ICD-10-CM | POA: Diagnosis not present

## 2019-08-31 DIAGNOSIS — Z79899 Other long term (current) drug therapy: Secondary | ICD-10-CM | POA: Diagnosis not present

## 2019-08-31 DIAGNOSIS — J849 Interstitial pulmonary disease, unspecified: Secondary | ICD-10-CM

## 2019-08-31 DIAGNOSIS — D693 Immune thrombocytopenic purpura: Secondary | ICD-10-CM | POA: Diagnosis not present

## 2019-08-31 DIAGNOSIS — H04123 Dry eye syndrome of bilateral lacrimal glands: Secondary | ICD-10-CM | POA: Diagnosis not present

## 2019-08-31 LAB — CMP (CANCER CENTER ONLY)
ALT: 12 U/L (ref 0–44)
AST: 20 U/L (ref 15–41)
Albumin: 4.1 g/dL (ref 3.5–5.0)
Alkaline Phosphatase: 68 U/L (ref 38–126)
Anion gap: 7 (ref 5–15)
BUN: 13 mg/dL (ref 8–23)
CO2: 30 mmol/L (ref 22–32)
Calcium: 9.5 mg/dL (ref 8.9–10.3)
Chloride: 102 mmol/L (ref 98–111)
Creatinine: 0.56 mg/dL (ref 0.44–1.00)
GFR, Est AFR Am: >60 mL/min (ref >60)
GFR, Estimated: >60 mL/min (ref >60)
Glucose, Bld: 115 mg/dL — ABNORMAL HIGH (ref 70–99)
Potassium: 4 mmol/L (ref 3.5–5.1)
Sodium: 139 mmol/L (ref 135–145)
Total Bilirubin: 0.4 mg/dL (ref 0.3–1.2)
Total Protein: 6.7 g/dL (ref 6.5–8.1)

## 2019-08-31 LAB — CBC WITH DIFFERENTIAL (CANCER CENTER ONLY)
Abs Immature Granulocytes: 0.02 10*3/uL (ref 0.00–0.07)
Basophils Absolute: 0.1 10*3/uL (ref 0.0–0.1)
Basophils Relative: 1 %
Eosinophils Absolute: 0.1 10*3/uL (ref 0.0–0.5)
Eosinophils Relative: 1 %
HCT: 38.7 % (ref 36.0–46.0)
Hemoglobin: 12.7 g/dL (ref 12.0–15.0)
Immature Granulocytes: 0 %
Lymphocytes Relative: 38 %
Lymphs Abs: 3.3 10*3/uL (ref 0.7–4.0)
MCH: 34.2 pg — ABNORMAL HIGH (ref 26.0–34.0)
MCHC: 32.8 g/dL (ref 30.0–36.0)
MCV: 104.3 fL — ABNORMAL HIGH (ref 80.0–100.0)
Monocytes Absolute: 1.1 10*3/uL — ABNORMAL HIGH (ref 0.1–1.0)
Monocytes Relative: 13 %
Neutro Abs: 4.1 10*3/uL (ref 1.7–7.7)
Neutrophils Relative %: 47 %
Platelet Count: 363 10*3/uL (ref 150–400)
RBC: 3.71 MIL/uL — ABNORMAL LOW (ref 3.87–5.11)
RDW: 12.2 % (ref 11.5–15.5)
WBC Count: 8.6 10*3/uL (ref 4.0–10.5)
nRBC: 0 % (ref 0.0–0.2)

## 2019-08-31 LAB — VITAMIN B12: Vitamin B-12: 403 pg/mL (ref 180–914)

## 2019-08-31 NOTE — Progress Notes (Signed)
Hematology and Oncology Follow Up Visit  Kathleen Pace KI:[id number] 1941/09/10 78 y.o. 08/31/2019   Principle Diagnosis:  Chronic immune thrombocytopenia, remission. 2. Pernicious anemia. 3. Vitiligo. 4. Rheumatoid Arthritis  Current Therapy:    Vitamin B12 1 mg IM Q. month      Interim History:  Ms.  Pace is back for follow-up.  She is doing okay.  She really has had no specific complaints since we last saw her.  She did have a PET scan done.  This is to try to see if there is any change with these pulmonary infiltrates.  The PET scan was done on 08/29/2019.  Thankfully, the PET scan did not show anything that looked suspicious for malignancy.  She still had this activity and consolidation of the right lung.  There was moderate metabolic activity.  The radiologist felt that follow-up CT scan would be indicated in about 6 months.  She has had no issues with fever.  She is doing her vitamin B12 at home.  She has had no bleeding.  Is been no change in bowel or bladder habits..  She did have ankle surgery on the left ankle next Thursday.    Overall, her performance status is ECOG 1.    Medications:  Current Outpatient Medications:  .  acetaminophen (TYLENOL) 325 MG tablet, Take 650 mg by mouth every 6 (six) hours as needed.  , Disp: , Rfl:  .  albuterol (PROVENTIL HFA;VENTOLIN HFA) 108 (90 Base) MCG/ACT inhaler, Inhale into the lungs., Disp: , Rfl:  .  alendronate (FOSAMAX) 70 MG tablet, Take 1 tablet po weekly.  Stay upright for 1 hour following, Disp: , Rfl:  .  CALCIUM-VITAMIN D PO, Take 1 capsule by mouth every morning. Calcium 600 mg  Vitamin D 200, Disp: , Rfl:  .  Cholecalciferol (VITAMIN D) 2000 UNITS tablet, Take 2,000 Units by mouth daily., Disp: , Rfl:  .  cyanocobalamin (,VITAMIN B-12,) 1000 MCG/ML injection, INJECT 1 ML ONCE MONTHLY, Disp: 3 mL, Rfl: 24 .  esomeprazole (NEXIUM) 20 MG capsule, Take 20 mg by mouth daily at 12 noon., Disp: , Rfl:  .   hydroxychloroquine (PLAQUENIL) 200 MG tablet, 200 mg daily. , Disp: , Rfl:  .  leflunomide (ARAVA) 10 MG tablet, Take 10 mg by mouth daily. , Disp: , Rfl:  .  levothyroxine (SYNTHROID, LEVOTHROID) 125 MCG tablet, 125 mcg daily. 08/31/2019 One day each week, will increase to 1.5 tabs., Disp: , Rfl:  .  Omega-3 Fatty Acids (OMEGA-3 FISH OIL PO), Take by mouth., Disp: , Rfl:  .  prednisoLONE acetate (PRED FORTE) 1 % ophthalmic suspension, Place 1 drop into both eyes 3 (three) times daily. , Disp: , Rfl:  .  sertraline (ZOLOFT) 50 MG tablet, Take 50 mg by mouth daily.  , Disp: , Rfl:  .  umeclidinium-vilanterol (ANORO ELLIPTA) 62.5-25 MCG/INH AEPB, Inhale into the lungs daily. , Disp: , Rfl:  .  valsartan (DIOVAN) 320 MG tablet, Take by mouth daily. , Disp: , Rfl:  .  XIIDRA 5 % SOLN, Apply 1 drop into both eyes twice a day, Disp: , Rfl:  .  cephALEXin (KEFLEX) 500 MG capsule, Take 500 mg by mouth 4 (four) times daily., Disp: , Rfl:  .  [START ON 09/08/2019] HYDROcodone-acetaminophen (NORCO/VICODIN) 5-325 MG tablet, Take by mouth., Disp: , Rfl:   Allergies:  Allergies  Allergen Reactions  . Iodinated Diagnostic Agents Swelling  . Sulfa Antibiotics Other (See Comments)    Drops Blood  Pressure  . Doxycycline Nausea Only  . Penicillins Rash  . Quinine Derivatives Other (See Comments)    Patient doesn't remember reaction    Past Medical History, Surgical history, Social history, and Family History were reviewed and updated.  Review of Systems: Review of Systems  Constitutional: Positive for malaise/fatigue.  HENT: Positive for congestion.   Eyes: Negative.   Respiratory: Positive for cough.   Cardiovascular: Positive for palpitations.  Gastrointestinal: Positive for diarrhea and nausea.  Genitourinary: Negative.   Musculoskeletal: Positive for joint pain and myalgias.  Skin: Negative.   Neurological: Negative.   Endo/Heme/Allergies: Negative.   Psychiatric/Behavioral: Negative.       Physical Exam:  weight is 112 lb 12.8 oz (51.2 kg). Her temporal temperature is 98.2 F (36.8 C). Her blood pressure is 205/85 (abnormal) and her pulse is 79. Her respiration is 20 and oxygen saturation is 97%.   Physical Exam Vitals signs reviewed.  HENT:     Head: Normocephalic and atraumatic.  Eyes:     Pupils: Pupils are equal, round, and reactive to light.  Neck:     Musculoskeletal: Normal range of motion.  Cardiovascular:     Rate and Rhythm: Normal rate and regular rhythm.     Heart sounds: Normal heart sounds.  Pulmonary:     Effort: Pulmonary effort is normal.     Breath sounds: Normal breath sounds.  Abdominal:     General: Bowel sounds are normal.     Palpations: Abdomen is soft.  Musculoskeletal: Normal range of motion.        General: No tenderness or deformity.  Lymphadenopathy:     Cervical: No cervical adenopathy.  Skin:    General: Skin is warm and dry.     Findings: No erythema or rash.  Neurological:     Mental Status: She is alert and oriented to person, place, and time.  Psychiatric:        Behavior: Behavior normal.        Thought Content: Thought content normal.        Judgment: Judgment normal.      Lab Results  Component Value Date   WBC 8.6 08/31/2019   HGB 12.7 08/31/2019   HCT 38.7 08/31/2019   MCV 104.3 (H) 08/31/2019   PLT 363 08/31/2019     Chemistry      Component Value Date/Time   NA 139 08/31/2019 1420   NA 144 09/11/2017 1335   NA 138 07/31/2016 0947   K 4.0 08/31/2019 1420   K 4.2 09/11/2017 1335   K 4.3 07/31/2016 0947   CL 102 08/31/2019 1420   CL 104 09/11/2017 1335   CO2 30 08/31/2019 1420   CO2 28 09/11/2017 1335   CO2 27 07/31/2016 0947   BUN 13 08/31/2019 1420   BUN 17 09/11/2017 1335   BUN 14.7 07/31/2016 0947   CREATININE 0.56 08/31/2019 1420   CREATININE 0.9 09/11/2017 1335   CREATININE 0.7 07/31/2016 0947      Component Value Date/Time   CALCIUM 9.5 08/31/2019 1420   CALCIUM 9.0 09/11/2017  1335   CALCIUM 9.2 07/31/2016 0947   ALKPHOS 68 08/31/2019 1420   ALKPHOS 102 (H) 09/11/2017 1335   ALKPHOS 70 07/31/2016 0947   AST 20 08/31/2019 1420   AST 18 07/31/2016 0947   ALT 12 08/31/2019 1420   ALT 20 09/11/2017 1335   ALT 9 07/31/2016 0947   BILITOT 0.4 08/31/2019 1420   BILITOT 0.62 07/31/2016 0947  Impression and Plan: Ms. Kalbach is 78 year old white female. She has a clear autoimmune issue. She has pernicious anemia. She has vitiligo. She has hypo-thyroidism.  I will repeat the CT scan on her in 4 months.  I think this would be reasonable.  Again, I do not think there is anything that is malignant that we have to watch out for.  She will do her vitamin B12 at home.  I do not see any problems with her having surgery for the ankle.  Her blood counts look great.  Volanda Napoleon, MD 11/11/20203:31 PM

## 2019-09-01 ENCOUNTER — Telehealth: Payer: Self-pay | Admitting: *Deleted

## 2019-09-01 LAB — IRON AND TIBC
Iron: 57 ug/dL (ref 41–142)
Saturation Ratios: 20 % — ABNORMAL LOW (ref 21–57)
TIBC: 288 ug/dL (ref 236–444)
UIBC: 231 ug/dL (ref 120–384)

## 2019-09-01 LAB — FERRITIN: Ferritin: 54 ng/mL (ref 11–307)

## 2019-09-01 NOTE — Telephone Encounter (Signed)
the iron is low per Dr Marin Olp.  Needs 1 dose of IV iron ASAP. Called patient with this information. She is having ankle surgery on Thursday of next week. Appt made for Tuesday of next week for iron

## 2019-09-02 DIAGNOSIS — Z20828 Contact with and (suspected) exposure to other viral communicable diseases: Secondary | ICD-10-CM | POA: Diagnosis not present

## 2019-09-02 DIAGNOSIS — S91002A Unspecified open wound, left ankle, initial encounter: Secondary | ICD-10-CM | POA: Diagnosis not present

## 2019-09-02 DIAGNOSIS — T8484XA Pain due to internal orthopedic prosthetic devices, implants and grafts, initial encounter: Secondary | ICD-10-CM | POA: Diagnosis not present

## 2019-09-02 DIAGNOSIS — Z01812 Encounter for preprocedural laboratory examination: Secondary | ICD-10-CM | POA: Diagnosis not present

## 2019-09-06 ENCOUNTER — Other Ambulatory Visit: Payer: Self-pay

## 2019-09-06 ENCOUNTER — Inpatient Hospital Stay: Payer: PPO

## 2019-09-06 VITALS — BP 167/68 | HR 83 | Temp 98.0°F | Resp 18

## 2019-09-06 DIAGNOSIS — D500 Iron deficiency anemia secondary to blood loss (chronic): Secondary | ICD-10-CM

## 2019-09-06 DIAGNOSIS — D693 Immune thrombocytopenic purpura: Secondary | ICD-10-CM | POA: Diagnosis not present

## 2019-09-06 MED ORDER — SODIUM CHLORIDE 0.9 % IV SOLN
510.0000 mg | Freq: Once | INTRAVENOUS | Status: AC
  Administered 2019-09-06: 15:00:00 510 mg via INTRAVENOUS
  Filled 2019-09-06: qty 17

## 2019-09-06 MED ORDER — SODIUM CHLORIDE 0.9 % IV SOLN
Freq: Once | INTRAVENOUS | Status: AC
  Administered 2019-09-06: 14:00:00 via INTRAVENOUS
  Filled 2019-09-06: qty 250

## 2019-09-08 DIAGNOSIS — S91001A Unspecified open wound, right ankle, initial encounter: Secondary | ICD-10-CM | POA: Diagnosis not present

## 2019-09-08 DIAGNOSIS — T8484XA Pain due to internal orthopedic prosthetic devices, implants and grafts, initial encounter: Secondary | ICD-10-CM | POA: Diagnosis not present

## 2019-09-08 DIAGNOSIS — Z472 Encounter for removal of internal fixation device: Secondary | ICD-10-CM | POA: Diagnosis not present

## 2019-09-29 DIAGNOSIS — Z6820 Body mass index (BMI) 20.0-20.9, adult: Secondary | ICD-10-CM | POA: Diagnosis not present

## 2019-09-29 DIAGNOSIS — Z79899 Other long term (current) drug therapy: Secondary | ICD-10-CM | POA: Diagnosis not present

## 2019-09-29 DIAGNOSIS — M545 Low back pain: Secondary | ICD-10-CM | POA: Diagnosis not present

## 2019-09-29 DIAGNOSIS — R634 Abnormal weight loss: Secondary | ICD-10-CM | POA: Diagnosis not present

## 2019-09-29 DIAGNOSIS — M3500 Sicca syndrome, unspecified: Secondary | ICD-10-CM | POA: Diagnosis not present

## 2019-09-29 DIAGNOSIS — M7989 Other specified soft tissue disorders: Secondary | ICD-10-CM | POA: Diagnosis not present

## 2019-09-29 DIAGNOSIS — M2550 Pain in unspecified joint: Secondary | ICD-10-CM | POA: Diagnosis not present

## 2019-09-29 DIAGNOSIS — H539 Unspecified visual disturbance: Secondary | ICD-10-CM | POA: Diagnosis not present

## 2019-09-29 DIAGNOSIS — M0589 Other rheumatoid arthritis with rheumatoid factor of multiple sites: Secondary | ICD-10-CM | POA: Diagnosis not present

## 2019-09-30 DIAGNOSIS — H04123 Dry eye syndrome of bilateral lacrimal glands: Secondary | ICD-10-CM | POA: Diagnosis not present

## 2019-09-30 DIAGNOSIS — H16143 Punctate keratitis, bilateral: Secondary | ICD-10-CM | POA: Diagnosis not present

## 2019-10-11 DIAGNOSIS — E039 Hypothyroidism, unspecified: Secondary | ICD-10-CM | POA: Diagnosis not present

## 2019-10-11 DIAGNOSIS — I10 Essential (primary) hypertension: Secondary | ICD-10-CM | POA: Diagnosis not present

## 2019-10-11 DIAGNOSIS — F411 Generalized anxiety disorder: Secondary | ICD-10-CM | POA: Diagnosis not present

## 2019-10-11 DIAGNOSIS — M810 Age-related osteoporosis without current pathological fracture: Secondary | ICD-10-CM | POA: Diagnosis not present

## 2019-10-12 DIAGNOSIS — J449 Chronic obstructive pulmonary disease, unspecified: Secondary | ICD-10-CM | POA: Diagnosis not present

## 2019-10-12 DIAGNOSIS — J181 Lobar pneumonia, unspecified organism: Secondary | ICD-10-CM | POA: Diagnosis not present

## 2019-12-27 ENCOUNTER — Other Ambulatory Visit: Payer: Self-pay | Admitting: Hematology & Oncology

## 2019-12-27 DIAGNOSIS — D510 Vitamin B12 deficiency anemia due to intrinsic factor deficiency: Secondary | ICD-10-CM

## 2019-12-28 ENCOUNTER — Other Ambulatory Visit: Payer: Self-pay

## 2019-12-28 DIAGNOSIS — D500 Iron deficiency anemia secondary to blood loss (chronic): Secondary | ICD-10-CM

## 2019-12-29 ENCOUNTER — Inpatient Hospital Stay (HOSPITAL_BASED_OUTPATIENT_CLINIC_OR_DEPARTMENT_OTHER): Payer: Medicare Other | Admitting: Hematology & Oncology

## 2019-12-29 ENCOUNTER — Other Ambulatory Visit: Payer: Self-pay

## 2019-12-29 ENCOUNTER — Ambulatory Visit (HOSPITAL_BASED_OUTPATIENT_CLINIC_OR_DEPARTMENT_OTHER)
Admission: RE | Admit: 2019-12-29 | Discharge: 2019-12-29 | Disposition: A | Discharge status: Home / Self Care | Payer: Medicare Other | Source: Ambulatory Visit | Attending: Hematology & Oncology | Admitting: Hematology & Oncology

## 2019-12-29 ENCOUNTER — Inpatient Hospital Stay: Payer: Medicare Other | Attending: Hematology & Oncology

## 2019-12-29 ENCOUNTER — Encounter: Payer: Self-pay | Admitting: Hematology & Oncology

## 2019-12-29 VITALS — BP 124/78 | HR 94 | Temp 97.8°F | Resp 19 | Wt 111.0 lb

## 2019-12-29 DIAGNOSIS — E039 Hypothyroidism, unspecified: Secondary | ICD-10-CM | POA: Diagnosis not present

## 2019-12-29 DIAGNOSIS — R918 Other nonspecific abnormal finding of lung field: Secondary | ICD-10-CM | POA: Insufficient documentation

## 2019-12-29 DIAGNOSIS — L80 Vitiligo: Secondary | ICD-10-CM | POA: Insufficient documentation

## 2019-12-29 DIAGNOSIS — M069 Rheumatoid arthritis, unspecified: Secondary | ICD-10-CM | POA: Diagnosis not present

## 2019-12-29 DIAGNOSIS — D500 Iron deficiency anemia secondary to blood loss (chronic): Secondary | ICD-10-CM

## 2019-12-29 DIAGNOSIS — J849 Interstitial pulmonary disease, unspecified: Secondary | ICD-10-CM | POA: Diagnosis present

## 2019-12-29 DIAGNOSIS — D510 Vitamin B12 deficiency anemia due to intrinsic factor deficiency: Secondary | ICD-10-CM | POA: Insufficient documentation

## 2019-12-29 DIAGNOSIS — D693 Immune thrombocytopenic purpura: Secondary | ICD-10-CM | POA: Diagnosis present

## 2019-12-29 DIAGNOSIS — Z79899 Other long term (current) drug therapy: Secondary | ICD-10-CM | POA: Insufficient documentation

## 2019-12-29 LAB — CMP (CANCER CENTER ONLY)
ALT: 8 U/L (ref 0–44)
AST: 18 U/L (ref 15–41)
Albumin: 3.7 g/dL (ref 3.5–5.0)
Alkaline Phosphatase: 70 U/L (ref 38–126)
Anion gap: 8 (ref 5–15)
BUN: 16 mg/dL (ref 8–23)
CO2: 30 mmol/L (ref 22–32)
Calcium: 9.9 mg/dL (ref 8.9–10.3)
Chloride: 101 mmol/L (ref 98–111)
Creatinine: 0.6 mg/dL (ref 0.44–1.00)
GFR, Est AFR Am: >60 mL/min (ref >60)
GFR, Estimated: >60 mL/min (ref >60)
Glucose, Bld: 109 mg/dL — ABNORMAL HIGH (ref 70–99)
Potassium: 5.2 mmol/L — ABNORMAL HIGH (ref 3.5–5.1)
Sodium: 139 mmol/L (ref 135–145)
Total Bilirubin: 0.7 mg/dL (ref 0.3–1.2)
Total Protein: 6.5 g/dL (ref 6.5–8.1)

## 2019-12-29 LAB — CBC WITH DIFFERENTIAL (CANCER CENTER ONLY)
Abs Immature Granulocytes: 0.02 10*3/uL (ref 0.00–0.07)
Basophils Absolute: 0.1 10*3/uL (ref 0.0–0.1)
Basophils Relative: 1 %
Eosinophils Absolute: 0.1 10*3/uL (ref 0.0–0.5)
Eosinophils Relative: 1 %
HCT: 35.1 % — ABNORMAL LOW (ref 36.0–46.0)
Hemoglobin: 11.3 g/dL — ABNORMAL LOW (ref 12.0–15.0)
Immature Granulocytes: 0 %
Lymphocytes Relative: 37 %
Lymphs Abs: 2.2 10*3/uL (ref 0.7–4.0)
MCH: 33.7 pg (ref 26.0–34.0)
MCHC: 32.2 g/dL (ref 30.0–36.0)
MCV: 104.8 fL — ABNORMAL HIGH (ref 80.0–100.0)
Monocytes Absolute: 0.9 10*3/uL (ref 0.1–1.0)
Monocytes Relative: 16 %
Neutro Abs: 2.6 10*3/uL (ref 1.7–7.7)
Neutrophils Relative %: 45 %
Platelet Count: 520 10*3/uL — ABNORMAL HIGH (ref 150–400)
RBC: 3.35 MIL/uL — ABNORMAL LOW (ref 3.87–5.11)
RDW: 18 % — ABNORMAL HIGH (ref 11.5–15.5)
WBC Count: 5.9 10*3/uL (ref 4.0–10.5)
nRBC: 0 % (ref 0.0–0.2)

## 2019-12-29 LAB — VITAMIN B12: Vitamin B-12: 486 pg/mL (ref 180–914)

## 2019-12-29 NOTE — Progress Notes (Signed)
Hematology and Oncology Follow Up Visit  Kathleen Pace:[id number] 08-21-1941 79 y.o. 12/29/2019   Principle Diagnosis:  Chronic immune thrombocytopenia, remission. 2. Pernicious anemia. 3. Vitiligo. 4. Rheumatoid Arthritis  Current Therapy:    Vitamin B12 1 mg IM Q. month      Interim History:  Ms.  Pace is back for follow-up.  Shockingly enough, she really has had issues since we last saw her.  She had the coronavirus back in January.  Thankfully, she was not admitted to the hospital.  She got over this.  She then fell and broke her left hip 3 weeks ago.  She had surgery at [facility].  She got through this okay.  She had a CT scan done today.  The CT scan showed marked improvement in the pulmonary infiltrates that she had had before.  Her platelet count is quite high right now.  This troubles me a little bit.  This might be from her surgery.  She may have little bit of iron deficiency.    Her appetite is okay.  Her weight is about the same since we last saw her in November 2020.  She has had no obvious bleeding.  She had no complications from the surgery.  Currently, her performance status is ECOG 2.  Medications:  Current Outpatient Medications:  .  acetaminophen (TYLENOL) 325 MG tablet, Take 650 mg by mouth every 6 (six) hours as needed.  , Disp: , Rfl:  .  albuterol (PROVENTIL HFA;VENTOLIN HFA) 108 (90 Base) MCG/ACT inhaler, Inhale into the lungs., Disp: , Rfl:  .  alendronate (FOSAMAX) 70 MG tablet, Take 1 tablet po weekly.  Stay upright for 1 hour following, Disp: , Rfl:  .  CALCIUM-VITAMIN D PO, Take 1 capsule by mouth every morning. Calcium 600 mg  Vitamin D 200, Disp: , Rfl:  .  cephALEXin (KEFLEX) 500 MG capsule, Take 500 mg by mouth 4 (four) times daily., Disp: , Rfl:  .  Cholecalciferol (VITAMIN D) 2000 UNITS tablet, Take 2,000 Units by mouth daily., Disp: , Rfl:  .  cyanocobalamin (,VITAMIN B-12,) 1000 MCG/ML injection, INJECT 1 ML ONCE MONTHLY, Disp:  3 mL, Rfl: 24 .  esomeprazole (NEXIUM) 20 MG capsule, Take 20 mg by mouth daily at 12 noon., Disp: , Rfl:  .  hydroxychloroquine (PLAQUENIL) 200 MG tablet, 200 mg daily. , Disp: , Rfl:  .  leflunomide (ARAVA) 10 MG tablet, Take 10 mg by mouth daily. , Disp: , Rfl:  .  levothyroxine (SYNTHROID, LEVOTHROID) 125 MCG tablet, 125 mcg daily. 08/31/2019 One day each week, will increase to 1.5 tabs., Disp: , Rfl:  .  Omega-3 Fatty Acids (OMEGA-3 FISH OIL PO), Take by mouth., Disp: , Rfl:  .  prednisoLONE acetate (PRED FORTE) 1 % ophthalmic suspension, Place 1 drop into both eyes 3 (three) times daily. , Disp: , Rfl:  .  sertraline (ZOLOFT) 50 MG tablet, Take 50 mg by mouth daily.  , Disp: , Rfl:  .  umeclidinium-vilanterol (ANORO ELLIPTA) 62.5-25 MCG/INH AEPB, Inhale into the lungs daily. , Disp: , Rfl:  .  valsartan (DIOVAN) 320 MG tablet, Take by mouth daily. , Disp: , Rfl:  .  XIIDRA 5 % SOLN, Apply 1 drop into both eyes twice a day, Disp: , Rfl:   Allergies:  Allergies  Allergen Reactions  . Iodinated Diagnostic Agents Swelling  . Sulfa Antibiotics Other (See Comments)    Drops Blood Pressure  . Doxycycline Nausea Only  . Penicillins Rash  .  Quinine Derivatives Other (See Comments)    Patient doesn't remember reaction    Past Medical History, Surgical history, Social history, and Family History were reviewed and updated.  Review of Systems: Review of Systems  Constitutional: Positive for malaise/fatigue.  HENT: Positive for congestion.   Eyes: Negative.   Respiratory: Positive for cough.   Cardiovascular: Positive for palpitations.  Gastrointestinal: Positive for diarrhea and nausea.  Genitourinary: Negative.   Musculoskeletal: Positive for joint pain and myalgias.  Skin: Negative.   Neurological: Negative.   Endo/Heme/Allergies: Negative.   Psychiatric/Behavioral: Negative.      Physical Exam:  vitals were not taken for this visit.   Physical Exam Vitals reviewed.  HENT:      Head: Normocephalic and atraumatic.  Eyes:     Pupils: Pupils are equal, round, and reactive to light.  Cardiovascular:     Rate and Rhythm: Normal rate and regular rhythm.     Heart sounds: Normal heart sounds.  Pulmonary:     Effort: Pulmonary effort is normal.     Breath sounds: Normal breath sounds.  Abdominal:     General: Bowel sounds are normal.     Palpations: Abdomen is soft.  Musculoskeletal:        General: No tenderness or deformity. Normal range of motion.     Cervical back: Normal range of motion.  Lymphadenopathy:     Cervical: No cervical adenopathy.  Skin:    General: Skin is warm and dry.     Findings: No erythema or rash.  Neurological:     Mental Status: She is alert and oriented to person, place, and time.  Psychiatric:        Behavior: Behavior normal.        Thought Content: Thought content normal.        Judgment: Judgment normal.      Lab Results  Component Value Date   WBC 5.9 12/29/2019   HGB 11.3 (L) 12/29/2019   HCT 35.1 (L) 12/29/2019   MCV 104.8 (H) 12/29/2019   PLT 520 (H) 12/29/2019     Chemistry      Component Value Date/Time   NA 139 12/29/2019 1404   NA 144 09/11/2017 1335   NA 138 07/31/2016 0947   K 5.2 (H) 12/29/2019 1404   K 4.2 09/11/2017 1335   K 4.3 07/31/2016 0947   CL 101 12/29/2019 1404   CL 104 09/11/2017 1335   CO2 30 12/29/2019 1404   CO2 28 09/11/2017 1335   CO2 27 07/31/2016 0947   BUN 16 12/29/2019 1404   BUN 17 09/11/2017 1335   BUN 14.7 07/31/2016 0947   CREATININE 0.60 12/29/2019 1404   CREATININE 0.9 09/11/2017 1335   CREATININE 0.7 07/31/2016 0947      Component Value Date/Time   CALCIUM 9.9 12/29/2019 1404   CALCIUM 9.0 09/11/2017 1335   CALCIUM 9.2 07/31/2016 0947   ALKPHOS 70 12/29/2019 1404   ALKPHOS 102 (H) 09/11/2017 1335   ALKPHOS 70 07/31/2016 0947   AST 18 12/29/2019 1404   AST 18 07/31/2016 0947   ALT 8 12/29/2019 1404   ALT 20 09/11/2017 1335   ALT 9 07/31/2016 0947    BILITOT 0.7 12/29/2019 1404   BILITOT 0.62 07/31/2016 0947         Impression and Plan: Ms. Mandala is 79 year old white female. She has a clear autoimmune issue. She has pernicious anemia. She has vitiligo. She has hypo-thyroidism.  She has skin over this hip fracture.  I am sure she will.  I do feel bad that she had this.  I would like to see her back in 2 months.  I want to make sure we follow-up with this thrombocytosis.  Hopefully, it is just reactive.  I am happy that the CT scan looks better.  I do not see a reason to do another CT scan in the future unless there are symptoms.    Volanda Napoleon, MD 3/11/20212:55 PM

## 2019-12-30 LAB — IRON AND TIBC
Iron: 70 ug/dL (ref 41–142)
Saturation Ratios: 29 % (ref 21–57)
TIBC: 241 ug/dL (ref 236–444)
UIBC: 171 ug/dL (ref 120–384)

## 2019-12-30 LAB — FERRITIN: Ferritin: 519 ng/mL — ABNORMAL HIGH (ref 11–307)

## 2020-02-27 ENCOUNTER — Other Ambulatory Visit: Payer: Self-pay

## 2020-02-27 ENCOUNTER — Inpatient Hospital Stay: Payer: Medicare Other | Attending: Hematology & Oncology

## 2020-02-27 ENCOUNTER — Inpatient Hospital Stay (HOSPITAL_BASED_OUTPATIENT_CLINIC_OR_DEPARTMENT_OTHER): Payer: Medicare Other | Admitting: Hematology & Oncology

## 2020-02-27 ENCOUNTER — Telehealth: Payer: Self-pay | Admitting: Hematology & Oncology

## 2020-02-27 ENCOUNTER — Encounter: Payer: Self-pay | Admitting: Hematology & Oncology

## 2020-02-27 VITALS — BP 109/61 | HR 77 | Temp 95.3°F | Resp 18 | Ht 63.0 in | Wt 104.1 lb

## 2020-02-27 DIAGNOSIS — D693 Immune thrombocytopenic purpura: Secondary | ICD-10-CM | POA: Insufficient documentation

## 2020-02-27 DIAGNOSIS — D500 Iron deficiency anemia secondary to blood loss (chronic): Secondary | ICD-10-CM

## 2020-02-27 DIAGNOSIS — M069 Rheumatoid arthritis, unspecified: Secondary | ICD-10-CM | POA: Insufficient documentation

## 2020-02-27 DIAGNOSIS — D510 Vitamin B12 deficiency anemia due to intrinsic factor deficiency: Secondary | ICD-10-CM

## 2020-02-27 DIAGNOSIS — Z79899 Other long term (current) drug therapy: Secondary | ICD-10-CM | POA: Insufficient documentation

## 2020-02-27 DIAGNOSIS — R5383 Other fatigue: Secondary | ICD-10-CM | POA: Insufficient documentation

## 2020-02-27 DIAGNOSIS — E039 Hypothyroidism, unspecified: Secondary | ICD-10-CM | POA: Insufficient documentation

## 2020-02-27 DIAGNOSIS — L80 Vitiligo: Secondary | ICD-10-CM | POA: Diagnosis not present

## 2020-02-27 DIAGNOSIS — R634 Abnormal weight loss: Secondary | ICD-10-CM | POA: Diagnosis not present

## 2020-02-27 DIAGNOSIS — R911 Solitary pulmonary nodule: Secondary | ICD-10-CM | POA: Insufficient documentation

## 2020-02-27 LAB — CBC WITH DIFFERENTIAL (CANCER CENTER ONLY)
Abs Immature Granulocytes: 0.03 10*3/uL (ref 0.00–0.07)
Basophils Absolute: 0.1 10*3/uL (ref 0.0–0.1)
Basophils Relative: 1 %
Eosinophils Absolute: 0.1 10*3/uL (ref 0.0–0.5)
Eosinophils Relative: 1 %
HCT: 40.6 % (ref 36.0–46.0)
Hemoglobin: 13.3 g/dL (ref 12.0–15.0)
Immature Granulocytes: 1 %
Lymphocytes Relative: 48 %
Lymphs Abs: 2.8 10*3/uL (ref 0.7–4.0)
MCH: 33.7 pg (ref 26.0–34.0)
MCHC: 32.8 g/dL (ref 30.0–36.0)
MCV: 102.8 fL — ABNORMAL HIGH (ref 80.0–100.0)
Monocytes Absolute: 1 10*3/uL (ref 0.1–1.0)
Monocytes Relative: 17 %
Neutro Abs: 1.8 10*3/uL (ref 1.7–7.7)
Neutrophils Relative %: 32 %
Platelet Count: 312 10*3/uL (ref 150–400)
RBC: 3.95 MIL/uL (ref 3.87–5.11)
RDW: 13.1 % (ref 11.5–15.5)
WBC Count: 5.7 10*3/uL (ref 4.0–10.5)
nRBC: 0 % (ref 0.0–0.2)

## 2020-02-27 LAB — CMP (CANCER CENTER ONLY)
ALT: 16 U/L (ref 0–44)
AST: 27 U/L (ref 15–41)
Albumin: 4 g/dL (ref 3.5–5.0)
Alkaline Phosphatase: 72 U/L (ref 38–126)
Anion gap: 6 (ref 5–15)
BUN: 21 mg/dL (ref 8–23)
CO2: 30 mmol/L (ref 22–32)
Calcium: 9.8 mg/dL (ref 8.9–10.3)
Chloride: 100 mmol/L (ref 98–111)
Creatinine: 0.81 mg/dL (ref 0.44–1.00)
GFR, Est AFR Am: >60 mL/min (ref >60)
GFR, Estimated: >60 mL/min (ref >60)
Glucose, Bld: 92 mg/dL (ref 70–99)
Potassium: 4.4 mmol/L (ref 3.5–5.1)
Sodium: 136 mmol/L (ref 135–145)
Total Bilirubin: 0.4 mg/dL (ref 0.3–1.2)
Total Protein: 6.5 g/dL (ref 6.5–8.1)

## 2020-02-27 LAB — RETICULOCYTES
Immature Retic Fract: 3.5 % (ref 2.3–15.9)
RBC.: 3.98 MIL/uL (ref 3.87–5.11)
Retic Count, Absolute: 54.1 10*3/uL (ref 19.0–186.0)
Retic Ct Pct: 1.4 % (ref 0.4–3.1)

## 2020-02-27 LAB — VITAMIN B12: Vitamin B-12: 372 pg/mL (ref 180–914)

## 2020-02-27 LAB — SAVE SMEAR(SSMR), FOR PROVIDER SLIDE REVIEW

## 2020-02-27 NOTE — Progress Notes (Signed)
Hematology and Oncology Follow Up Visit  Kathleen Pace KI:[id number] Oct 17, 1941 79 y.o. 02/27/2020   Principle Diagnosis:  Chronic immune thrombocytopenia, remission. 2. Pernicious anemia. 3. Vitiligo. 4. Rheumatoid Arthritis  Current Therapy:    Vitamin B12 1 mg IM Q. month      Interim History:  Ms.  Pace is back for follow-up.  Her main complaint has been weight loss.  She is now down 104 pounds.  I am not sure as to why she has the weight loss.  Know that she is on Synthroid.  I am sure that her family doctor is checking her Synthroid levels.  She had her left hip surgery about 3 months ago.  She seems to be getting through this okay.  She does use a cane.  She is eating okay.  She is having no diarrhea.  There is no vomiting.  Again I do not know why that she is having the weight loss.  We did do a CT scan of the chest back in March.  This showed improvement in the consolidative airspace disease that was multifocal.  Showed resolution of disease in the right upper posterior lobe and medial left upper lobe.  Also improvement was noted in airway impaction and opacity in the right middle lobe.  There is a 7 mm nodule in the right middle and right lower lobe.  I am not sure that this is anything that we have to worry about.  I would not think this is anything that would be causing weight loss.  She has had no cough.  There is no bleeding.  She is having no rashes.  Her arthritis has not caused her any issues.  Overall, I would say her performance status is ECOG 2.     Medications:  Current Outpatient Medications:  .  acetaminophen (TYLENOL) 325 MG tablet, Take 650 mg by mouth every 6 (six) hours as needed.  , Disp: , Rfl:  .  albuterol (PROVENTIL HFA;VENTOLIN HFA) 108 (90 Base) MCG/ACT inhaler, Inhale into the lungs., Disp: , Rfl:  .  alendronate (FOSAMAX) 70 MG tablet, Take 1 tablet po weekly.  Stay upright for 1 hour following, Disp: , Rfl:  .  amLODipine (NORVASC) 2.5 MG  tablet, TAKE 1 TABLET BY MOUTH EVERY DAY, Disp: , Rfl:  .  CALCIUM-VITAMIN D PO, Take 1 capsule by mouth every morning. Calcium 600 mg  Vitamin D 200, Disp: , Rfl:  .  Cholecalciferol (VITAMIN D) 2000 UNITS tablet, Take 2,000 Units by mouth daily., Disp: , Rfl:  .  cyanocobalamin (,VITAMIN B-12,) 1000 MCG/ML injection, INJECT 1 ML ONCE MONTHLY, Disp: 3 mL, Rfl: 24 .  esomeprazole (NEXIUM) 20 MG capsule, Take 20 mg by mouth daily at 12 noon., Disp: , Rfl:  .  hydroxychloroquine (PLAQUENIL) 200 MG tablet, 200 mg daily. , Disp: , Rfl:  .  leflunomide (ARAVA) 10 MG tablet, Take 10 mg by mouth daily. , Disp: , Rfl:  .  levothyroxine (SYNTHROID, LEVOTHROID) 125 MCG tablet, 125 mcg daily. 08/31/2019 One day each week, will increase to 1.5 tabs., Disp: , Rfl:  .  Omega-3 Fatty Acids (OMEGA-3 FISH OIL PO), Take by mouth., Disp: , Rfl:  .  prednisoLONE acetate (PRED FORTE) 1 % ophthalmic suspension, Place 1 drop into both eyes 3 (three) times daily. , Disp: , Rfl:  .  sertraline (ZOLOFT) 50 MG tablet, Take 50 mg by mouth daily.  , Disp: , Rfl:  .  umeclidinium-vilanterol (ANORO ELLIPTA) 62.5-25 MCG/INH  AEPB, Inhale into the lungs daily. , Disp: , Rfl:  .  valsartan (DIOVAN) 320 MG tablet, Take by mouth daily. , Disp: , Rfl:  .  XIIDRA 5 % SOLN, Apply 1 drop into both eyes twice a day, Disp: , Rfl:   Allergies:  Allergies  Allergen Reactions  . Iodinated Diagnostic Agents Swelling  . Sulfa Antibiotics Other (See Comments)    Drops Blood Pressure  . Doxycycline Nausea Only  . Penicillins Rash  . Quinine Derivatives Other (See Comments)    Patient doesn't remember reaction    Past Medical History, Surgical history, Social history, and Family History were reviewed and updated.  Review of Systems: Review of Systems  Constitutional: Positive for malaise/fatigue.  HENT: Positive for congestion.   Eyes: Negative.   Respiratory: Positive for cough.   Cardiovascular: Positive for palpitations.   Gastrointestinal: Positive for diarrhea and nausea.  Genitourinary: Negative.   Musculoskeletal: Positive for joint pain and myalgias.  Skin: Negative.   Neurological: Negative.   Endo/Heme/Allergies: Negative.   Psychiatric/Behavioral: Negative.      Physical Exam:  height is 5\' 3"  (1.6 m) and weight is 104 lb 1.3 oz (47.2 kg). Her temporal temperature is 95.3 F (35.2 C) (abnormal). Her blood pressure is 109/61 and her pulse is 77. Her respiration is 18 and oxygen saturation is 97%.   Physical Exam Vitals reviewed.  HENT:     Head: Normocephalic and atraumatic.  Eyes:     Pupils: Pupils are equal, round, and reactive to light.  Cardiovascular:     Rate and Rhythm: Normal rate and regular rhythm.     Heart sounds: Normal heart sounds.  Pulmonary:     Effort: Pulmonary effort is normal.     Breath sounds: Normal breath sounds.  Abdominal:     General: Bowel sounds are normal.     Palpations: Abdomen is soft.  Musculoskeletal:        General: No tenderness or deformity. Normal range of motion.     Cervical back: Normal range of motion.  Lymphadenopathy:     Cervical: No cervical adenopathy.  Skin:    General: Skin is warm and dry.     Findings: No erythema or rash.  Neurological:     Mental Status: She is alert and oriented to person, place, and time.  Psychiatric:        Behavior: Behavior normal.        Thought Content: Thought content normal.        Judgment: Judgment normal.      Lab Results  Component Value Date   WBC 5.7 02/27/2020   HGB 13.3 02/27/2020   HCT 40.6 02/27/2020   MCV 102.8 (H) 02/27/2020   PLT 312 02/27/2020     Chemistry      Component Value Date/Time   NA 136 02/27/2020 1126   NA 144 09/11/2017 1335   NA 138 07/31/2016 0947   K 4.4 02/27/2020 1126   K 4.2 09/11/2017 1335   K 4.3 07/31/2016 0947   CL 100 02/27/2020 1126   CL 104 09/11/2017 1335   CO2 30 02/27/2020 1126   CO2 28 09/11/2017 1335   CO2 27 07/31/2016 0947   BUN 21  02/27/2020 1126   BUN 17 09/11/2017 1335   BUN 14.7 07/31/2016 0947   CREATININE 0.81 02/27/2020 1126   CREATININE 0.9 09/11/2017 1335   CREATININE 0.7 07/31/2016 0947      Component Value Date/Time   CALCIUM 9.8 02/27/2020  1126   CALCIUM 9.0 09/11/2017 1335   CALCIUM 9.2 07/31/2016 0947   ALKPHOS 72 02/27/2020 1126   ALKPHOS 102 (H) 09/11/2017 1335   ALKPHOS 70 07/31/2016 0947   AST 27 02/27/2020 1126   AST 18 07/31/2016 0947   ALT 16 02/27/2020 1126   ALT 20 09/11/2017 1335   ALT 9 07/31/2016 0947   BILITOT 0.4 02/27/2020 1126   BILITOT 0.62 07/31/2016 0947         Impression and Plan: Ms. Bandemer is 79 year old white female. She has a clear autoimmune issue. She has pernicious anemia. She has vitiligo. She has hypo-thyroidism.  Again I am not sure as to why she has the weight loss.  I do not see anything that is related to a malignancy.  We will have to follow her a little bit more closely in my opinion.  I would like to see her back in 6 weeks time.     Volanda Napoleon, MD 5/10/202112:01 PM

## 2020-02-27 NOTE — Telephone Encounter (Signed)
Appointments scheduled calendar printed per 5/10 los

## 2020-02-28 LAB — IRON AND TIBC
Iron: 94 ug/dL (ref 41–142)
Saturation Ratios: 36 % (ref 21–57)
TIBC: 263 ug/dL (ref 236–444)
UIBC: 169 ug/dL (ref 120–384)

## 2020-02-28 LAB — FERRITIN: Ferritin: 182 ng/mL (ref 11–307)

## 2020-04-11 ENCOUNTER — Telehealth: Payer: Self-pay | Admitting: Hematology & Oncology

## 2020-04-11 ENCOUNTER — Other Ambulatory Visit: Payer: Self-pay

## 2020-04-11 ENCOUNTER — Inpatient Hospital Stay: Payer: Medicare Other | Attending: Hematology & Oncology

## 2020-04-11 ENCOUNTER — Encounter: Payer: Self-pay | Admitting: Hematology & Oncology

## 2020-04-11 ENCOUNTER — Inpatient Hospital Stay (HOSPITAL_BASED_OUTPATIENT_CLINIC_OR_DEPARTMENT_OTHER): Payer: Medicare Other | Admitting: Hematology & Oncology

## 2020-04-11 VITALS — BP 122/62 | HR 77 | Temp 98.3°F | Resp 16 | Wt 105.4 lb

## 2020-04-11 DIAGNOSIS — R5383 Other fatigue: Secondary | ICD-10-CM | POA: Diagnosis not present

## 2020-04-11 DIAGNOSIS — E039 Hypothyroidism, unspecified: Secondary | ICD-10-CM | POA: Diagnosis not present

## 2020-04-11 DIAGNOSIS — M069 Rheumatoid arthritis, unspecified: Secondary | ICD-10-CM | POA: Diagnosis not present

## 2020-04-11 DIAGNOSIS — D693 Immune thrombocytopenic purpura: Secondary | ICD-10-CM

## 2020-04-11 DIAGNOSIS — Z79899 Other long term (current) drug therapy: Secondary | ICD-10-CM | POA: Insufficient documentation

## 2020-04-11 DIAGNOSIS — D500 Iron deficiency anemia secondary to blood loss (chronic): Secondary | ICD-10-CM | POA: Diagnosis not present

## 2020-04-11 DIAGNOSIS — D510 Vitamin B12 deficiency anemia due to intrinsic factor deficiency: Secondary | ICD-10-CM

## 2020-04-11 DIAGNOSIS — L80 Vitiligo: Secondary | ICD-10-CM | POA: Insufficient documentation

## 2020-04-11 LAB — CBC WITH DIFFERENTIAL (CANCER CENTER ONLY)
Abs Immature Granulocytes: 0.01 10*3/uL (ref 0.00–0.07)
Basophils Absolute: 0.1 10*3/uL (ref 0.0–0.1)
Basophils Relative: 2 %
Eosinophils Absolute: 0.1 10*3/uL (ref 0.0–0.5)
Eosinophils Relative: 1 %
HCT: 39.8 % (ref 36.0–46.0)
Hemoglobin: 12.8 g/dL (ref 12.0–15.0)
Immature Granulocytes: 0 %
Lymphocytes Relative: 48 %
Lymphs Abs: 2.7 10*3/uL (ref 0.7–4.0)
MCH: 33.9 pg (ref 26.0–34.0)
MCHC: 32.2 g/dL (ref 30.0–36.0)
MCV: 105.3 fL — ABNORMAL HIGH (ref 80.0–100.0)
Monocytes Absolute: 0.9 10*3/uL (ref 0.1–1.0)
Monocytes Relative: 16 %
Neutro Abs: 1.8 10*3/uL (ref 1.7–7.7)
Neutrophils Relative %: 33 %
Platelet Count: 375 10*3/uL (ref 150–400)
RBC: 3.78 MIL/uL — ABNORMAL LOW (ref 3.87–5.11)
RDW: 13.3 % (ref 11.5–15.5)
WBC Count: 5.5 10*3/uL (ref 4.0–10.5)
nRBC: 0 % (ref 0.0–0.2)

## 2020-04-11 LAB — CMP (CANCER CENTER ONLY)
ALT: 21 U/L (ref 0–44)
AST: 30 U/L (ref 15–41)
Albumin: 4.1 g/dL (ref 3.5–5.0)
Alkaline Phosphatase: 96 U/L (ref 38–126)
Anion gap: 8 (ref 5–15)
BUN: 14 mg/dL (ref 8–23)
CO2: 29 mmol/L (ref 22–32)
Calcium: 9.7 mg/dL (ref 8.9–10.3)
Chloride: 103 mmol/L (ref 98–111)
Creatinine: 0.64 mg/dL (ref 0.44–1.00)
GFR, Est AFR Am: >60 mL/min (ref >60)
GFR, Estimated: >60 mL/min (ref >60)
Glucose, Bld: 100 mg/dL — ABNORMAL HIGH (ref 70–99)
Potassium: 4.1 mmol/L (ref 3.5–5.1)
Sodium: 140 mmol/L (ref 135–145)
Total Bilirubin: 0.7 mg/dL (ref 0.3–1.2)
Total Protein: 6.9 g/dL (ref 6.5–8.1)

## 2020-04-11 LAB — RETICULOCYTES
Immature Retic Fract: 5.1 % (ref 2.3–15.9)
RBC.: 3.73 MIL/uL — ABNORMAL LOW (ref 3.87–5.11)
Retic Count, Absolute: 85 10*3/uL (ref 19.0–186.0)
Retic Ct Pct: 2.3 % (ref 0.4–3.1)

## 2020-04-11 LAB — VITAMIN B12: Vitamin B-12: 244 pg/mL (ref 180–914)

## 2020-04-11 NOTE — Progress Notes (Signed)
Hematology and Oncology Follow Up Visit  Kathleen Pace [id number] 16-Jul-1941 79 y.o. 04/11/2020   Principle Diagnosis:  Chronic immune thrombocytopenia, remission. 2. Pernicious anemia. 3. Vitiligo. 4. Rheumatoid Arthritis  Current Therapy:    Vitamin B12 1 mg IM Q. month      Interim History:  Ms.  Armentrout is back for follow-up.  He finally is beginning to feel better.  She started to gain a little bit of weight.  She has more energy.  Despite all of our efforts and tests, we really cannot find out why she had lost weight.  She is getting ready to go camping nearby.  She be doing this I think over July 4 weekend.  After that, she and her husband will be going to the [employer] for a week or so and go fishing.  She has had no problems with nausea or vomiting.  She has had no diarrhea.  She has had no cough or shortness of breath.  There is been no rashes.  She has had no chest pain.  Currently, her performance status is ECOG 1..   Medications:  Current Outpatient Medications:  .  acetaminophen (TYLENOL) 325 MG tablet, Take 650 mg by mouth every 6 (six) hours as needed.  , Disp: , Rfl:  .  albuterol (PROVENTIL HFA;VENTOLIN HFA) 108 (90 Base) MCG/ACT inhaler, Inhale into the lungs., Disp: , Rfl:  .  alendronate (FOSAMAX) 70 MG tablet, Take 1 tablet po weekly.  Stay upright for 1 hour following, Disp: , Rfl:  .  amLODipine (NORVASC) 2.5 MG tablet, TAKE 1 TABLET BY MOUTH EVERY DAY, Disp: , Rfl:  .  CALCIUM-VITAMIN D PO, Take 1 capsule by mouth every morning. Calcium 600 mg  Vitamin D 200, Disp: , Rfl:  .  Cholecalciferol (VITAMIN D) 2000 UNITS tablet, Take 2,000 Units by mouth daily., Disp: , Rfl:  .  cyanocobalamin (,VITAMIN B-12,) 1000 MCG/ML injection, INJECT 1 ML ONCE MONTHLY, Disp: 3 mL, Rfl: 24 .  esomeprazole (NEXIUM) 20 MG capsule, Take 20 mg by mouth daily at 12 noon., Disp: , Rfl:  .  hydroxychloroquine (PLAQUENIL) 200 MG tablet, 200 mg daily. , Disp: , Rfl:  .   leflunomide (ARAVA) 10 MG tablet, Take 10 mg by mouth daily. , Disp: , Rfl:  .  levothyroxine (SYNTHROID, LEVOTHROID) 125 MCG tablet, 125 mcg daily. 08/31/2019 One day each week, will increase to 1.5 tabs., Disp: , Rfl:  .  Omega-3 Fatty Acids (OMEGA-3 FISH OIL PO), Take by mouth., Disp: , Rfl:  .  prednisoLONE acetate (PRED FORTE) 1 % ophthalmic suspension, Place 1 drop into both eyes 3 (three) times daily. , Disp: , Rfl:  .  sertraline (ZOLOFT) 50 MG tablet, Take 50 mg by mouth daily.  , Disp: , Rfl:  .  umeclidinium-vilanterol (ANORO ELLIPTA) 62.5-25 MCG/INH AEPB, Inhale into the lungs daily. , Disp: , Rfl:  .  valsartan (DIOVAN) 320 MG tablet, Take by mouth daily. , Disp: , Rfl:  .  XIIDRA 5 % SOLN, Apply 1 drop into both eyes twice a day, Disp: , Rfl:   Allergies:  Allergies  Allergen Reactions  . Iodinated Diagnostic Agents Swelling  . Sulfa Antibiotics Other (See Comments)    Drops Blood Pressure  . Doxycycline Nausea Only  . Penicillins Rash  . Quinine Derivatives Other (See Comments)    Patient doesn't remember reaction    Past Medical History, Surgical history, Social history, and Family History were reviewed and updated.  Review of Systems: Review of Systems  Constitutional: Positive for malaise/fatigue.  HENT: Positive for congestion.   Eyes: Negative.   Respiratory: Positive for cough.   Cardiovascular: Positive for palpitations.  Gastrointestinal: Positive for diarrhea and nausea.  Genitourinary: Negative.   Musculoskeletal: Positive for joint pain and myalgias.  Skin: Negative.   Neurological: Negative.   Endo/Heme/Allergies: Negative.   Psychiatric/Behavioral: Negative.      Physical Exam:  vitals were not taken for this visit.   Physical Exam Vitals reviewed.  HENT:     Head: Normocephalic and atraumatic.  Eyes:     Pupils: Pupils are equal, round, and reactive to light.  Cardiovascular:     Rate and Rhythm: Normal rate and regular rhythm.     Heart  sounds: Normal heart sounds.  Pulmonary:     Effort: Pulmonary effort is normal.     Breath sounds: Normal breath sounds.  Abdominal:     General: Bowel sounds are normal.     Palpations: Abdomen is soft.  Musculoskeletal:        General: No tenderness or deformity. Normal range of motion.     Cervical back: Normal range of motion.  Lymphadenopathy:     Cervical: No cervical adenopathy.  Skin:    General: Skin is warm and dry.     Findings: No erythema or rash.  Neurological:     Mental Status: She is alert and oriented to person, place, and time.  Psychiatric:        Behavior: Behavior normal.        Thought Content: Thought content normal.        Judgment: Judgment normal.      Lab Results  Component Value Date   WBC 5.5 04/11/2020   HGB 12.8 04/11/2020   HCT 39.8 04/11/2020   MCV 105.3 (H) 04/11/2020   PLT 375 04/11/2020     Chemistry      Component Value Date/Time   NA 140 04/11/2020 1135   NA 144 09/11/2017 1335   NA 138 07/31/2016 0947   K 4.1 04/11/2020 1135   K 4.2 09/11/2017 1335   K 4.3 07/31/2016 0947   CL 103 04/11/2020 1135   CL 104 09/11/2017 1335   CO2 29 04/11/2020 1135   CO2 28 09/11/2017 1335   CO2 27 07/31/2016 0947   BUN 14 04/11/2020 1135   BUN 17 09/11/2017 1335   BUN 14.7 07/31/2016 0947   CREATININE 0.64 04/11/2020 1135   CREATININE 0.9 09/11/2017 1335   CREATININE 0.7 07/31/2016 0947      Component Value Date/Time   CALCIUM 9.7 04/11/2020 1135   CALCIUM 9.0 09/11/2017 1335   CALCIUM 9.2 07/31/2016 0947   ALKPHOS 96 04/11/2020 1135   ALKPHOS 102 (H) 09/11/2017 1335   ALKPHOS 70 07/31/2016 0947   AST 30 04/11/2020 1135   AST 18 07/31/2016 0947   ALT 21 04/11/2020 1135   ALT 20 09/11/2017 1335   ALT 9 07/31/2016 0947   BILITOT 0.7 04/11/2020 1135   BILITOT 0.62 07/31/2016 0947         Impression and Plan: Ms. Alwin is 79 year old white female. She has a clear autoimmune issue. She has pernicious anemia. She has  vitiligo. She has hypo-thyroidism.  Finally, we can start moving her appointments out little more.  I feel much better now that she is gaining a little bit of weight.  She just looks better.  Her labs look great.  Her overall "color" look good.  I am just glad that her quality of life is improving so she can enjoy her life.    We will get her back after Labor Day.     Volanda Napoleon, MD 6/23/202112:17 PM

## 2020-04-11 NOTE — Telephone Encounter (Signed)
Appointments scheduled calendar printed per 6/23 los 

## 2020-04-12 LAB — IRON AND TIBC
Iron: 146 ug/dL — ABNORMAL HIGH (ref 41–142)
Saturation Ratios: 53 % (ref 21–57)
TIBC: 278 ug/dL (ref 236–444)
UIBC: 132 ug/dL (ref 120–384)

## 2020-04-12 LAB — FERRITIN: Ferritin: 134 ng/mL (ref 11–307)

## 2020-04-12 LAB — TSH: TSH: 1.003 u[IU]/mL (ref 0.308–3.960)

## 2020-07-19 ENCOUNTER — Other Ambulatory Visit: Payer: Self-pay

## 2020-07-19 ENCOUNTER — Inpatient Hospital Stay (HOSPITAL_BASED_OUTPATIENT_CLINIC_OR_DEPARTMENT_OTHER): Payer: Medicare Other | Admitting: Hematology & Oncology

## 2020-07-19 ENCOUNTER — Telehealth: Payer: Self-pay | Admitting: Hematology & Oncology

## 2020-07-19 ENCOUNTER — Encounter: Payer: Self-pay | Admitting: Hematology & Oncology

## 2020-07-19 ENCOUNTER — Inpatient Hospital Stay: Payer: Medicare Other | Attending: Hematology & Oncology

## 2020-07-19 VITALS — BP 152/64 | HR 88 | Temp 98.3°F | Resp 19 | Ht 63.5 in | Wt 107.0 lb

## 2020-07-19 DIAGNOSIS — L80 Vitiligo: Secondary | ICD-10-CM | POA: Insufficient documentation

## 2020-07-19 DIAGNOSIS — E039 Hypothyroidism, unspecified: Secondary | ICD-10-CM | POA: Diagnosis not present

## 2020-07-19 DIAGNOSIS — D693 Immune thrombocytopenic purpura: Secondary | ICD-10-CM | POA: Insufficient documentation

## 2020-07-19 DIAGNOSIS — D500 Iron deficiency anemia secondary to blood loss (chronic): Secondary | ICD-10-CM | POA: Diagnosis not present

## 2020-07-19 DIAGNOSIS — K909 Intestinal malabsorption, unspecified: Secondary | ICD-10-CM | POA: Diagnosis not present

## 2020-07-19 DIAGNOSIS — M069 Rheumatoid arthritis, unspecified: Secondary | ICD-10-CM | POA: Diagnosis not present

## 2020-07-19 DIAGNOSIS — D510 Vitamin B12 deficiency anemia due to intrinsic factor deficiency: Secondary | ICD-10-CM | POA: Insufficient documentation

## 2020-07-19 DIAGNOSIS — Z79899 Other long term (current) drug therapy: Secondary | ICD-10-CM | POA: Insufficient documentation

## 2020-07-19 LAB — CMP (CANCER CENTER ONLY)
ALT: 18 U/L (ref 0–44)
AST: 26 U/L (ref 15–41)
Albumin: 3.9 g/dL (ref 3.5–5.0)
Alkaline Phosphatase: 91 U/L (ref 38–126)
Anion gap: 6 (ref 5–15)
BUN: 15 mg/dL (ref 8–23)
CO2: 30 mmol/L (ref 22–32)
Calcium: 9.8 mg/dL (ref 8.9–10.3)
Chloride: 102 mmol/L (ref 98–111)
Creatinine: 0.85 mg/dL (ref 0.44–1.00)
GFR, Est AFR Am: >60 mL/min (ref >60)
GFR, Estimated: >60 mL/min (ref >60)
Glucose, Bld: 102 mg/dL — ABNORMAL HIGH (ref 70–99)
Potassium: 4.8 mmol/L (ref 3.5–5.1)
Sodium: 138 mmol/L (ref 135–145)
Total Bilirubin: 0.5 mg/dL (ref 0.3–1.2)
Total Protein: 6.8 g/dL (ref 6.5–8.1)

## 2020-07-19 LAB — CBC WITH DIFFERENTIAL (CANCER CENTER ONLY)
Abs Immature Granulocytes: 0.01 10*3/uL (ref 0.00–0.07)
Basophils Absolute: 0.1 10*3/uL (ref 0.0–0.1)
Basophils Relative: 1 %
Eosinophils Absolute: 0.1 10*3/uL (ref 0.0–0.5)
Eosinophils Relative: 1 %
HCT: 38.9 % (ref 36.0–46.0)
Hemoglobin: 12.6 g/dL (ref 12.0–15.0)
Immature Granulocytes: 0 %
Lymphocytes Relative: 35 %
Lymphs Abs: 2.1 10*3/uL (ref 0.7–4.0)
MCH: 33.6 pg (ref 26.0–34.0)
MCHC: 32.4 g/dL (ref 30.0–36.0)
MCV: 103.7 fL — ABNORMAL HIGH (ref 80.0–100.0)
Monocytes Absolute: 0.9 10*3/uL (ref 0.1–1.0)
Monocytes Relative: 14 %
Neutro Abs: 2.9 10*3/uL (ref 1.7–7.7)
Neutrophils Relative %: 49 %
Platelet Count: 361 10*3/uL (ref 150–400)
RBC: 3.75 MIL/uL — ABNORMAL LOW (ref 3.87–5.11)
RDW: 13.8 % (ref 11.5–15.5)
WBC Count: 6.1 10*3/uL (ref 4.0–10.5)
nRBC: 0 % (ref 0.0–0.2)

## 2020-07-19 LAB — VITAMIN B12: Vitamin B-12: 409 pg/mL (ref 180–914)

## 2020-07-19 NOTE — Telephone Encounter (Signed)
Appointments scheduled Calendar printed per 9/30 los

## 2020-07-19 NOTE — Progress Notes (Signed)
Hematology and Oncology Follow Up Visit  Kathleen Pace [id number] 1941-04-16 79 y.o. 07/19/2020   Principle Diagnosis:  Chronic immune thrombocytopenia, remission. 2. Pernicious anemia. 3. Vitiligo. 4. Rheumatoid Arthritis  Current Therapy:    Vitamin B12 1 mg IM Q. month      Interim History:  Ms.  Pace is back for follow-up.  She is doing pretty well.  She and her husband to go to the [employer].  That a wonderful time out there even though things were crowded.  She is still recovering from the hip fracture on the left hip.  This was back in February.  I think that she is doing some physical therapy for this.  There has been no problems with bleeding or bruising.  Her appetite has been okay.  She has had no issues with her weight.  She is try to gain a little weight now.  There is no change in bowel or bladder habits.  Overall, her performance status is ECOG 1.     Medications:  Current Outpatient Medications:  .  acetaminophen (TYLENOL) 325 MG tablet, Take 650 mg by mouth every 6 (six) hours as needed.  , Disp: , Rfl:  .  albuterol (PROVENTIL HFA;VENTOLIN HFA) 108 (90 Base) MCG/ACT inhaler, Inhale into the lungs. (Patient not taking: Reported on 04/11/2020), Disp: , Rfl:  .  alendronate (FOSAMAX) 70 MG tablet, Take 1 tablet po weekly.  Stay upright for 1 hour following, Disp: , Rfl:  .  amLODipine (NORVASC) 2.5 MG tablet, TAKE 1 TABLET BY MOUTH EVERY DAY, Disp: , Rfl:  .  CALCIUM-VITAMIN D PO, Take 1 capsule by mouth every morning. Calcium 600 mg  Vitamin D 200, Disp: , Rfl:  .  Cholecalciferol (VITAMIN D) 2000 UNITS tablet, Take 2,000 Units by mouth daily., Disp: , Rfl:  .  cyanocobalamin (,VITAMIN B-12,) 1000 MCG/ML injection, INJECT 1 ML ONCE MONTHLY, Disp: 3 mL, Rfl: 24 .  esomeprazole (NEXIUM) 20 MG capsule, Take 20 mg by mouth daily at 12 noon., Disp: , Rfl:  .  hydroxychloroquine (PLAQUENIL) 200 MG tablet, 200 mg daily. , Disp: , Rfl:  .  leflunomide (ARAVA) 10  MG tablet, Take 10 mg by mouth daily. Take 1 and 1/2 tablet total of 15 mg daily., Disp: , Rfl:  .  levothyroxine (SYNTHROID, LEVOTHROID) 125 MCG tablet, 125 mcg daily. 08/31/2019 One day each week, will increase to 1.5 tabs., Disp: , Rfl:  .  Omega-3 Fatty Acids (OMEGA-3 FISH OIL PO), Take by mouth., Disp: , Rfl:  .  prednisoLONE acetate (PRED FORTE) 1 % ophthalmic suspension, Place 1 drop into both eyes 3 (three) times daily. , Disp: , Rfl:  .  sertraline (ZOLOFT) 50 MG tablet, Take 50 mg by mouth daily.  , Disp: , Rfl:  .  umeclidinium-vilanterol (ANORO ELLIPTA) 62.5-25 MCG/INH AEPB, Inhale into the lungs daily. , Disp: , Rfl:  .  valsartan (DIOVAN) 320 MG tablet, Take by mouth daily. , Disp: , Rfl:  .  XIIDRA 5 % SOLN, Apply 1 drop into both eyes twice a day, Disp: , Rfl:   Allergies:  Allergies  Allergen Reactions  . Iodinated Diagnostic Agents Swelling  . Sulfa Antibiotics Other (See Comments)    Drops Blood Pressure  . Doxycycline Nausea Only  . Penicillins Rash  . Quinine Derivatives Other (See Comments)    Patient doesn't remember reaction    Past Medical History, Surgical history, Social history, and Family History were reviewed and updated.  Review of Systems: Review of Systems  Constitutional: Positive for malaise/fatigue.  HENT: Positive for congestion.   Eyes: Negative.   Respiratory: Positive for cough.   Cardiovascular: Positive for palpitations.  Gastrointestinal: Positive for diarrhea and nausea.  Genitourinary: Negative.   Musculoskeletal: Positive for joint pain and myalgias.  Skin: Negative.   Neurological: Negative.   Endo/Heme/Allergies: Negative.   Psychiatric/Behavioral: Negative.      Physical Exam:  height is 5' 3.5" (1.613 m) and weight is 107 lb (48.5 kg). Her oral temperature is 98.3 F (36.8 C). Her blood pressure is 152/64 (abnormal) and her pulse is 88. Her respiration is 19 and oxygen saturation is 100%.   Physical Exam Vitals reviewed.   HENT:     Head: Normocephalic and atraumatic.  Eyes:     Pupils: Pupils are equal, round, and reactive to light.  Cardiovascular:     Rate and Rhythm: Normal rate and regular rhythm.     Heart sounds: Normal heart sounds.  Pulmonary:     Effort: Pulmonary effort is normal.     Breath sounds: Normal breath sounds.  Abdominal:     General: Bowel sounds are normal.     Palpations: Abdomen is soft.  Musculoskeletal:        General: No tenderness or deformity. Normal range of motion.     Cervical back: Normal range of motion.  Lymphadenopathy:     Cervical: No cervical adenopathy.  Skin:    General: Skin is warm and dry.     Findings: No erythema or rash.  Neurological:     Mental Status: She is alert and oriented to person, place, and time.  Psychiatric:        Behavior: Behavior normal.        Thought Content: Thought content normal.        Judgment: Judgment normal.      Lab Results  Component Value Date   WBC 6.1 07/19/2020   HGB 12.6 07/19/2020   HCT 38.9 07/19/2020   MCV 103.7 (H) 07/19/2020   PLT 361 07/19/2020     Chemistry      Component Value Date/Time   NA 138 07/19/2020 1321   NA 144 09/11/2017 1335   NA 138 07/31/2016 0947   K 4.8 07/19/2020 1321   K 4.2 09/11/2017 1335   K 4.3 07/31/2016 0947   CL 102 07/19/2020 1321   CL 104 09/11/2017 1335   CO2 30 07/19/2020 1321   CO2 28 09/11/2017 1335   CO2 27 07/31/2016 0947   BUN 15 07/19/2020 1321   BUN 17 09/11/2017 1335   BUN 14.7 07/31/2016 0947   CREATININE 0.85 07/19/2020 1321   CREATININE 0.9 09/11/2017 1335   CREATININE 0.7 07/31/2016 0947      Component Value Date/Time   CALCIUM 9.8 07/19/2020 1321   CALCIUM 9.0 09/11/2017 1335   CALCIUM 9.2 07/31/2016 0947   ALKPHOS 91 07/19/2020 1321   ALKPHOS 102 (H) 09/11/2017 1335   ALKPHOS 70 07/31/2016 0947   AST 26 07/19/2020 1321   AST 18 07/31/2016 0947   ALT 18 07/19/2020 1321   ALT 20 09/11/2017 1335   ALT 9 07/31/2016 0947   BILITOT 0.5  07/19/2020 1321   BILITOT 0.62 07/31/2016 0947         Impression and Plan: Kathleen Pace is 79 year old white female. She has a clear autoimmune issue. She has pernicious anemia. She has vitiligo. She has hypo-thyroidism.  Everything looks okay from my point of  view.  I really think that we can now get her back after the holidays and get her back next year.     Volanda Napoleon, MD 9/30/20212:27 PM

## 2020-07-20 LAB — IRON AND TIBC
Iron: 49 ug/dL (ref 41–142)
Saturation Ratios: 15 % — ABNORMAL LOW (ref 21–57)
TIBC: 320 ug/dL (ref 236–444)
UIBC: 271 ug/dL (ref 120–384)

## 2020-07-20 LAB — FERRITIN: Ferritin: 48 ng/mL (ref 11–307)

## 2020-07-26 ENCOUNTER — Other Ambulatory Visit: Payer: Self-pay | Admitting: Family

## 2020-07-26 ENCOUNTER — Inpatient Hospital Stay: Payer: Medicare Other | Attending: Hematology & Oncology

## 2020-07-26 ENCOUNTER — Other Ambulatory Visit: Payer: Self-pay

## 2020-07-26 VITALS — BP 147/66 | HR 70 | Temp 97.9°F | Resp 17

## 2020-07-26 DIAGNOSIS — D693 Immune thrombocytopenic purpura: Secondary | ICD-10-CM | POA: Diagnosis not present

## 2020-07-26 DIAGNOSIS — Z79899 Other long term (current) drug therapy: Secondary | ICD-10-CM | POA: Insufficient documentation

## 2020-07-26 DIAGNOSIS — D500 Iron deficiency anemia secondary to blood loss (chronic): Secondary | ICD-10-CM | POA: Insufficient documentation

## 2020-07-26 MED ORDER — FAMOTIDINE IN NACL 20-0.9 MG/50ML-% IV SOLN
INTRAVENOUS | Status: AC
  Filled 2020-07-26: qty 50

## 2020-07-26 MED ORDER — METHYLPREDNISOLONE SODIUM SUCC 125 MG IJ SOLR
INTRAMUSCULAR | Status: AC
  Filled 2020-07-26: qty 2

## 2020-07-26 MED ORDER — SODIUM CHLORIDE 0.9 % IV SOLN
510.0000 mg | Freq: Once | INTRAVENOUS | Status: AC
  Administered 2020-07-26: 510 mg via INTRAVENOUS
  Filled 2020-07-26: qty 510

## 2020-07-26 MED ORDER — FAMOTIDINE IN NACL 20-0.9 MG/50ML-% IV SOLN
20.0000 mg | Freq: Once | INTRAVENOUS | Status: AC
  Administered 2020-07-26: 20 mg via INTRAVENOUS

## 2020-07-26 MED ORDER — METHYLPREDNISOLONE SODIUM SUCC 125 MG IJ SOLR
125.0000 mg | Freq: Once | INTRAMUSCULAR | Status: AC
  Administered 2020-07-26: 125 mg via INTRAVENOUS

## 2020-07-26 NOTE — Patient Instructions (Signed)

## 2020-07-28 IMAGING — PT NM PET TUM IMG RESTAG (PS) SKULL BASE T - THIGH
1 of 8 series · 1 of 25 positions shown · non-contrast
Comparison: PET-CT 06/02/2019

CLINICAL DATA: Initial treatment strategy for lung nodule.

EXAM:
NUCLEAR MEDICINE PET SKULL BASE TO THIGH
TECHNIQUE: 5.68 mCi F-18 FDG was injected intravenously. Full-ring PET imaging
was performed from the skull base to thigh after the radiotracer. CT
data was obtained and used for attenuation correction and anatomic
localization.
Fasting blood glucose: 81 mg/dl

[Series 4: ct sk_thigh 5.0 b31f · axial · 5.0mm · 0.89mm/px · 1 of 218 slices shown]
[im 218/218  brain]
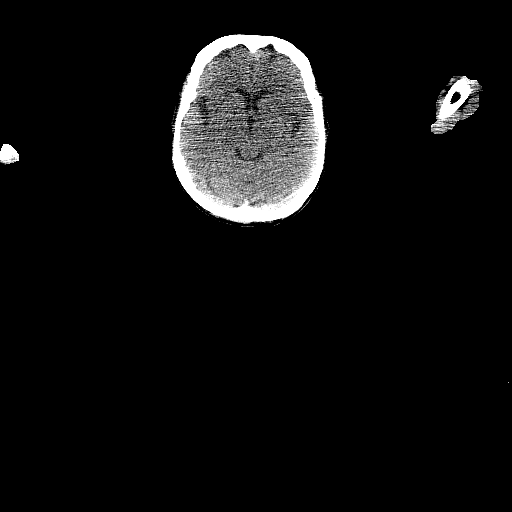

[1 of 25 positions shown; findings below may reference images not displayed]

FINDINGS: Mediastinal blood pool activity: SUV max

Liver activity: SUV max NA

NECK: No hypermetabolic lymph nodes in the neck.

Incidental CT findings: none

CHEST: Again demonstrated nodular consolidation in the RIGHT upper
lobe on the background of extensive centrilobular emphysema. This
linear reticulonodular consolidation is not changed in in size or
imaging characteristics. One of the larger foci measure 2.5 by
cm on image [DATE]. The activity is mild SUV max equal 2.8 which
compares with 3.5 on comparison exam. More superiorly there is
thickening along the pleural surface of the superior segment RIGHT
lower lobe measuring 2.9 by 1.3 cm also unchanged in size with SUV
max equal 3.4 compared to SUV max equal 3.9. No new lesions are
present. A similar nodular thickening along the LEFT and RIGHT
oblique fissures. Mild metabolic activity also noted.

There is uptake within the medial margin of the LEFT and RIGHT
scapula unchanged from comparison exam. Metabolic activity
indeterminate but unlikely symmetric metastasis. There is a new rib
fracture laterally on the RIGHT anteriorly on image 59 of fused data
set with associated metabolic activity.

Incidental CT findings: none

ABDOMEN/PELVIS: No abnormal hypermetabolic activity within the
liver, pancreas, adrenal glands, or spleen. No hypermetabolic lymph
nodes in the abdomen or pelvis. Focus of activity in the RIGHT
perineum region suggesting a focus of infection or urine
contamination (image 180 fused data set )

Incidental CT findings: Uterus adnexa normal Atherosclerotic
calcification of the aorta.

SKELETON: No focal hypermetabolic activity to suggest skeletal
metastasis.

Incidental CT findings: none
IMPRESSION: 1. Stable angular pleuroparenchymal thickening and masslike
consolidation in the RIGHT lung with moderate metabolic activity.
Favor benign chronic inflammatory infectious process. Consider
follow-up CT in 6 months to demonstrate continued stability.
2. No evidence of metastatic adenopathy or distant metastatic
disease.
3. Potential small focus cellulitis in the RIGHT perineum versus
urine contamination.

## 2020-11-16 ENCOUNTER — Telehealth: Payer: Self-pay

## 2020-11-16 NOTE — Telephone Encounter (Signed)
Pt called in to r/s her appt as her husband had surg,    Kathleen Pace

## 2020-11-19 ENCOUNTER — Inpatient Hospital Stay: Payer: Medicare Other

## 2020-11-19 ENCOUNTER — Inpatient Hospital Stay: Payer: Medicare Other | Admitting: Hematology & Oncology

## 2020-12-13 ENCOUNTER — Encounter: Payer: Self-pay | Admitting: Hematology & Oncology

## 2020-12-13 ENCOUNTER — Inpatient Hospital Stay: Payer: Medicare Other | Admitting: Hematology & Oncology

## 2020-12-13 ENCOUNTER — Telehealth: Payer: Self-pay

## 2020-12-13 ENCOUNTER — Inpatient Hospital Stay: Payer: Medicare Other | Attending: Hematology & Oncology

## 2020-12-13 ENCOUNTER — Other Ambulatory Visit: Payer: Self-pay

## 2020-12-13 VITALS — BP 99/58 | HR 92 | Temp 98.6°F | Resp 20 | Wt 108.0 lb

## 2020-12-13 DIAGNOSIS — Z79899 Other long term (current) drug therapy: Secondary | ICD-10-CM | POA: Diagnosis not present

## 2020-12-13 DIAGNOSIS — D500 Iron deficiency anemia secondary to blood loss (chronic): Secondary | ICD-10-CM | POA: Diagnosis not present

## 2020-12-13 DIAGNOSIS — L80 Vitiligo: Secondary | ICD-10-CM | POA: Insufficient documentation

## 2020-12-13 DIAGNOSIS — M069 Rheumatoid arthritis, unspecified: Secondary | ICD-10-CM | POA: Insufficient documentation

## 2020-12-13 DIAGNOSIS — D509 Iron deficiency anemia, unspecified: Secondary | ICD-10-CM | POA: Insufficient documentation

## 2020-12-13 DIAGNOSIS — D510 Vitamin B12 deficiency anemia due to intrinsic factor deficiency: Secondary | ICD-10-CM | POA: Diagnosis not present

## 2020-12-13 DIAGNOSIS — D693 Immune thrombocytopenic purpura: Secondary | ICD-10-CM | POA: Diagnosis not present

## 2020-12-13 DIAGNOSIS — R5383 Other fatigue: Secondary | ICD-10-CM | POA: Diagnosis not present

## 2020-12-13 DIAGNOSIS — I350 Nonrheumatic aortic (valve) stenosis: Secondary | ICD-10-CM | POA: Diagnosis not present

## 2020-12-13 DIAGNOSIS — Z7982 Long term (current) use of aspirin: Secondary | ICD-10-CM | POA: Diagnosis not present

## 2020-12-13 DIAGNOSIS — R531 Weakness: Secondary | ICD-10-CM | POA: Diagnosis not present

## 2020-12-13 DIAGNOSIS — E039 Hypothyroidism, unspecified: Secondary | ICD-10-CM | POA: Insufficient documentation

## 2020-12-13 DIAGNOSIS — K909 Intestinal malabsorption, unspecified: Secondary | ICD-10-CM

## 2020-12-13 LAB — CBC WITH DIFFERENTIAL (CANCER CENTER ONLY)
Abs Immature Granulocytes: 0.01 10*3/uL (ref 0.00–0.07)
Basophils Absolute: 0 10*3/uL (ref 0.0–0.1)
Basophils Relative: 0 %
Eosinophils Absolute: 0.2 10*3/uL (ref 0.0–0.5)
Eosinophils Relative: 5 %
HCT: 32.9 % — ABNORMAL LOW (ref 36.0–46.0)
Hemoglobin: 10.8 g/dL — ABNORMAL LOW (ref 12.0–15.0)
Immature Granulocytes: 0 %
Lymphocytes Relative: 18 %
Lymphs Abs: 0.9 10*3/uL (ref 0.7–4.0)
MCH: 34.1 pg — ABNORMAL HIGH (ref 26.0–34.0)
MCHC: 32.8 g/dL (ref 30.0–36.0)
MCV: 103.8 fL — ABNORMAL HIGH (ref 80.0–100.0)
Monocytes Absolute: 0.2 10*3/uL (ref 0.1–1.0)
Monocytes Relative: 4 %
Neutro Abs: 3.5 10*3/uL (ref 1.7–7.7)
Neutrophils Relative %: 73 %
Platelet Count: 444 10*3/uL — ABNORMAL HIGH (ref 150–400)
RBC: 3.17 MIL/uL — ABNORMAL LOW (ref 3.87–5.11)
RDW: 14.7 % (ref 11.5–15.5)
WBC Count: 4.7 10*3/uL (ref 4.0–10.5)
nRBC: 0 % (ref 0.0–0.2)

## 2020-12-13 LAB — CMP (CANCER CENTER ONLY)
ALT: 33 U/L (ref 0–44)
AST: 41 U/L (ref 15–41)
Albumin: 3.5 g/dL (ref 3.5–5.0)
Alkaline Phosphatase: 270 U/L — ABNORMAL HIGH (ref 38–126)
Anion gap: 7 (ref 5–15)
BUN: 23 mg/dL (ref 8–23)
CO2: 27 mmol/L (ref 22–32)
Calcium: 8.9 mg/dL (ref 8.9–10.3)
Chloride: 100 mmol/L (ref 98–111)
Creatinine: 0.62 mg/dL (ref 0.44–1.00)
GFR, Estimated: >60 mL/min (ref >60)
Glucose, Bld: 103 mg/dL — ABNORMAL HIGH (ref 70–99)
Potassium: 4.1 mmol/L (ref 3.5–5.1)
Sodium: 134 mmol/L — ABNORMAL LOW (ref 135–145)
Total Bilirubin: 0.8 mg/dL (ref 0.3–1.2)
Total Protein: 6 g/dL — ABNORMAL LOW (ref 6.5–8.1)

## 2020-12-13 LAB — VITAMIN B12: Vitamin B-12: 350 pg/mL (ref 180–914)

## 2020-12-13 NOTE — Progress Notes (Signed)
Hematology and Oncology Follow Up Visit  Kathleen Pace [id number] 1940/11/11 80 y.o. 12/13/2020   Principle Diagnosis:  Chronic immune thrombocytopenia, remission. 2. Pernicious anemia. 3. Vitiligo. 4. Rheumatoid Arthritis 5. Iron Deficiency Anemia  Current Therapy:    Vitamin B12 1 mg IM Q. month   IV Iron as needed     Interim History:  Ms.  Pace is back for follow-up.  The big news is that she is to have an aortic valve repair.  Is otherwise she has aortic stenosis.  She is going to have this done on March 10.  Otherwise, she seems to be doing okay.  She has had no problems with nausea or vomiting.  There has been no obvious bleeding.  When we saw her back in September, her ferritin was 48 with an iron saturation of 15%.  She did not get any iron at that time because her hemoglobin was doing fine.  She has had no issues with respect to the hip fracture.  She is walking around pretty well right now.  There is been no leg swelling.  She has had no issues with neuropathy.  Her last vitamin B12 level back in September was 409.    Overall, her performance status is ECOG 1.     Medications:  Current Outpatient Medications:  .  acetaminophen (TYLENOL) 325 MG tablet, Take 650 mg by mouth every 6 (six) hours as needed., Disp: , Rfl:  .  alendronate (FOSAMAX) 70 MG tablet, Take 1 tablet po weekly.  Stay upright for 1 hour following, Disp: , Rfl:  .  amLODipine (NORVASC) 10 MG tablet, Take 10 mg by mouth daily., Disp: , Rfl:  .  aspirin 81 MG EC tablet, Take 1 tablet by mouth daily., Disp: , Rfl:  .  atorvastatin (LIPITOR) 80 MG tablet, SMARTSIG:1 Tablet(s) By Mouth Every Evening, Disp: , Rfl:  .  CALCIUM-VITAMIN D PO, Take 1 capsule by mouth every morning. Calcium 600 mg  Vitamin D 200, Disp: , Rfl:  .  Cholecalciferol (VITAMIN D) 2000 UNITS tablet, Take 2,000 Units by mouth daily., Disp: , Rfl:  .  clopidogrel (PLAVIX) 75 MG tablet, Take 75 mg by mouth daily., Disp: , Rfl:   .  cyanocobalamin (,VITAMIN B-12,) 1000 MCG/ML injection, INJECT 1 ML ONCE MONTHLY, Disp: 3 mL, Rfl: 24 .  cycloSPORINE (RESTASIS) 0.05 % ophthalmic emulsion, Place 1 drop into both eyes 2 times daily., Disp: , Rfl:  .  esomeprazole (NEXIUM) 20 MG capsule, Take 20 mg by mouth daily at 12 noon., Disp: , Rfl:  .  furosemide (LASIX) 20 MG tablet, Take 20 mg by mouth daily., Disp: , Rfl:  .  hydroxychloroquine (PLAQUENIL) 200 MG tablet, 200 mg daily. , Disp: , Rfl:  .  leflunomide (ARAVA) 10 MG tablet, Take 10 mg by mouth daily. Take 1 and 1/2 tablet total of 15 mg daily., Disp: , Rfl:  .  levothyroxine (SYNTHROID, LEVOTHROID) 125 MCG tablet, 125 mcg daily. 08/31/2019 One day each week, will increase to 1.5 tabs., Disp: , Rfl:  .  metoprolol succinate (TOPROL-XL) 25 MG 24 hr tablet, Take 25 mg by mouth daily., Disp: , Rfl:  .  prednisoLONE acetate (PRED FORTE) 1 % ophthalmic suspension, Place 1 drop into both eyes daily., Disp: , Rfl:  .  predniSONE (DELTASONE) 20 MG tablet, Take 20 mg by mouth daily. For 3 days, started 12/13/20., Disp: , Rfl:  .  sertraline (ZOLOFT) 50 MG tablet, Take 50 mg by mouth  daily., Disp: , Rfl:  .  umeclidinium-vilanterol (ANORO ELLIPTA) 62.5-25 MCG/INH AEPB, Inhale into the lungs daily. , Disp: , Rfl:  .  valsartan (DIOVAN) 320 MG tablet, Take by mouth daily. , Disp: , Rfl:  .  albuterol (PROVENTIL HFA;VENTOLIN HFA) 108 (90 Base) MCG/ACT inhaler, Inhale into the lungs. (Patient not taking: No sig reported), Disp: , Rfl:  .  Omega-3 Fatty Acids (OMEGA-3 FISH OIL PO), Take by mouth. (Patient not taking: Reported on 12/13/2020), Disp: , Rfl:   Allergies:  Allergies  Allergen Reactions  . Iodinated Diagnostic Agents Swelling  . Sulfa Antibiotics Other (See Comments)    Drops Blood Pressure  . Doxycycline Nausea Only  . Penicillins Rash  . Quinine Derivatives Other (See Comments)    Patient doesn't remember reaction    Past Medical History, Surgical history, Social  history, and Family History were reviewed and updated.  Review of Systems: Review of Systems  Constitutional: Positive for malaise/fatigue.  HENT: Positive for congestion.   Eyes: Negative.   Respiratory: Positive for cough.   Cardiovascular: Positive for palpitations.  Gastrointestinal: Positive for diarrhea and nausea.  Genitourinary: Negative.   Musculoskeletal: Positive for joint pain and myalgias.  Skin: Negative.   Neurological: Negative.   Endo/Heme/Allergies: Negative.   Psychiatric/Behavioral: Negative.      Physical Exam:  weight is 108 lb (49 kg). Her oral temperature is 98.6 F (37 C). Her blood pressure is 99/58 (abnormal) and her pulse is 92. Her respiration is 20 and oxygen saturation is 99%.   Physical Exam Vitals reviewed.  HENT:     Head: Normocephalic and atraumatic.  Eyes:     Pupils: Pupils are equal, round, and reactive to light.  Cardiovascular:     Rate and Rhythm: Normal rate and regular rhythm.     Heart sounds: Normal heart sounds.  Pulmonary:     Effort: Pulmonary effort is normal.     Breath sounds: Normal breath sounds.  Abdominal:     General: Bowel sounds are normal.     Palpations: Abdomen is soft.  Musculoskeletal:        General: No tenderness or deformity. Normal range of motion.     Cervical back: Normal range of motion.  Lymphadenopathy:     Cervical: No cervical adenopathy.  Skin:    General: Skin is warm and dry.     Findings: No erythema or rash.  Neurological:     Mental Status: She is alert and oriented to person, place, and time.  Psychiatric:        Behavior: Behavior normal.        Thought Content: Thought content normal.        Judgment: Judgment normal.      Lab Results  Component Value Date   WBC 4.7 12/13/2020   HGB 10.8 (L) 12/13/2020   HCT 32.9 (L) 12/13/2020   MCV 103.8 (H) 12/13/2020   PLT 444 (H) 12/13/2020     Chemistry      Component Value Date/Time   NA 134 (L) 12/13/2020 1402   NA 144  09/11/2017 1335   NA 138 07/31/2016 0947   K 4.1 12/13/2020 1402   K 4.2 09/11/2017 1335   K 4.3 07/31/2016 0947   CL 100 12/13/2020 1402   CL 104 09/11/2017 1335   CO2 27 12/13/2020 1402   CO2 28 09/11/2017 1335   CO2 27 07/31/2016 0947   BUN 23 12/13/2020 1402   BUN 17 09/11/2017 1335  BUN 14.7 07/31/2016 0947   CREATININE 0.62 12/13/2020 1402   CREATININE 0.9 09/11/2017 1335   CREATININE 0.7 07/31/2016 0947      Component Value Date/Time   CALCIUM 8.9 12/13/2020 1402   CALCIUM 9.0 09/11/2017 1335   CALCIUM 9.2 07/31/2016 0947   ALKPHOS 270 (H) 12/13/2020 1402   ALKPHOS 102 (H) 09/11/2017 1335   ALKPHOS 70 07/31/2016 0947   AST 41 12/13/2020 1402   AST 18 07/31/2016 0947   ALT 33 12/13/2020 1402   ALT 20 09/11/2017 1335   ALT 9 07/31/2016 0947   BILITOT 0.8 12/13/2020 1402   BILITOT 0.62 07/31/2016 0947      Impression and Plan: Ms. Scarber is 80 year old white female. She has a clear autoimmune issue. She has pernicious anemia. She has vitiligo. She has hypo-thyroidism.  She is on Plaquenil.  We will see what her iron studies show.  It would not surprise me if they were on the low side.  Her platelet count is elevated.  She looks a little bit more anemic right now.  I would like to see her back in 6 weeks.  I want to make sure we see her back little more often right now because of the aortic valve issue.    Volanda Napoleon, MD 2/24/20223:29 PM

## 2020-12-13 NOTE — Telephone Encounter (Signed)
appts made and printed for pt per 12/13/20 los    Kathleen Pace 

## 2020-12-14 LAB — FERRITIN: Ferritin: 77 ng/mL (ref 11–307)

## 2020-12-14 LAB — IRON AND TIBC
Iron: 25 ug/dL — ABNORMAL LOW (ref 41–142)
Saturation Ratios: 8 % — ABNORMAL LOW (ref 21–57)
TIBC: 306 ug/dL (ref 236–444)
UIBC: 281 ug/dL (ref 120–384)

## 2020-12-17 ENCOUNTER — Other Ambulatory Visit: Payer: Self-pay | Admitting: Family

## 2020-12-19 ENCOUNTER — Other Ambulatory Visit: Payer: Self-pay

## 2020-12-19 ENCOUNTER — Inpatient Hospital Stay: Payer: Medicare Other | Attending: Hematology & Oncology

## 2020-12-19 VITALS — BP 135/50 | HR 53 | Temp 97.8°F | Resp 18

## 2020-12-19 DIAGNOSIS — Z79899 Other long term (current) drug therapy: Secondary | ICD-10-CM | POA: Insufficient documentation

## 2020-12-19 DIAGNOSIS — D693 Immune thrombocytopenic purpura: Secondary | ICD-10-CM | POA: Insufficient documentation

## 2020-12-19 DIAGNOSIS — D500 Iron deficiency anemia secondary to blood loss (chronic): Secondary | ICD-10-CM

## 2020-12-19 MED ORDER — SODIUM CHLORIDE 0.9 % IV SOLN
125.0000 mg | Freq: Once | INTRAVENOUS | Status: AC
  Administered 2020-12-19: 125 mg via INTRAVENOUS
  Filled 2020-12-19: qty 10

## 2020-12-19 MED ORDER — SODIUM CHLORIDE 0.9 % IV SOLN
Freq: Once | INTRAVENOUS | Status: AC
  Filled 2020-12-19: qty 250

## 2020-12-19 NOTE — Patient Instructions (Signed)
Sodium Ferric Gluconate Complex injection What is this medicine? SODIUM FERRIC GLUCONATE COMPLEX (SOE dee um FER ik GLOO koe nate KOM pleks) is an iron replacement. It is used with epoetin therapy to treat low iron levels in patients who are receiving hemodialysis. This medicine may be used for other purposes; ask your health care provider or pharmacist if you have questions. COMMON BRAND NAME(S): Ferrlecit, Nulecit What should I tell my health care provider before I take this medicine? They need to know if you have any of the following conditions:  anemia that is not from iron deficiency  high levels of iron in the body  an unusual or allergic reaction to iron, benzyl alcohol, other medicines, foods, dyes, or preservatives  pregnant or are trying to become pregnant  breast-feeding How should I use this medicine? This medicine is for infusion into a vein. It is given by a health care professional in a hospital or clinic setting. Talk to your pediatrician regarding the use of this medicine in children. While this drug may be prescribed for children as young as 6 years old for selected conditions, precautions do apply. Overdosage: If you think you have taken too much of this medicine contact a poison control center or emergency room at once. NOTE: This medicine is only for you. Do not share this medicine with others. What if I miss a dose? It is important not to miss your dose. Call your doctor or health care professional if you are unable to keep an appointment. What may interact with this medicine? Do not take this medicine with any of the following medications:  deferoxamine  dimercaprol  other iron products This medicine may also interact with the following medications:  chloramphenicol  deferasirox  medicine for blood pressure like enalapril This list may not describe all possible interactions. Give your health care provider a list of all the medicines, herbs,  non-prescription drugs, or dietary supplements you use. Also tell them if you smoke, drink alcohol, or use illegal drugs. Some items may interact with your medicine. What should I watch for while using this medicine? Your condition will be monitored carefully while you are receiving this medicine. Visit your doctor for check-ups as directed. What side effects may I notice from receiving this medicine? Side effects that you should report to your doctor or health care professional as soon as possible:  allergic reactions like skin rash, itching or hives, swelling of the face, lips, or tongue  breathing problems  changes in hearing  changes in vision  chills, flushing, or sweating  fast, irregular heartbeat  feeling faint or lightheaded, falls  fever, flu-like symptoms  high or low blood pressure  pain, tingling, numbness in the hands or feet  severe pain in the chest, back, flanks, or groin  swelling of the ankles, feet, hands  trouble passing urine or change in the amount of urine  unusually weak or tired Side effects that usually do not require medical attention (report to your doctor or health care professional if they continue or are bothersome):  cramps  dark colored stools  diarrhea  headache  nausea, vomiting  stomach upset This list may not describe all possible side effects. Call your doctor for medical advice about side effects. You may report side effects to FDA at 1-800-FDA-1088. Where should I keep my medicine? This drug is given in a hospital or clinic and will not be stored at home. NOTE: This sheet is a summary. It may not cover all   possible information. If you have questions about this medicine, talk to your doctor, pharmacist, or health care provider.  2021 Elsevier/Gold Standard (2008-06-07 15:58:57)   

## 2020-12-24 ENCOUNTER — Other Ambulatory Visit: Payer: Self-pay | Admitting: Family

## 2020-12-25 ENCOUNTER — Other Ambulatory Visit: Payer: Self-pay

## 2020-12-25 ENCOUNTER — Inpatient Hospital Stay: Payer: Medicare Other

## 2020-12-25 VITALS — BP 136/56 | HR 70 | Temp 97.9°F | Resp 18

## 2020-12-25 DIAGNOSIS — D500 Iron deficiency anemia secondary to blood loss (chronic): Secondary | ICD-10-CM

## 2020-12-25 DIAGNOSIS — D693 Immune thrombocytopenic purpura: Secondary | ICD-10-CM | POA: Diagnosis not present

## 2020-12-25 MED ORDER — SODIUM CHLORIDE 0.9 % IV SOLN
Freq: Once | INTRAVENOUS | Status: AC
  Filled 2020-12-25: qty 250

## 2020-12-25 MED ORDER — METHYLPREDNISOLONE SODIUM SUCC 125 MG IJ SOLR
60.0000 mg | Freq: Once | INTRAMUSCULAR | Status: AC
  Administered 2020-12-25: 60 mg via INTRAVENOUS

## 2020-12-25 MED ORDER — SODIUM CHLORIDE 0.9 % IV SOLN
125.0000 mg | Freq: Once | INTRAVENOUS | Status: AC
  Administered 2020-12-25: 125 mg via INTRAVENOUS
  Filled 2020-12-25: qty 10

## 2020-12-25 MED ORDER — FAMOTIDINE IN NACL 20-0.9 MG/50ML-% IV SOLN
20.0000 mg | Freq: Once | INTRAVENOUS | Status: AC
  Administered 2020-12-25: 20 mg via INTRAVENOUS

## 2020-12-25 MED ORDER — FAMOTIDINE IN NACL 20-0.9 MG/50ML-% IV SOLN
INTRAVENOUS | Status: AC
  Filled 2020-12-25: qty 50

## 2020-12-25 MED ORDER — METHYLPREDNISOLONE SODIUM SUCC 125 MG IJ SOLR
INTRAMUSCULAR | Status: AC
  Filled 2020-12-25: qty 2

## 2020-12-25 NOTE — Patient Instructions (Signed)

## 2020-12-27 ENCOUNTER — Ambulatory Visit: Payer: Medicare Other

## 2021-01-24 ENCOUNTER — Telehealth: Payer: Self-pay | Admitting: *Deleted

## 2021-01-24 ENCOUNTER — Inpatient Hospital Stay: Payer: Medicare Other | Attending: Hematology & Oncology

## 2021-01-24 ENCOUNTER — Encounter: Payer: Self-pay | Admitting: Hematology & Oncology

## 2021-01-24 ENCOUNTER — Other Ambulatory Visit: Payer: Self-pay

## 2021-01-24 ENCOUNTER — Inpatient Hospital Stay (HOSPITAL_BASED_OUTPATIENT_CLINIC_OR_DEPARTMENT_OTHER): Payer: Medicare Other | Admitting: Hematology & Oncology

## 2021-01-24 VITALS — BP 126/56 | HR 84 | Temp 98.5°F | Resp 18 | Wt 106.0 lb

## 2021-01-24 DIAGNOSIS — L80 Vitiligo: Secondary | ICD-10-CM | POA: Diagnosis not present

## 2021-01-24 DIAGNOSIS — M069 Rheumatoid arthritis, unspecified: Secondary | ICD-10-CM | POA: Diagnosis not present

## 2021-01-24 DIAGNOSIS — D510 Vitamin B12 deficiency anemia due to intrinsic factor deficiency: Secondary | ICD-10-CM

## 2021-01-24 DIAGNOSIS — D693 Immune thrombocytopenic purpura: Secondary | ICD-10-CM | POA: Insufficient documentation

## 2021-01-24 DIAGNOSIS — R531 Weakness: Secondary | ICD-10-CM | POA: Insufficient documentation

## 2021-01-24 DIAGNOSIS — Z79899 Other long term (current) drug therapy: Secondary | ICD-10-CM | POA: Diagnosis not present

## 2021-01-24 DIAGNOSIS — R5383 Other fatigue: Secondary | ICD-10-CM | POA: Insufficient documentation

## 2021-01-24 DIAGNOSIS — D500 Iron deficiency anemia secondary to blood loss (chronic): Secondary | ICD-10-CM

## 2021-01-24 DIAGNOSIS — D509 Iron deficiency anemia, unspecified: Secondary | ICD-10-CM | POA: Insufficient documentation

## 2021-01-24 LAB — CBC WITH DIFFERENTIAL (CANCER CENTER ONLY)
Abs Immature Granulocytes: 0.01 10*3/uL (ref 0.00–0.07)
Basophils Absolute: 0.1 10*3/uL (ref 0.0–0.1)
Basophils Relative: 2 %
Eosinophils Absolute: 0.2 10*3/uL (ref 0.0–0.5)
Eosinophils Relative: 4 %
HCT: 27.1 % — ABNORMAL LOW (ref 36.0–46.0)
Hemoglobin: 8.5 g/dL — ABNORMAL LOW (ref 12.0–15.0)
Immature Granulocytes: 0 %
Lymphocytes Relative: 31 %
Lymphs Abs: 1.9 10*3/uL (ref 0.7–4.0)
MCH: 31.5 pg (ref 26.0–34.0)
MCHC: 31.4 g/dL (ref 30.0–36.0)
MCV: 100.4 fL — ABNORMAL HIGH (ref 80.0–100.0)
Monocytes Absolute: 1.1 10*3/uL — ABNORMAL HIGH (ref 0.1–1.0)
Monocytes Relative: 18 %
Neutro Abs: 2.8 10*3/uL (ref 1.7–7.7)
Neutrophils Relative %: 45 %
Platelet Count: 492 10*3/uL — ABNORMAL HIGH (ref 150–400)
RBC: 2.7 MIL/uL — ABNORMAL LOW (ref 3.87–5.11)
RDW: 15 % (ref 11.5–15.5)
WBC Count: 6.2 10*3/uL (ref 4.0–10.5)
nRBC: 0 % (ref 0.0–0.2)

## 2021-01-24 LAB — FERRITIN: Ferritin: 55 ng/mL (ref 11–307)

## 2021-01-24 LAB — CMP (CANCER CENTER ONLY)
ALT: 18 U/L (ref 0–44)
AST: 30 U/L (ref 15–41)
Albumin: 3.7 g/dL (ref 3.5–5.0)
Alkaline Phosphatase: 179 U/L — ABNORMAL HIGH (ref 38–126)
Anion gap: 7 (ref 5–15)
BUN: 19 mg/dL (ref 8–23)
CO2: 27 mmol/L (ref 22–32)
Calcium: 9.3 mg/dL (ref 8.9–10.3)
Chloride: 103 mmol/L (ref 98–111)
Creatinine: 0.57 mg/dL (ref 0.44–1.00)
GFR, Estimated: >60 mL/min (ref >60)
Glucose, Bld: 91 mg/dL (ref 70–99)
Potassium: 4.5 mmol/L (ref 3.5–5.1)
Sodium: 137 mmol/L (ref 135–145)
Total Bilirubin: 0.5 mg/dL (ref 0.3–1.2)
Total Protein: 6.4 g/dL — ABNORMAL LOW (ref 6.5–8.1)

## 2021-01-24 LAB — IRON AND TIBC
Iron: 19 ug/dL — ABNORMAL LOW (ref 41–142)
Saturation Ratios: 6 % — ABNORMAL LOW (ref 21–57)
TIBC: 353 ug/dL (ref 236–444)
UIBC: 333 ug/dL (ref 120–384)

## 2021-01-24 LAB — LACTATE DEHYDROGENASE: LDH: 269 U/L — ABNORMAL HIGH (ref 98–192)

## 2021-01-24 MED ORDER — CYANOCOBALAMIN 1000 MCG/ML IJ SOLN: INTRAMUSCULAR | 24 refills | Status: DC

## 2021-01-24 NOTE — Telephone Encounter (Signed)
-----   Message from Kathleen Napoleon, MD sent at 01/24/2021  1:10 PM EDT ----- Call - the iron is very low!!  She needs 2-3 iron infusion.   Please set up.  [name]

## 2021-01-24 NOTE — Progress Notes (Signed)
Hematology and Oncology Follow Up Visit  Kathleen Pace [id number] 03/06/1941 80 y.o. 01/24/2021   Principle Diagnosis:  Chronic immune thrombocytopenia, remission. 2. Pernicious anemia. 3. Vitiligo. 4. Rheumatoid Arthritis 5. Iron Deficiency Anemia  Current Therapy:    Vitamin B12 1 mg IM Q. month   IV Iron as needed     Interim History:  Ms.  Kathleen Pace is back for follow-up.  She underwent the TAVR procedure for her aortic valve.  This was done on 01/10/2021.  She was in the hospital overnight.  This was done at [facility].  She really did well.  She is feeling okay.  She is a little bit more anemic.  Her hemoglobin is 8.5.  This bothers me a little bit.  We will have to see what her iron studies show.  She has had no obvious bleeding.  She has not on anticoagulation outside of aspirin and Plavix.  She has had no issues with nausea or vomiting.  There is been no rashes.  Overall, her performance status is ECOG 1.    Medications:  Current Outpatient Medications:  .  cephALEXin (KEFLEX) 500 MG capsule, Take 500 mg by mouth as needed., Disp: , Rfl:  .  acetaminophen (TYLENOL) 325 MG tablet, Take 650 mg by mouth every 6 (six) hours as needed., Disp: , Rfl:  .  albuterol (PROVENTIL HFA;VENTOLIN HFA) 108 (90 Base) MCG/ACT inhaler, Inhale into the lungs. (Patient not taking: No sig reported), Disp: , Rfl:  .  alendronate (FOSAMAX) 70 MG tablet, Take 1 tablet po weekly.  Stay upright for 1 hour following, Disp: , Rfl:  .  amLODipine (NORVASC) 10 MG tablet, Take 10 mg by mouth daily., Disp: , Rfl:  .  aspirin 81 MG EC tablet, Take 1 tablet by mouth daily., Disp: , Rfl:  .  atorvastatin (LIPITOR) 80 MG tablet, SMARTSIG:1 Tablet(s) By Mouth Every Evening, Disp: , Rfl:  .  CALCIUM-VITAMIN D PO, Take 1 capsule by mouth every morning. Calcium 600 mg  Vitamin D 200, Disp: , Rfl:  .  Cholecalciferol (VITAMIN D) 2000 UNITS tablet, Take 2,000 Units by mouth daily., Disp: , Rfl:  .   clopidogrel (PLAVIX) 75 MG tablet, Take 75 mg by mouth daily., Disp: , Rfl:  .  cyanocobalamin (,VITAMIN B-12,) 1000 MCG/ML injection, INJECT 1 ML ONCE MONTHLY, Disp: 3 mL, Rfl: 24 .  cycloSPORINE (RESTASIS) 0.05 % ophthalmic emulsion, Place 1 drop into both eyes 2 times daily., Disp: , Rfl:  .  esomeprazole (NEXIUM) 20 MG capsule, Take 20 mg by mouth daily at 12 noon., Disp: , Rfl:  .  furosemide (LASIX) 20 MG tablet, Take 20 mg by mouth daily., Disp: , Rfl:  .  hydroxychloroquine (PLAQUENIL) 200 MG tablet, 200 mg daily. , Disp: , Rfl:  .  leflunomide (ARAVA) 10 MG tablet, Take 10 mg by mouth daily. Take 1 and 1/2 tablet total of 15 mg daily., Disp: , Rfl:  .  levothyroxine (SYNTHROID, LEVOTHROID) 125 MCG tablet, 125 mcg daily. 08/31/2019 One day each week, will increase to 1.5 tabs., Disp: , Rfl:  .  metoprolol succinate (TOPROL-XL) 25 MG 24 hr tablet, Take 25 mg by mouth daily., Disp: , Rfl:  .  Omega-3 Fatty Acids (OMEGA-3 FISH OIL PO), Take by mouth. (Patient not taking: Reported on 12/13/2020), Disp: , Rfl:  .  prednisoLONE acetate (PRED FORTE) 1 % ophthalmic suspension, Place 1 drop into both eyes daily., Disp: , Rfl:  .  predniSONE (DELTASONE) 20  MG tablet, Take 20 mg by mouth daily. For 3 days, started 12/13/20., Disp: , Rfl:  .  sertraline (ZOLOFT) 50 MG tablet, Take 50 mg by mouth daily., Disp: , Rfl:  .  umeclidinium-vilanterol (ANORO ELLIPTA) 62.5-25 MCG/INH AEPB, Inhale into the lungs daily. , Disp: , Rfl:  .  valsartan (DIOVAN) 320 MG tablet, Take by mouth daily. , Disp: , Rfl:   Allergies:  Allergies  Allergen Reactions  . Iodinated Diagnostic Agents Swelling  . Sulfa Antibiotics Other (See Comments)    Drops Blood Pressure  . Spironolactone Rash    Rash with itching unresponsive to benadryl.   . Doxycycline Nausea Only  . Penicillins Rash    Patient tolerated cefazolin on 01/10/21  . Quinine Derivatives Other (See Comments)    Patient doesn't remember reaction    Past  Medical History, Surgical history, Social history, and Family History were reviewed and updated.  Review of Systems: Review of Systems  Constitutional: Positive for malaise/fatigue.  HENT: Positive for congestion.   Eyes: Negative.   Respiratory: Positive for cough.   Cardiovascular: Positive for palpitations.  Gastrointestinal: Positive for diarrhea and nausea.  Genitourinary: Negative.   Musculoskeletal: Positive for joint pain and myalgias.  Skin: Negative.   Neurological: Negative.   Endo/Heme/Allergies: Negative.   Psychiatric/Behavioral: Negative.      Physical Exam:  vitals were not taken for this visit.   Physical Exam Vitals reviewed.  HENT:     Head: Normocephalic and atraumatic.  Eyes:     Pupils: Pupils are equal, round, and reactive to light.  Cardiovascular:     Rate and Rhythm: Normal rate and regular rhythm.     Heart sounds: Normal heart sounds.  Pulmonary:     Effort: Pulmonary effort is normal.     Breath sounds: Normal breath sounds.  Abdominal:     General: Bowel sounds are normal.     Palpations: Abdomen is soft.  Musculoskeletal:        General: No tenderness or deformity. Normal range of motion.     Cervical back: Normal range of motion.  Lymphadenopathy:     Cervical: No cervical adenopathy.  Skin:    General: Skin is warm and dry.     Findings: No erythema or rash.  Neurological:     Mental Status: She is alert and oriented to person, place, and time.  Psychiatric:        Behavior: Behavior normal.        Thought Content: Thought content normal.        Judgment: Judgment normal.      Lab Results  Component Value Date   WBC 6.2 01/24/2021   HGB 8.5 (L) 01/24/2021   HCT 27.1 (L) 01/24/2021   MCV 100.4 (H) 01/24/2021   PLT 492 (H) 01/24/2021     Chemistry      Component Value Date/Time   NA 137 01/24/2021 0950   NA 144 09/11/2017 1335   NA 138 07/31/2016 0947   K 4.5 01/24/2021 0950   K 4.2 09/11/2017 1335   K 4.3  07/31/2016 0947   CL 103 01/24/2021 0950   CL 104 09/11/2017 1335   CO2 27 01/24/2021 0950   CO2 28 09/11/2017 1335   CO2 27 07/31/2016 0947   BUN 19 01/24/2021 0950   BUN 17 09/11/2017 1335   BUN 14.7 07/31/2016 0947   CREATININE 0.57 01/24/2021 0950   CREATININE 0.9 09/11/2017 1335   CREATININE 0.7 07/31/2016 0947  Component Value Date/Time   CALCIUM 9.3 01/24/2021 0950   CALCIUM 9.0 09/11/2017 1335   CALCIUM 9.2 07/31/2016 0947   ALKPHOS 179 (H) 01/24/2021 0950   ALKPHOS 102 (H) 09/11/2017 1335   ALKPHOS 70 07/31/2016 0947   AST 30 01/24/2021 0950   AST 18 07/31/2016 0947   ALT 18 01/24/2021 0950   ALT 20 09/11/2017 1335   ALT 9 07/31/2016 0947   BILITOT 0.5 01/24/2021 0950   BILITOT 0.62 07/31/2016 0947      Impression and Plan: Ms. Hagenow is 80 year old white female. She has a clear autoimmune issue. She has pernicious anemia. She has vitiligo. She has hypo-thyroidism.  She is on Plaquenil.  Again, we will have to see what her iron studies show.  I am glad that she had the TAVR procedure.  Her quality of life is definitely better.  We will plan to get her back to see [place] in another month or so.  If we have to give her iron, we will have her come back sooner.    Volanda Napoleon, MD 4/7/202210:34 AM

## 2021-01-24 NOTE — Telephone Encounter (Signed)
Unable to reach pt. lmovm for pt to expect a call from scheduling regarding upcoming iron infusions

## 2021-01-24 NOTE — Addendum Note (Signed)
Addended by: Burney Gauze R on: 01/24/2021 11:13 AM   Modules accepted: Orders

## 2021-01-25 ENCOUNTER — Telehealth: Payer: Self-pay

## 2021-01-25 NOTE — Telephone Encounter (Signed)
Called pt and she is aware of her iron iron tx appts   Keniel Ralston

## 2021-01-28 ENCOUNTER — Telehealth: Payer: Self-pay | Admitting: Hematology & Oncology

## 2021-01-28 ENCOUNTER — Inpatient Hospital Stay: Payer: Medicare Other

## 2021-01-28 NOTE — Telephone Encounter (Signed)
Appointments scheduled calendar printed & mailed per 4/7 los 

## 2021-02-01 ENCOUNTER — Inpatient Hospital Stay: Payer: Medicare Other

## 2021-02-01 ENCOUNTER — Other Ambulatory Visit: Payer: Self-pay

## 2021-02-01 VITALS — BP 188/78 | HR 67 | Temp 97.9°F | Resp 17

## 2021-02-01 DIAGNOSIS — D693 Immune thrombocytopenic purpura: Secondary | ICD-10-CM | POA: Diagnosis not present

## 2021-02-01 DIAGNOSIS — D500 Iron deficiency anemia secondary to blood loss (chronic): Secondary | ICD-10-CM

## 2021-02-01 MED ORDER — METHYLPREDNISOLONE SODIUM SUCC 125 MG IJ SOLR
INTRAMUSCULAR | Status: AC
  Filled 2021-02-01: qty 2

## 2021-02-01 MED ORDER — METHYLPREDNISOLONE SODIUM SUCC 125 MG IJ SOLR
60.0000 mg | Freq: Once | INTRAMUSCULAR | Status: AC
  Administered 2021-02-01: 60 mg via INTRAVENOUS

## 2021-02-01 MED ORDER — SODIUM CHLORIDE 0.9 % IV SOLN
Freq: Once | INTRAVENOUS | Status: AC
  Filled 2021-02-01: qty 250

## 2021-02-01 MED ORDER — SODIUM CHLORIDE 0.9 % IV SOLN
125.0000 mg | Freq: Once | INTRAVENOUS | Status: AC
  Administered 2021-02-01: 125 mg via INTRAVENOUS
  Filled 2021-02-01: qty 10

## 2021-02-01 MED ORDER — FAMOTIDINE IN NACL 20-0.9 MG/50ML-% IV SOLN
20.0000 mg | Freq: Once | INTRAVENOUS | Status: AC
  Administered 2021-02-01: 20 mg via INTRAVENOUS

## 2021-02-01 MED ORDER — FAMOTIDINE IN NACL 20-0.9 MG/50ML-% IV SOLN
INTRAVENOUS | Status: AC
  Filled 2021-02-01: qty 50

## 2021-02-01 NOTE — Patient Instructions (Signed)
Sodium Ferric Gluconate Complex injection What is this medicine? SODIUM FERRIC GLUCONATE COMPLEX (SOE dee um FER ik GLOO koe nate KOM pleks) is an iron replacement. It is used with epoetin therapy to treat low iron levels in patients who are receiving hemodialysis. This medicine may be used for other purposes; ask your health care provider or pharmacist if you have questions. COMMON BRAND NAME(S): Ferrlecit, Nulecit What should I tell my health care provider before I take this medicine? They need to know if you have any of the following conditions:  anemia that is not from iron deficiency  high levels of iron in the body  an unusual or allergic reaction to iron, benzyl alcohol, other medicines, foods, dyes, or preservatives  pregnant or are trying to become pregnant  breast-feeding How should I use this medicine? This medicine is for infusion into a vein. It is given by a health care professional in a hospital or clinic setting. Talk to your pediatrician regarding the use of this medicine in children. While this drug may be prescribed for children as young as 6 years old for selected conditions, precautions do apply. Overdosage: If you think you have taken too much of this medicine contact a poison control center or emergency room at once. NOTE: This medicine is only for you. Do not share this medicine with others. What if I miss a dose? It is important not to miss your dose. Call your doctor or health care professional if you are unable to keep an appointment. What may interact with this medicine? Do not take this medicine with any of the following medications:  deferoxamine  dimercaprol  other iron products This medicine may also interact with the following medications:  chloramphenicol  deferasirox  medicine for blood pressure like enalapril This list may not describe all possible interactions. Give your health care provider a list of all the medicines, herbs,  non-prescription drugs, or dietary supplements you use. Also tell them if you smoke, drink alcohol, or use illegal drugs. Some items may interact with your medicine. What should I watch for while using this medicine? Your condition will be monitored carefully while you are receiving this medicine. Visit your doctor for check-ups as directed. What side effects may I notice from receiving this medicine? Side effects that you should report to your doctor or health care professional as soon as possible:  allergic reactions like skin rash, itching or hives, swelling of the face, lips, or tongue  breathing problems  changes in hearing  changes in vision  chills, flushing, or sweating  fast, irregular heartbeat  feeling faint or lightheaded, falls  fever, flu-like symptoms  high or low blood pressure  pain, tingling, numbness in the hands or feet  severe pain in the chest, back, flanks, or groin  swelling of the ankles, feet, hands  trouble passing urine or change in the amount of urine  unusually weak or tired Side effects that usually do not require medical attention (report to your doctor or health care professional if they continue or are bothersome):  cramps  dark colored stools  diarrhea  headache  nausea, vomiting  stomach upset This list may not describe all possible side effects. Call your doctor for medical advice about side effects. You may report side effects to FDA at 1-800-FDA-1088. Where should I keep my medicine? This drug is given in a hospital or clinic and will not be stored at home. NOTE: This sheet is a summary. It may not cover all   possible information. If you have questions about this medicine, talk to your doctor, pharmacist, or health care provider.  2021 Elsevier/Gold Standard (2008-06-07 15:58:57)   

## 2021-02-07 ENCOUNTER — Other Ambulatory Visit: Payer: Self-pay

## 2021-02-07 ENCOUNTER — Inpatient Hospital Stay: Payer: Medicare Other

## 2021-02-07 VITALS — BP 124/45 | HR 70 | Temp 97.4°F | Resp 16

## 2021-02-07 DIAGNOSIS — D693 Immune thrombocytopenic purpura: Secondary | ICD-10-CM | POA: Diagnosis not present

## 2021-02-07 DIAGNOSIS — D500 Iron deficiency anemia secondary to blood loss (chronic): Secondary | ICD-10-CM

## 2021-02-07 MED ORDER — METHYLPREDNISOLONE SODIUM SUCC 125 MG IJ SOLR
INTRAMUSCULAR | Status: AC
  Filled 2021-02-07: qty 2

## 2021-02-07 MED ORDER — SODIUM CHLORIDE 0.9 % IV SOLN
125.0000 mg | Freq: Once | INTRAVENOUS | Status: AC
  Administered 2021-02-07: 125 mg via INTRAVENOUS
  Filled 2021-02-07: qty 10

## 2021-02-07 MED ORDER — METHYLPREDNISOLONE SODIUM SUCC 125 MG IJ SOLR
60.0000 mg | Freq: Once | INTRAMUSCULAR | Status: AC
  Administered 2021-02-07: 60 mg via INTRAVENOUS

## 2021-02-07 MED ORDER — FAMOTIDINE IN NACL 20-0.9 MG/50ML-% IV SOLN
INTRAVENOUS | Status: AC
  Filled 2021-02-07: qty 50

## 2021-02-07 MED ORDER — SODIUM CHLORIDE 0.9 % IV SOLN
Freq: Once | INTRAVENOUS | Status: AC
  Filled 2021-02-07: qty 250

## 2021-02-07 MED ORDER — FAMOTIDINE IN NACL 20-0.9 MG/50ML-% IV SOLN
20.0000 mg | Freq: Once | INTRAVENOUS | Status: AC
  Administered 2021-02-07: 20 mg via INTRAVENOUS

## 2021-02-07 NOTE — Patient Instructions (Signed)
Sodium Ferric Gluconate Complex injection What is this medicine? SODIUM FERRIC GLUCONATE COMPLEX (SOE dee um FER ik GLOO koe nate KOM pleks) is an iron replacement. It is used with epoetin therapy to treat low iron levels in patients who are receiving hemodialysis. This medicine may be used for other purposes; ask your health care provider or pharmacist if you have questions. COMMON BRAND NAME(S): Ferrlecit, Nulecit What should I tell my health care provider before I take this medicine? They need to know if you have any of the following conditions:  anemia that is not from iron deficiency  high levels of iron in the body  an unusual or allergic reaction to iron, benzyl alcohol, other medicines, foods, dyes, or preservatives  pregnant or are trying to become pregnant  breast-feeding How should I use this medicine? This medicine is for infusion into a vein. It is given by a health care professional in a hospital or clinic setting. Talk to your pediatrician regarding the use of this medicine in children. While this drug may be prescribed for children as young as 6 years old for selected conditions, precautions do apply. Overdosage: If you think you have taken too much of this medicine contact a poison control center or emergency room at once. NOTE: This medicine is only for you. Do not share this medicine with others. What if I miss a dose? It is important not to miss your dose. Call your doctor or health care professional if you are unable to keep an appointment. What may interact with this medicine? Do not take this medicine with any of the following medications:  deferoxamine  dimercaprol  other iron products This medicine may also interact with the following medications:  chloramphenicol  deferasirox  medicine for blood pressure like enalapril This list may not describe all possible interactions. Give your health care provider a list of all the medicines, herbs,  non-prescription drugs, or dietary supplements you use. Also tell them if you smoke, drink alcohol, or use illegal drugs. Some items may interact with your medicine. What should I watch for while using this medicine? Your condition will be monitored carefully while you are receiving this medicine. Visit your doctor for check-ups as directed. What side effects may I notice from receiving this medicine? Side effects that you should report to your doctor or health care professional as soon as possible:  allergic reactions like skin rash, itching or hives, swelling of the face, lips, or tongue  breathing problems  changes in hearing  changes in vision  chills, flushing, or sweating  fast, irregular heartbeat  feeling faint or lightheaded, falls  fever, flu-like symptoms  high or low blood pressure  pain, tingling, numbness in the hands or feet  severe pain in the chest, back, flanks, or groin  swelling of the ankles, feet, hands  trouble passing urine or change in the amount of urine  unusually weak or tired Side effects that usually do not require medical attention (report to your doctor or health care professional if they continue or are bothersome):  cramps  dark colored stools  diarrhea  headache  nausea, vomiting  stomach upset This list may not describe all possible side effects. Call your doctor for medical advice about side effects. You may report side effects to FDA at 1-800-FDA-1088. Where should I keep my medicine? This drug is given in a hospital or clinic and will not be stored at home. NOTE: This sheet is a summary. It may not cover all   possible information. If you have questions about this medicine, talk to your doctor, pharmacist, or health care provider.  2021 Elsevier/Gold Standard (2008-06-07 15:58:57)   

## 2021-02-28 ENCOUNTER — Inpatient Hospital Stay: Payer: Medicare Other | Admitting: Hematology & Oncology

## 2021-02-28 ENCOUNTER — Inpatient Hospital Stay: Payer: Medicare Other | Attending: Hematology & Oncology

## 2021-02-28 DIAGNOSIS — D510 Vitamin B12 deficiency anemia due to intrinsic factor deficiency: Secondary | ICD-10-CM | POA: Insufficient documentation

## 2021-02-28 DIAGNOSIS — M069 Rheumatoid arthritis, unspecified: Secondary | ICD-10-CM | POA: Insufficient documentation

## 2021-02-28 DIAGNOSIS — D509 Iron deficiency anemia, unspecified: Secondary | ICD-10-CM | POA: Insufficient documentation

## 2021-02-28 DIAGNOSIS — D693 Immune thrombocytopenic purpura: Secondary | ICD-10-CM | POA: Insufficient documentation

## 2021-02-28 DIAGNOSIS — L80 Vitiligo: Secondary | ICD-10-CM | POA: Insufficient documentation

## 2021-03-01 ENCOUNTER — Telehealth: Payer: Self-pay | Admitting: *Deleted

## 2021-03-01 NOTE — Telephone Encounter (Signed)
Per scheduling message Kathleen Pace 02/28/21 - called and lvm of upcoming appointment that was rescheduled from a no-show

## 2021-03-04 ENCOUNTER — Ambulatory Visit: Payer: Medicare Other | Admitting: Hematology & Oncology

## 2021-03-04 ENCOUNTER — Other Ambulatory Visit: Payer: Medicare Other

## 2021-03-05 ENCOUNTER — Encounter: Payer: Self-pay | Admitting: Hematology & Oncology

## 2021-03-05 ENCOUNTER — Telehealth: Payer: Self-pay

## 2021-03-05 ENCOUNTER — Inpatient Hospital Stay: Payer: Medicare Other

## 2021-03-05 ENCOUNTER — Inpatient Hospital Stay (HOSPITAL_BASED_OUTPATIENT_CLINIC_OR_DEPARTMENT_OTHER): Payer: Medicare Other | Admitting: Hematology & Oncology

## 2021-03-05 ENCOUNTER — Other Ambulatory Visit: Payer: Self-pay

## 2021-03-05 ENCOUNTER — Other Ambulatory Visit: Payer: Self-pay | Admitting: *Deleted

## 2021-03-05 VITALS — BP 94/50 | HR 71 | Temp 98.8°F | Resp 18 | Wt 103.2 lb

## 2021-03-05 DIAGNOSIS — D510 Vitamin B12 deficiency anemia due to intrinsic factor deficiency: Secondary | ICD-10-CM | POA: Diagnosis not present

## 2021-03-05 DIAGNOSIS — D509 Iron deficiency anemia, unspecified: Secondary | ICD-10-CM | POA: Diagnosis not present

## 2021-03-05 DIAGNOSIS — D500 Iron deficiency anemia secondary to blood loss (chronic): Secondary | ICD-10-CM

## 2021-03-05 DIAGNOSIS — D693 Immune thrombocytopenic purpura: Secondary | ICD-10-CM | POA: Diagnosis present

## 2021-03-05 DIAGNOSIS — M069 Rheumatoid arthritis, unspecified: Secondary | ICD-10-CM | POA: Diagnosis not present

## 2021-03-05 DIAGNOSIS — L80 Vitiligo: Secondary | ICD-10-CM | POA: Diagnosis not present

## 2021-03-05 LAB — CMP (CANCER CENTER ONLY)
ALT: 26 U/L (ref 0–44)
AST: 38 U/L (ref 15–41)
Albumin: 3.9 g/dL (ref 3.5–5.0)
Alkaline Phosphatase: 161 U/L — ABNORMAL HIGH (ref 38–126)
Anion gap: 11 (ref 5–15)
BUN: 18 mg/dL (ref 8–23)
CO2: 25 mmol/L (ref 22–32)
Calcium: 9.1 mg/dL (ref 8.9–10.3)
Chloride: 100 mmol/L (ref 98–111)
Creatinine: 0.64 mg/dL (ref 0.44–1.00)
GFR, Estimated: >60 mL/min (ref >60)
Glucose, Bld: 113 mg/dL — ABNORMAL HIGH (ref 70–99)
Potassium: 4 mmol/L (ref 3.5–5.1)
Sodium: 136 mmol/L (ref 135–145)
Total Bilirubin: 0.4 mg/dL (ref 0.3–1.2)
Total Protein: 6 g/dL — ABNORMAL LOW (ref 6.5–8.1)

## 2021-03-05 LAB — CBC WITH DIFFERENTIAL (CANCER CENTER ONLY)
Abs Immature Granulocytes: 0.03 10*3/uL (ref 0.00–0.07)
Basophils Absolute: 0.1 10*3/uL (ref 0.0–0.1)
Basophils Relative: 2 %
Eosinophils Absolute: 0.1 10*3/uL (ref 0.0–0.5)
Eosinophils Relative: 1 %
HCT: 25.3 % — ABNORMAL LOW (ref 36.0–46.0)
Hemoglobin: 7.8 g/dL — ABNORMAL LOW (ref 12.0–15.0)
Immature Granulocytes: 1 %
Lymphocytes Relative: 41 %
Lymphs Abs: 2 10*3/uL (ref 0.7–4.0)
MCH: 27.8 pg (ref 26.0–34.0)
MCHC: 30.8 g/dL (ref 30.0–36.0)
MCV: 90 fL (ref 80.0–100.0)
Monocytes Absolute: 1.1 10*3/uL — ABNORMAL HIGH (ref 0.1–1.0)
Monocytes Relative: 21 %
Neutro Abs: 1.7 10*3/uL (ref 1.7–7.7)
Neutrophils Relative %: 34 %
Platelet Count: 277 10*3/uL (ref 150–400)
RBC: 2.81 MIL/uL — ABNORMAL LOW (ref 3.87–5.11)
RDW: 18.3 % — ABNORMAL HIGH (ref 11.5–15.5)
WBC Count: 4.9 10*3/uL (ref 4.0–10.5)
nRBC: 0.4 % — ABNORMAL HIGH (ref 0.0–0.2)

## 2021-03-05 LAB — RETICULOCYTES
Immature Retic Fract: 27.1 % — ABNORMAL HIGH (ref 2.3–15.9)
RBC.: 2.81 MIL/uL — ABNORMAL LOW (ref 3.87–5.11)
Retic Count, Absolute: 70 10*3/uL (ref 19.0–186.0)
Retic Ct Pct: 2.5 % (ref 0.4–3.1)

## 2021-03-05 LAB — PREPARE RBC (CROSSMATCH)

## 2021-03-05 LAB — VITAMIN B12: Vitamin B-12: 300 pg/mL (ref 180–914)

## 2021-03-05 NOTE — Telephone Encounter (Signed)
appts made per 03/05/21 los and pt will gain appts in tx for 5/19   Kathleen Pace

## 2021-03-05 NOTE — Progress Notes (Signed)
Hematology and Oncology Follow Up Visit  Kathleen Pace [id number] 1941-01-23 80 y.o. 03/05/2021   Principle Diagnosis:  Chronic immune thrombocytopenia, remission. 2. Pernicious anemia. 3. Vitiligo. 4. Rheumatoid Arthritis 5. Iron Deficiency Anemia  Current Therapy:    Vitamin B12 1 mg IM Q. month   IV Iron as needed     Interim History:  Ms.  Pace is back for follow-up.  She feeling quite tired.  She has felt tired for the past week or so.  Unfortunately, I suspect that she is probably having some GI bleeding.  She is quite anemic today.  Her MCV is 90 which is quite low for her.  We did go ahead and do a rectal exam on her.  Her stool is brown but grossly heme positive.  She is going to need to have endoscopy done.  I will have to see about getting her set up with Gastroenterology.  Thankfully, her aortic valve after the TAVR procedure seems to be doing pretty well.  She has had no fever.  There is been no cough or shortness of breath.  She has had no rashes.  She did have a fall recently.  She says she broke a few ribs.  She has had no headache.  Overall, her performance status is ECOG 2.    She is on Plavix and aspirin.  I am not sure if she is going to have to stop these or not.  Medications:  Current Outpatient Medications:  .  alendronate (FOSAMAX) 70 MG tablet, Take 1 tablet po weekly.  Stay upright for 1 hour following, Disp: , Rfl:  .  amLODipine (NORVASC) 10 MG tablet, Take 10 mg by mouth daily., Disp: , Rfl:  .  aspirin 81 MG EC tablet, Take 1 tablet by mouth daily., Disp: , Rfl:  .  atorvastatin (LIPITOR) 80 MG tablet, SMARTSIG:1 Tablet(s) By Mouth Every Evening, Disp: , Rfl:  .  CALCIUM-VITAMIN D PO, Take 1 capsule by mouth every morning. Calcium 600 mg  Vitamin D 200, Disp: , Rfl:  .  Cholecalciferol (VITAMIN D) 2000 UNITS tablet, Take 2,000 Units by mouth daily., Disp: , Rfl:  .  clopidogrel (PLAVIX) 75 MG tablet, Take 75 mg by mouth daily., Disp: ,  Rfl:  .  cyanocobalamin (,VITAMIN B-12,) 1000 MCG/ML injection, Inject 1 cc every month into your muscle, Disp: 3 mL, Rfl: 24 .  cycloSPORINE (RESTASIS) 0.05 % ophthalmic emulsion, Place 1 drop into both eyes 2 times daily., Disp: , Rfl:  .  esomeprazole (NEXIUM) 20 MG capsule, Take 20 mg by mouth daily at 12 noon., Disp: , Rfl:  .  furosemide (LASIX) 20 MG tablet, Take 20 mg by mouth daily., Disp: , Rfl:  .  hydroxychloroquine (PLAQUENIL) 200 MG tablet, 200 mg daily. , Disp: , Rfl:  .  leflunomide (ARAVA) 10 MG tablet, Take 10 mg by mouth daily. Take 1 and 1/2 tablet total of 15 mg daily., Disp: , Rfl:  .  levothyroxine (SYNTHROID, LEVOTHROID) 125 MCG tablet, 125 mcg daily. 08/31/2019 One day each week, will increase to 1.5 tabs., Disp: , Rfl:  .  metoprolol succinate (TOPROL-XL) 25 MG 24 hr tablet, Take 25 mg by mouth daily., Disp: , Rfl:  .  prednisoLONE acetate (PRED FORTE) 1 % ophthalmic suspension, Place 1 drop into both eyes daily., Disp: , Rfl:  .  predniSONE (DELTASONE) 20 MG tablet, Take 20 mg by mouth daily. For 3 days, started 12/13/20., Disp: , Rfl:  .  sertraline (ZOLOFT) 50 MG tablet, Take 50 mg by mouth daily., Disp: , Rfl:  .  umeclidinium-vilanterol (ANORO ELLIPTA) 62.5-25 MCG/INH AEPB, Inhale into the lungs daily. , Disp: , Rfl:  .  valsartan (DIOVAN) 320 MG tablet, Take by mouth daily. , Disp: , Rfl:  .  acetaminophen (TYLENOL) 325 MG tablet, Take 650 mg by mouth every 6 (six) hours as needed. (Patient not taking: Reported on 03/05/2021), Disp: , Rfl:  .  albuterol (PROVENTIL HFA;VENTOLIN HFA) 108 (90 Base) MCG/ACT inhaler, Inhale into the lungs. (Patient not taking: No sig reported), Disp: , Rfl:  .  cephALEXin (KEFLEX) 500 MG capsule, Take 500 mg by mouth as needed. (Patient not taking: Reported on 03/05/2021), Disp: , Rfl:  .  Omega-3 Fatty Acids (OMEGA-3 FISH OIL PO), Take by mouth. (Patient not taking: No sig reported), Disp: , Rfl:   Allergies:  Allergies  Allergen  Reactions  . Iodinated Diagnostic Agents Swelling  . Sulfa Antibiotics Other (See Comments)    Drops Blood Pressure  . Spironolactone Rash    Rash with itching unresponsive to benadryl.   . Methotrexate Other (See Comments)  . Doxycycline Nausea Only  . Penicillins Rash    Patient tolerated cefazolin on 01/10/21  . Quinine Derivatives Other (See Comments)    Patient doesn't remember reaction    Past Medical History, Surgical history, Social history, and Family History were reviewed and updated.  Review of Systems: Review of Systems  Constitutional: Positive for malaise/fatigue.  HENT: Positive for congestion.   Eyes: Negative.   Respiratory: Positive for cough.   Cardiovascular: Positive for palpitations.  Gastrointestinal: Positive for diarrhea and nausea.  Genitourinary: Negative.   Musculoskeletal: Positive for joint pain and myalgias.  Skin: Negative.   Neurological: Negative.   Endo/Heme/Allergies: Negative.   Psychiatric/Behavioral: Negative.      Physical Exam:  weight is 103 lb 4 oz (46.8 kg). Her oral temperature is 98.8 F (37.1 C). Her blood pressure is 94/50 (abnormal) and her pulse is 71. Her respiration is 18 and oxygen saturation is 100%.   Physical Exam Vitals reviewed.  HENT:     Head: Normocephalic and atraumatic.  Eyes:     Pupils: Pupils are equal, round, and reactive to light.  Cardiovascular:     Rate and Rhythm: Normal rate and regular rhythm.     Heart sounds: Normal heart sounds.  Pulmonary:     Effort: Pulmonary effort is normal.     Breath sounds: Normal breath sounds.  Abdominal:     General: Bowel sounds are normal.     Palpations: Abdomen is soft.  Musculoskeletal:        General: No tenderness or deformity. Normal range of motion.     Cervical back: Normal range of motion.  Lymphadenopathy:     Cervical: No cervical adenopathy.  Skin:    General: Skin is warm and dry.     Findings: No erythema or rash.  Neurological:      Mental Status: She is alert and oriented to person, place, and time.  Psychiatric:        Behavior: Behavior normal.        Thought Content: Thought content normal.        Judgment: Judgment normal.      Lab Results  Component Value Date   WBC 4.9 03/05/2021   HGB 7.8 (L) 03/05/2021   HCT 25.3 (L) 03/05/2021   MCV 90.0 03/05/2021   PLT 277 03/05/2021  Chemistry      Component Value Date/Time   NA 136 03/05/2021 1309   NA 144 09/11/2017 1335   NA 138 07/31/2016 0947   K 4.0 03/05/2021 1309   K 4.2 09/11/2017 1335   K 4.3 07/31/2016 0947   CL 100 03/05/2021 1309   CL 104 09/11/2017 1335   CO2 25 03/05/2021 1309   CO2 28 09/11/2017 1335   CO2 27 07/31/2016 0947   BUN 18 03/05/2021 1309   BUN 17 09/11/2017 1335   BUN 14.7 07/31/2016 0947   CREATININE 0.64 03/05/2021 1309   CREATININE 0.9 09/11/2017 1335   CREATININE 0.7 07/31/2016 0947      Component Value Date/Time   CALCIUM 9.1 03/05/2021 1309   CALCIUM 9.0 09/11/2017 1335   CALCIUM 9.2 07/31/2016 0947   ALKPHOS 161 (H) 03/05/2021 1309   ALKPHOS 102 (H) 09/11/2017 1335   ALKPHOS 70 07/31/2016 0947   AST 38 03/05/2021 1309   AST 18 07/31/2016 0947   ALT 26 03/05/2021 1309   ALT 20 09/11/2017 1335   ALT 9 07/31/2016 0947   BILITOT 0.4 03/05/2021 1309   BILITOT 0.62 07/31/2016 0947      Impression and Plan: Ms. Laiche is 80 year old white female. She has a clear autoimmune issue. She has pernicious anemia. She has vitiligo. She has hypo-thyroidism.  She is on Plaquenil.  I am sure her iron studies are low.  Again she is having some GI bleeding.  We will have to see where the blood loss is coming from.  Again, we will have to see if gastroenterology can see her.  We are going to have to transfuse her.  I know her iron is low.  However, I really think that a blood transfusion will help more than anything.  I am sure we will have to give her iron also.  We will see about getting her in in a couple days for  iron.  I will plan to see her back in another month.   Volanda Napoleon, MD 5/17/20222:26 PM

## 2021-03-06 ENCOUNTER — Inpatient Hospital Stay: Payer: Medicare Other

## 2021-03-06 ENCOUNTER — Other Ambulatory Visit: Payer: Self-pay | Admitting: *Deleted

## 2021-03-06 DIAGNOSIS — K909 Intestinal malabsorption, unspecified: Secondary | ICD-10-CM

## 2021-03-06 DIAGNOSIS — D500 Iron deficiency anemia secondary to blood loss (chronic): Secondary | ICD-10-CM

## 2021-03-06 DIAGNOSIS — D510 Vitamin B12 deficiency anemia due to intrinsic factor deficiency: Secondary | ICD-10-CM

## 2021-03-06 DIAGNOSIS — D693 Immune thrombocytopenic purpura: Secondary | ICD-10-CM | POA: Diagnosis not present

## 2021-03-06 LAB — ABO/RH: ABO/RH(D): O POS

## 2021-03-06 LAB — FERRITIN: Ferritin: 47 ng/mL (ref 11–307)

## 2021-03-06 LAB — IRON AND TIBC
Iron: 13 ug/dL — ABNORMAL LOW (ref 41–142)
Saturation Ratios: 3 % — ABNORMAL LOW (ref 21–57)
TIBC: 365 ug/dL (ref 236–444)
UIBC: 352 ug/dL (ref 120–384)

## 2021-03-07 ENCOUNTER — Inpatient Hospital Stay: Payer: Medicare Other

## 2021-03-07 ENCOUNTER — Other Ambulatory Visit: Payer: Self-pay

## 2021-03-07 ENCOUNTER — Other Ambulatory Visit: Payer: Self-pay | Admitting: Family

## 2021-03-07 DIAGNOSIS — D693 Immune thrombocytopenic purpura: Secondary | ICD-10-CM | POA: Diagnosis not present

## 2021-03-07 DIAGNOSIS — I10 Essential (primary) hypertension: Secondary | ICD-10-CM

## 2021-03-07 DIAGNOSIS — D500 Iron deficiency anemia secondary to blood loss (chronic): Secondary | ICD-10-CM

## 2021-03-07 MED ORDER — ACETAMINOPHEN 325 MG PO TABS
650.0000 mg | ORAL_TABLET | Freq: Once | ORAL | Status: AC
  Administered 2021-03-07: 650 mg via ORAL

## 2021-03-07 MED ORDER — CLONIDINE HCL 0.1 MG PO TABS: 0.1000 mg | ORAL_TABLET | Freq: Once | ORAL | Status: DC

## 2021-03-07 MED ORDER — SODIUM CHLORIDE 0.9% IV SOLUTION
250.0000 mL | Freq: Once | INTRAVENOUS | Status: AC
  Administered 2021-03-07: 250 mL via INTRAVENOUS
  Filled 2021-03-07: qty 250

## 2021-03-07 MED ORDER — DIPHENHYDRAMINE HCL 25 MG PO CAPS
25.0000 mg | ORAL_CAPSULE | Freq: Once | ORAL | Status: AC
  Administered 2021-03-07: 25 mg via ORAL

## 2021-03-07 NOTE — Patient Instructions (Signed)
[employer] medicine (25th ed., pp. [phone]). [place], [place]: Elsevier.">  Anemia  Anemia is a condition in which there is not enough red blood cells or hemoglobin in the blood. Hemoglobin is a substance in red blood cells that carries oxygen. When you do not have enough red blood cells or hemoglobin (are anemic), your body cannot get enough oxygen and your organs may not work properly. As a result, you may feel very tired or have other problems. What are the causes? Common causes of anemia include:  Excessive bleeding. Anemia can be caused by excessive bleeding inside or outside the body, including bleeding from the intestines or from heavy menstrual periods in females.  Poor nutrition.  Long-lasting (chronic) kidney, thyroid, and liver disease.  Bone marrow disorders, spleen problems, and blood disorders.  Cancer and treatments for cancer.  HIV (human immunodeficiency virus) and AIDS (acquired immunodeficiency syndrome).  Infections, medicines, and autoimmune disorders that destroy red blood cells. What are the signs or symptoms? Symptoms of this condition include:  Minor weakness.  Dizziness.  Headache, or difficulties concentrating and sleeping.  Heartbeats that feel irregular or faster than normal (palpitations).  Shortness of breath, especially with exercise.  Pale skin, lips, and nails, or cold hands and feet.  Indigestion and nausea. Symptoms may occur suddenly or develop slowly. If your anemia is mild, you may not have symptoms. How is this diagnosed? This condition is diagnosed based on blood tests, your medical history, and a physical exam. In some cases, a test may be needed in which cells are removed from the soft tissue inside of a bone and looked at under a microscope (bone marrow biopsy). Your health care provider may also check your stool (feces) for blood and may do additional testing to look for the cause of your bleeding. Other tests may  include:  Imaging tests, such as a CT scan or MRI.  A procedure to see inside your esophagus and stomach (endoscopy).  A procedure to see inside your colon and rectum (colonoscopy). How is this treated? Treatment for this condition depends on the cause. If you continue to lose a lot of blood, you may need to be treated at a hospital. Treatment may include:  Taking supplements of iron, vitamin Q68, or folic acid.  Taking a hormone medicine (erythropoietin) that can help to stimulate red blood cell growth.  Having a blood transfusion. This may be needed if you lose a lot of blood.  Making changes to your diet.  Having surgery to remove your spleen. Follow these instructions at home:  Take over-the-counter and prescription medicines only as told by your health care provider.  Take supplements only as told by your health care provider.  Follow any diet instructions that you were given by your health care provider.  Keep all follow-up visits as told by your health care provider. This is important. Contact a health care provider if:  You develop new bleeding anywhere in the body. Get help right away if:  You are very weak.  You are short of breath.  You have pain in your abdomen or chest.  You are dizzy or feel faint.  You have trouble concentrating.  You have bloody stools, black stools, or tarry stools.  You vomit repeatedly or you vomit up blood. These symptoms may represent a serious problem that is an emergency. Do not wait to see if the symptoms will go away. Get medical help right away. Call your local emergency services (911 in the [place] Do not  drive yourself to the hospital. Summary  Anemia is a condition in which you do not have enough red blood cells or enough of a substance in your red blood cells that carries oxygen (hemoglobin).  Symptoms may occur suddenly or develop slowly.  If your anemia is mild, you may not have symptoms.  This condition is  diagnosed with blood tests, a medical history, and a physical exam. Other tests may be needed.  Treatment for this condition depends on the cause of the anemia. This information is not intended to replace advice given to you by your health care provider. Make sure you discuss any questions you have with your health care provider. Document Revised: 09/13/2019 Document Reviewed: 09/13/2019 Elsevier Patient Education  2021 [employer].  

## 2021-03-07 NOTE — Progress Notes (Signed)
Patient did not take her prescribed antihypertensives this morning.

## 2021-03-07 NOTE — Progress Notes (Signed)
Clonidine 0.1 mg given per Laverna Peace, NP d/t high blood pressure at the end of blood transfusion. Could not give Lasix d/t discontinued IV.

## 2021-03-08 LAB — TYPE AND SCREEN
ABO/RH(D): O POS
Antibody Screen: NEGATIVE
Unit division: 0
Unit division: 0

## 2021-03-08 LAB — BPAM RBC
Blood Product Expiration Date: 202206152359
Blood Product Expiration Date: 202206152359
ISSUE DATE / TIME: 202205190741
ISSUE DATE / TIME: 202205190741
Unit Type and Rh: 5100
Unit Type and Rh: 5100

## 2021-03-12 ENCOUNTER — Ambulatory Visit: Payer: Medicare Other | Admitting: Hematology & Oncology

## 2021-03-12 ENCOUNTER — Other Ambulatory Visit: Payer: Medicare Other

## 2021-04-05 ENCOUNTER — Inpatient Hospital Stay: Payer: Medicare Other

## 2021-04-05 ENCOUNTER — Telehealth: Payer: Self-pay | Admitting: *Deleted

## 2021-04-05 ENCOUNTER — Telehealth: Payer: Self-pay

## 2021-04-05 ENCOUNTER — Encounter: Payer: Self-pay | Admitting: Hematology & Oncology

## 2021-04-05 ENCOUNTER — Inpatient Hospital Stay: Payer: Medicare Other | Attending: Hematology & Oncology

## 2021-04-05 ENCOUNTER — Inpatient Hospital Stay: Payer: Medicare Other | Admitting: Hematology & Oncology

## 2021-04-05 ENCOUNTER — Other Ambulatory Visit: Payer: Self-pay

## 2021-04-05 VITALS — BP 118/56 | HR 58 | Temp 98.0°F | Resp 18 | Wt 102.0 lb

## 2021-04-05 DIAGNOSIS — E039 Hypothyroidism, unspecified: Secondary | ICD-10-CM | POA: Insufficient documentation

## 2021-04-05 DIAGNOSIS — D509 Iron deficiency anemia, unspecified: Secondary | ICD-10-CM | POA: Diagnosis not present

## 2021-04-05 DIAGNOSIS — M069 Rheumatoid arthritis, unspecified: Secondary | ICD-10-CM | POA: Diagnosis not present

## 2021-04-05 DIAGNOSIS — D510 Vitamin B12 deficiency anemia due to intrinsic factor deficiency: Secondary | ICD-10-CM | POA: Insufficient documentation

## 2021-04-05 DIAGNOSIS — D500 Iron deficiency anemia secondary to blood loss (chronic): Secondary | ICD-10-CM

## 2021-04-05 DIAGNOSIS — Z79899 Other long term (current) drug therapy: Secondary | ICD-10-CM | POA: Insufficient documentation

## 2021-04-05 DIAGNOSIS — D693 Immune thrombocytopenic purpura: Secondary | ICD-10-CM | POA: Diagnosis present

## 2021-04-05 DIAGNOSIS — L80 Vitiligo: Secondary | ICD-10-CM | POA: Diagnosis not present

## 2021-04-05 DIAGNOSIS — D696 Thrombocytopenia, unspecified: Secondary | ICD-10-CM | POA: Insufficient documentation

## 2021-04-05 LAB — CMP (CANCER CENTER ONLY)
ALT: 31 U/L (ref 0–44)
AST: 52 U/L — ABNORMAL HIGH (ref 15–41)
Albumin: 4 g/dL (ref 3.5–5.0)
Alkaline Phosphatase: 195 U/L — ABNORMAL HIGH (ref 38–126)
Anion gap: 7 (ref 5–15)
BUN: 14 mg/dL (ref 8–23)
CO2: 28 mmol/L (ref 22–32)
Calcium: 9.9 mg/dL (ref 8.9–10.3)
Chloride: 100 mmol/L (ref 98–111)
Creatinine: 0.65 mg/dL (ref 0.44–1.00)
GFR, Estimated: >60 mL/min (ref >60)
Glucose, Bld: 95 mg/dL (ref 70–99)
Potassium: 4.3 mmol/L (ref 3.5–5.1)
Sodium: 135 mmol/L (ref 135–145)
Total Bilirubin: 0.4 mg/dL (ref 0.3–1.2)
Total Protein: 7 g/dL (ref 6.5–8.1)

## 2021-04-05 LAB — CBC WITH DIFFERENTIAL (CANCER CENTER ONLY)
Abs Immature Granulocytes: 0.02 10*3/uL (ref 0.00–0.07)
Basophils Absolute: 0.1 10*3/uL (ref 0.0–0.1)
Basophils Relative: 2 %
Eosinophils Absolute: 0.1 10*3/uL (ref 0.0–0.5)
Eosinophils Relative: 2 %
HCT: 35 % — ABNORMAL LOW (ref 36.0–46.0)
Hemoglobin: 11.1 g/dL — ABNORMAL LOW (ref 12.0–15.0)
Immature Granulocytes: 0 %
Lymphocytes Relative: 41 %
Lymphs Abs: 2.5 10*3/uL (ref 0.7–4.0)
MCH: 27.5 pg (ref 26.0–34.0)
MCHC: 31.7 g/dL (ref 30.0–36.0)
MCV: 86.8 fL (ref 80.0–100.0)
Monocytes Absolute: 1.3 10*3/uL — ABNORMAL HIGH (ref 0.1–1.0)
Monocytes Relative: 21 %
Neutro Abs: 2.1 10*3/uL (ref 1.7–7.7)
Neutrophils Relative %: 34 %
Platelet Count: 335 10*3/uL (ref 150–400)
RBC: 4.03 MIL/uL (ref 3.87–5.11)
RDW: 19.5 % — ABNORMAL HIGH (ref 11.5–15.5)
WBC Count: 6.1 10*3/uL (ref 4.0–10.5)
nRBC: 0 % (ref 0.0–0.2)

## 2021-04-05 LAB — IRON AND TIBC
Iron: 28 ug/dL (ref 28–170)
Saturation Ratios: 7 % — ABNORMAL LOW (ref 10.4–31.8)
TIBC: 429 ug/dL (ref 250–450)
UIBC: 401 ug/dL

## 2021-04-05 LAB — RETICULOCYTES
Immature Retic Fract: 11.1 % (ref 2.3–15.9)
RBC.: 3.99 MIL/uL (ref 3.87–5.11)
Retic Count, Absolute: 47.9 10*3/uL (ref 19.0–186.0)
Retic Ct Pct: 1.2 % (ref 0.4–3.1)

## 2021-04-05 LAB — FERRITIN: Ferritin: 24 ng/mL (ref 11–307)

## 2021-04-05 LAB — VITAMIN B12: Vitamin B-12: 385 pg/mL (ref 180–914)

## 2021-04-05 NOTE — Progress Notes (Signed)
Hematology and Oncology Follow Up Visit  Kathleen Pace [id number] 1941/10/14 80 y.o. 04/05/2021   Principle Diagnosis:  Chronic immune thrombocytopenia, remission. 2. Pernicious anemia. 3. Vitiligo. 4. Rheumatoid Arthritis 5. Iron Deficiency Anemia  Current Therapy:   Vitamin B12 1 mg IM Q. month  IV Iron as needed     Interim History:  Ms.  Pace is back for follow-up.  She is doing much better.  We gave her a blood and iron over last saw her.  Her hemoglobin was quite low.  Her stool was heme positive.  Her last colonoscopy was about 3 years ago.  Wefound that she was severely iron deficient.  Her iron saturation was only 3%.  Her ferritin was 47.  At that time, her vitamin B12 was 300.  Again, she is feeling much better.  Her hemoglobin is 11.1.  Her MCV has gone up which is nice to see.  She has had no problems with nausea or vomiting.  She is eating better.  She had the TAVR procedure.  This was I think back in February or March.  She has done well with this.  Overall, her performance status is ECOG 1.    Medications:  Current Outpatient Medications:    acetaminophen (TYLENOL) 325 MG tablet, Take 650 mg by mouth every 6 (six) hours as needed. (Patient not taking: Reported on 03/05/2021), Disp: , Rfl:    albuterol (PROVENTIL HFA;VENTOLIN HFA) 108 (90 Base) MCG/ACT inhaler, Inhale into the lungs. (Patient not taking: No sig reported), Disp: , Rfl:    amLODipine (NORVASC) 10 MG tablet, Take 10 mg by mouth daily., Disp: , Rfl:    aspirin 81 MG EC tablet, Take 1 tablet by mouth daily., Disp: , Rfl:    atorvastatin (LIPITOR) 80 MG tablet, SMARTSIG:1 Tablet(s) By Mouth Every Evening, Disp: , Rfl:    CALCIUM-VITAMIN D PO, Take 1 capsule by mouth every morning. Calcium 600 mg  Vitamin D 200, Disp: , Rfl:    cephALEXin (KEFLEX) 500 MG capsule, Take 500 mg by mouth as needed. (Patient not taking: Reported on 03/05/2021), Disp: , Rfl:    Cholecalciferol (VITAMIN D) 2000 UNITS  tablet, Take 2,000 Units by mouth daily., Disp: , Rfl:    clopidogrel (PLAVIX) 75 MG tablet, Take 75 mg by mouth daily., Disp: , Rfl:    cyanocobalamin (,VITAMIN B-12,) 1000 MCG/ML injection, Inject 1 cc every month into your muscle, Disp: 3 mL, Rfl: 24   cycloSPORINE (RESTASIS) 0.05 % ophthalmic emulsion, Place 1 drop into both eyes 2 times daily., Disp: , Rfl:    esomeprazole (NEXIUM) 20 MG capsule, Take 20 mg by mouth daily at 12 noon., Disp: , Rfl:    furosemide (LASIX) 20 MG tablet, Take 20 mg by mouth daily., Disp: , Rfl:    hydroxychloroquine (PLAQUENIL) 200 MG tablet, 200 mg daily. , Disp: , Rfl:    leflunomide (ARAVA) 10 MG tablet, Take 10 mg by mouth daily. Take 1 and 1/2 tablet total of 15 mg daily., Disp: , Rfl:    levothyroxine (SYNTHROID, LEVOTHROID) 125 MCG tablet, 125 mcg daily. 08/31/2019 One day each week, will increase to 1.5 tabs., Disp: , Rfl:    metoprolol succinate (TOPROL-XL) 25 MG 24 hr tablet, Take 25 mg by mouth daily., Disp: , Rfl:    Omega-3 Fatty Acids (OMEGA-3 FISH OIL PO), Take by mouth. (Patient not taking: No sig reported), Disp: , Rfl:    prednisoLONE acetate (PRED FORTE) 1 % ophthalmic suspension, Place 1  drop into both eyes daily., Disp: , Rfl:    sertraline (ZOLOFT) 50 MG tablet, Take 50 mg by mouth daily., Disp: , Rfl:    umeclidinium-vilanterol (ANORO ELLIPTA) 62.5-25 MCG/INH AEPB, Inhale into the lungs daily. , Disp: , Rfl:    valsartan (DIOVAN) 320 MG tablet, Take by mouth daily. , Disp: , Rfl:   Allergies:  Allergies  Allergen Reactions   Iodinated Diagnostic Agents Swelling   Sulfa Antibiotics Other (See Comments)    Drops Blood Pressure   Spironolactone Rash    Rash with itching unresponsive to benadryl.    Methotrexate Other (See Comments)   Doxycycline Nausea Only   Penicillins Rash    Patient tolerated cefazolin on 01/10/21   Quinine Derivatives Other (See Comments)    Patient doesn't remember reaction    Past Medical History, Surgical  history, Social history, and Family History were reviewed and updated.  Review of Systems: Review of Systems  Constitutional:  Positive for malaise/fatigue.  HENT:  Positive for congestion.   Eyes: Negative.   Respiratory:  Positive for cough.   Cardiovascular:  Positive for palpitations.  Gastrointestinal:  Positive for diarrhea and nausea.  Genitourinary: Negative.   Musculoskeletal:  Positive for joint pain and myalgias.  Skin: Negative.   Neurological: Negative.   Endo/Heme/Allergies: Negative.   Psychiatric/Behavioral: Negative.      Physical Exam:  weight is 102 lb (46.3 kg). Her oral temperature is 98 F (36.7 C). Her blood pressure is 118/56 (abnormal) and her pulse is 58 (abnormal). Her respiration is 18 and oxygen saturation is 98%.   Physical Exam Vitals reviewed.  HENT:     Head: Normocephalic and atraumatic.  Eyes:     Pupils: Pupils are equal, round, and reactive to light.  Cardiovascular:     Rate and Rhythm: Normal rate and regular rhythm.     Heart sounds: Normal heart sounds.  Pulmonary:     Effort: Pulmonary effort is normal.     Breath sounds: Normal breath sounds.  Abdominal:     General: Bowel sounds are normal.     Palpations: Abdomen is soft.  Musculoskeletal:        General: No tenderness or deformity. Normal range of motion.     Cervical back: Normal range of motion.  Lymphadenopathy:     Cervical: No cervical adenopathy.  Skin:    General: Skin is warm and dry.     Findings: No erythema or rash.  Neurological:     Mental Status: She is alert and oriented to person, place, and time.  Psychiatric:        Behavior: Behavior normal.        Thought Content: Thought content normal.        Judgment: Judgment normal.     Lab Results  Component Value Date   WBC 6.1 04/05/2021   HGB 11.1 (L) 04/05/2021   HCT 35.0 (L) 04/05/2021   MCV 86.8 04/05/2021   PLT 335 04/05/2021     Chemistry      Component Value Date/Time   NA 135 04/05/2021  0901   NA 144 09/11/2017 1335   NA 138 07/31/2016 0947   K 4.3 04/05/2021 0901   K 4.2 09/11/2017 1335   K 4.3 07/31/2016 0947   CL 100 04/05/2021 0901   CL 104 09/11/2017 1335   CO2 28 04/05/2021 0901   CO2 28 09/11/2017 1335   CO2 27 07/31/2016 0947   BUN 14 04/05/2021 0901   BUN  17 09/11/2017 1335   BUN 14.7 07/31/2016 0947   CREATININE 0.65 04/05/2021 0901   CREATININE 0.9 09/11/2017 1335   CREATININE 0.7 07/31/2016 0947      Component Value Date/Time   CALCIUM 9.9 04/05/2021 0901   CALCIUM 9.0 09/11/2017 1335   CALCIUM 9.2 07/31/2016 0947   ALKPHOS 195 (H) 04/05/2021 0901   ALKPHOS 102 (H) 09/11/2017 1335   ALKPHOS 70 07/31/2016 0947   AST 52 (H) 04/05/2021 0901   AST 18 07/31/2016 0947   ALT 31 04/05/2021 0901   ALT 20 09/11/2017 1335   ALT 9 07/31/2016 0947   BILITOT 0.4 04/05/2021 0901   BILITOT 0.62 07/31/2016 0947      Impression and Plan: Ms. Geis is 80 year old white female. She has a clear autoimmune issue. She has pernicious anemia. She has vitiligo. She has hypo-thyroidism.  She is on Plaquenil.  I we will see what her iron studies look like.  Hopefully, they are better.  If not, we will still give her some iron.  I still think we have to follow her closely.  I would like to see her back in about 6 weeks.  If we see that her hemoglobin is dropping, then we will definitely need to see about an upper endoscopy.  She says that her Gastroenterologist is from [facility].    Volanda Napoleon, MD 6/17/202210:35 AM

## 2021-04-05 NOTE — Telephone Encounter (Signed)
Per 04/05/21 los - called and gave up coming appointments - patient confirmed 

## 2021-04-05 NOTE — Telephone Encounter (Signed)
Called and informed patient of lab results, patient verbalized understanding and denies any questions or concerns at this time.  Message sent to scheduling to schedule apt.

## 2021-04-05 NOTE — Telephone Encounter (Signed)
-----   Message from Volanda Napoleon, MD sent at 04/05/2021  3:43 PM EDT ----- Please call and tell her that the iron levels are still low.  We need to give her some more IV iron.  Please set this up.  Thank you.  [name]

## 2021-04-05 NOTE — Telephone Encounter (Signed)
Per results message 04/05/21 - called and lvm of upcoming appointment for (1) dose of IV Iron per Dr. Marin Olp

## 2021-04-08 ENCOUNTER — Inpatient Hospital Stay: Payer: Medicare Other

## 2021-04-08 ENCOUNTER — Other Ambulatory Visit: Payer: Self-pay

## 2021-04-08 VITALS — BP 155/49 | HR 56 | Temp 97.9°F | Resp 17

## 2021-04-08 DIAGNOSIS — D696 Thrombocytopenia, unspecified: Secondary | ICD-10-CM | POA: Diagnosis not present

## 2021-04-08 DIAGNOSIS — D500 Iron deficiency anemia secondary to blood loss (chronic): Secondary | ICD-10-CM

## 2021-04-08 MED ORDER — METHYLPREDNISOLONE SODIUM SUCC 125 MG IJ SOLR
INTRAMUSCULAR | Status: AC
  Filled 2021-04-08: qty 2

## 2021-04-08 MED ORDER — FAMOTIDINE IN NACL 20-0.9 MG/50ML-% IV SOLN: 20.0000 mg | Freq: Once | INTRAVENOUS | Status: DC

## 2021-04-08 MED ORDER — NA FERRIC GLUC CPLX IN SUCROSE 12.5 MG/ML IV SOLN
125.0000 mg | Freq: Once | INTRAVENOUS | Status: AC
  Administered 2021-04-08: 125 mg via INTRAVENOUS
  Filled 2021-04-08: qty 125

## 2021-04-08 MED ORDER — SODIUM CHLORIDE 0.9 % IV SOLN
Freq: Once | INTRAVENOUS | Status: AC
  Filled 2021-04-08: qty 250

## 2021-04-08 MED ORDER — METHYLPREDNISOLONE SODIUM SUCC 125 MG IJ SOLR
60.0000 mg | Freq: Once | INTRAMUSCULAR | Status: AC
  Administered 2021-04-08: 60 mg via INTRAVENOUS

## 2021-04-08 MED ORDER — FAMOTIDINE 20 MG IN NS 100 ML IVPB
20.0000 mg | Freq: Two times a day (BID) | INTRAVENOUS | Status: DC
  Administered 2021-04-08 (×2): 20 mg via INTRAVENOUS
  Filled 2021-04-08: qty 100

## 2021-04-08 NOTE — Patient Instructions (Signed)
Iron Deficiency Anemia, Adult Iron deficiency anemia is when you do not have enough red blood cells or hemoglobin in your blood. This happens because you have too little iron in your body. Hemoglobin carries oxygen to parts of the body. Anemia can cause yourbody to not get enough oxygen. What are the causes? Not eating enough foods that have iron in them. The body not being able to take in iron well. Needing more iron due to pregnancy or heavy menstrual periods, for females. Cancer. Bleeding in the bowels. Many blood draws. What increases the risk? Being pregnant. Being a teenage girl going through a growth spurt. What are the signs or symptoms? Pale skin, lips, and nails. Weakness, dizziness, and getting tired easily. Headache. Feeling like you cannot breathe well when moving (shortness of breath). Cold hands and feet. Fast heartbeat or a heartbeat that is not regular. Feeling grouchy (irritable) or breathing fast. These are more common in very bad anemia. Mild anemia may not cause any symptoms. How is this treated? This condition is treated by finding out why you do not have enough iron and then getting more iron. It may include: Adding foods to your diet that have a lot of iron. Taking iron pills (supplements). If you are pregnant or breastfeeding, you may need to take extra iron. Your diet often does not provide the amount of iron that you need. Getting more vitamin C in your diet. Vitamin C helps your body take in iron. You may need to take iron pills with a glass of orange juice or vitamin C pills. Medicines to make heavy menstrual periods lighter. Surgery. You may need blood tests to see if treatment is working. If the treatment doesnot seem to be working, you may need more tests. Follow these instructions at home: Medicines Take over-the-counter and prescription medicines only as told by your doctor. This includes iron pills and vitamins. Take iron pills when your stomach is  empty. If you cannot handle this, take them with food. Do not drink milk or take antacids at the same time as your iron pills. Iron pills may turn your poop (stool)black. If you cannot handle taking iron pills by mouth, ask your doctor about getting iron through: An IV tube. A shot (injection) into a muscle. Eating and drinking  Talk with your doctor before changing the foods you eat. He or she may tell you to eat foods that have a lot of iron, such as: Liver. Low-fat (lean) beef. Breads and cereals that have iron added to them. Eggs. Dried fruit. Dark green, leafy vegetables. Eat fresh fruits and vegetables that are high in vitamin C. They help your body use iron. Foods with a lot of vitamin C include: Oranges. Peppers. Tomatoes. Mangoes. Drink enough fluid to keep your pee (urine) pale yellow.  Managing constipation If you are taking iron pills, they may cause trouble pooping (constipation). To prevent or treat trouble pooping, you may need to: Take over-the-counter or prescription medicines. Eat foods that are high in fiber. These include beans, whole grains, and fresh fruits and vegetables. Limit foods that are high in fat and sugar. These include fried or sweet foods. General instructions Return to your normal activities as told by your doctor. Ask your doctor what activities are safe for you. Keep yourself clean, and keep things clean around you. Keep all follow-up visits as told by your doctor. This is important. Contact a doctor if: You feel like you may vomit (nauseous), or you vomit. You feel   weak. You are sweating for no reason. You have trouble pooping, such as: Pooping less than 3 times a week. Straining to poop. Having poop that is hard, dry, or larger than normal. Feeling full or bloated. Pain in the lower belly. Not feeling better after pooping. Get help right away if: You pass out (faint). You have chest pain. You have trouble breathing that: Is very  bad. Gets worse with physical activity. You have a fast heartbeat, or a heartbeat that does not feel regular. You get light-headed when getting up from sitting or lying down. These symptoms may be an emergency. Do not wait to see if the symptoms will go away. Get medical help right away. Call your local emergency services (911 in the [place] Do not drive yourself to the hospital. Summary Iron deficiency anemia is when you have too little iron in your body. This condition is treated by finding out why you do not have enough iron in your body and then getting more iron. Take over-the-counter and prescription medicines only as told by your doctor. Eat fresh fruits and vegetables that are high in vitamin C. Get help right away if you cannot breathe well. This information is not intended to replace advice given to you by your health care provider. Make sure you discuss any questions you have with your healthcare provider. Document Revised: 06/14/2019 Document Reviewed: 06/14/2019 Elsevier Patient Education  2022 [employer] Inc[employer]  

## 2021-05-17 ENCOUNTER — Encounter: Payer: Self-pay | Admitting: Hematology & Oncology

## 2021-05-17 ENCOUNTER — Other Ambulatory Visit: Payer: Self-pay

## 2021-05-17 ENCOUNTER — Inpatient Hospital Stay: Payer: Medicare Other | Attending: Hematology & Oncology

## 2021-05-17 ENCOUNTER — Inpatient Hospital Stay: Payer: Medicare Other | Admitting: Hematology & Oncology

## 2021-05-17 VITALS — BP 103/50 | HR 75 | Temp 97.8°F | Resp 24 | Wt 101.1 lb

## 2021-05-17 DIAGNOSIS — D500 Iron deficiency anemia secondary to blood loss (chronic): Secondary | ICD-10-CM

## 2021-05-17 DIAGNOSIS — D510 Vitamin B12 deficiency anemia due to intrinsic factor deficiency: Secondary | ICD-10-CM | POA: Insufficient documentation

## 2021-05-17 DIAGNOSIS — R531 Weakness: Secondary | ICD-10-CM | POA: Diagnosis not present

## 2021-05-17 DIAGNOSIS — M069 Rheumatoid arthritis, unspecified: Secondary | ICD-10-CM | POA: Insufficient documentation

## 2021-05-17 DIAGNOSIS — E039 Hypothyroidism, unspecified: Secondary | ICD-10-CM | POA: Diagnosis not present

## 2021-05-17 DIAGNOSIS — L80 Vitiligo: Secondary | ICD-10-CM | POA: Diagnosis not present

## 2021-05-17 DIAGNOSIS — Z7982 Long term (current) use of aspirin: Secondary | ICD-10-CM | POA: Diagnosis not present

## 2021-05-17 DIAGNOSIS — D509 Iron deficiency anemia, unspecified: Secondary | ICD-10-CM | POA: Diagnosis not present

## 2021-05-17 DIAGNOSIS — D693 Immune thrombocytopenic purpura: Secondary | ICD-10-CM | POA: Insufficient documentation

## 2021-05-17 DIAGNOSIS — R5383 Other fatigue: Secondary | ICD-10-CM | POA: Diagnosis not present

## 2021-05-17 DIAGNOSIS — Z79899 Other long term (current) drug therapy: Secondary | ICD-10-CM | POA: Diagnosis not present

## 2021-05-17 LAB — CMP (CANCER CENTER ONLY)
ALT: 18 U/L (ref 0–44)
AST: 30 U/L (ref 15–41)
Albumin: 3.8 g/dL (ref 3.5–5.0)
Alkaline Phosphatase: 132 U/L — ABNORMAL HIGH (ref 38–126)
Anion gap: 8 (ref 5–15)
BUN: 15 mg/dL (ref 8–23)
CO2: 30 mmol/L (ref 22–32)
Calcium: 9.6 mg/dL (ref 8.9–10.3)
Chloride: 101 mmol/L (ref 98–111)
Creatinine: 0.61 mg/dL (ref 0.44–1.00)
GFR, Estimated: >60 mL/min (ref >60)
Glucose, Bld: 82 mg/dL (ref 70–99)
Potassium: 4.2 mmol/L (ref 3.5–5.1)
Sodium: 139 mmol/L (ref 135–145)
Total Bilirubin: 0.4 mg/dL (ref 0.3–1.2)
Total Protein: 6.6 g/dL (ref 6.5–8.1)

## 2021-05-17 LAB — RETICULOCYTES
Immature Retic Fract: 17 % — ABNORMAL HIGH (ref 2.3–15.9)
RBC.: 3.6 MIL/uL — ABNORMAL LOW (ref 3.87–5.11)
Retic Count, Absolute: 61.2 10*3/uL (ref 19.0–186.0)
Retic Ct Pct: 1.7 % (ref 0.4–3.1)

## 2021-05-17 LAB — CBC WITH DIFFERENTIAL (CANCER CENTER ONLY)
Abs Immature Granulocytes: 0.01 10*3/uL (ref 0.00–0.07)
Basophils Absolute: 0.1 10*3/uL (ref 0.0–0.1)
Basophils Relative: 1 %
Eosinophils Absolute: 0.2 10*3/uL (ref 0.0–0.5)
Eosinophils Relative: 4 %
HCT: 32.3 % — ABNORMAL LOW (ref 36.0–46.0)
Hemoglobin: 10.2 g/dL — ABNORMAL LOW (ref 12.0–15.0)
Immature Granulocytes: 0 %
Lymphocytes Relative: 45 %
Lymphs Abs: 2.8 10*3/uL (ref 0.7–4.0)
MCH: 28.1 pg (ref 26.0–34.0)
MCHC: 31.6 g/dL (ref 30.0–36.0)
MCV: 89 fL (ref 80.0–100.0)
Monocytes Absolute: 1.1 10*3/uL — ABNORMAL HIGH (ref 0.1–1.0)
Monocytes Relative: 18 %
Neutro Abs: 2 10*3/uL (ref 1.7–7.7)
Neutrophils Relative %: 32 %
Platelet Count: 299 10*3/uL (ref 150–400)
RBC: 3.63 MIL/uL — ABNORMAL LOW (ref 3.87–5.11)
RDW: 21.9 % — ABNORMAL HIGH (ref 11.5–15.5)
WBC Count: 6.3 10*3/uL (ref 4.0–10.5)
nRBC: 0 % (ref 0.0–0.2)

## 2021-05-17 LAB — VITAMIN B12: Vitamin B-12: 573 pg/mL (ref 180–914)

## 2021-05-17 NOTE — Progress Notes (Signed)
Hematology and Oncology Follow Up Visit  Kathleen Pace [id number] 05/29/1941 80 y.o. 05/17/2021   Principle Diagnosis:  Chronic immune thrombocytopenia, remission. 2. Pernicious anemia. 3. Vitiligo. 4. Rheumatoid Arthritis 5. Iron Deficiency Anemia  Current Therapy:   Vitamin B12 1 mg IM Q. month  IV Iron as needed -- ferrlicit given on [id number]     Interim History:  Kathleen Pace is back for follow-up.  She clearly has marked iron deficiency anemia.  When we last saw her, her ferritin was 24 with an iron saturation of 7%..  She clearly is GI bleeding.  We have checked her for this.  She is yet to see her gastroenterologist.  She really needs to see the gastroenterologist.  She promises that she will call us with his name.  We will call him so we can try to get her set up for upper and lower endoscopy.  We are also sending off a erythropoietin level on her.  The last one we did was 10 years ago.  It was low.  As such, she may need to have all ESA.  She still says her stools are black.  She has had no cough.  She recently has had COVID.  She had a congestion.  She did not have to go to the hospital.  She has had no rashes.  There is been no leg swelling.  She does have vitiligo.  She has several autoimmune issues.  Currently, I would have to say her performance status is ECOG 2.    Medications:  Current Outpatient Medications:    acetaminophen (TYLENOL) 325 MG tablet, Take 650 mg by mouth every 6 (six) hours as needed., Disp: , Rfl:    amLODipine (NORVASC) 10 MG tablet, Take 10 mg by mouth daily., Disp: , Rfl:    aspirin 81 MG EC tablet, Take 1 tablet by mouth daily., Disp: , Rfl:    atorvastatin (LIPITOR) 80 MG tablet, SMARTSIG:1 Tablet(s) By Mouth Every Evening, Disp: , Rfl:    benzonatate (TESSALON) 100 MG capsule, Take 100 mg by mouth 3 (three) times daily as needed., Disp: , Rfl:    CALCIUM-VITAMIN D PO, Take 1 capsule by mouth every morning. Calcium 600 mg  Vitamin D  200, Disp: , Rfl:    Cholecalciferol (VITAMIN D) 2000 UNITS tablet, Take 2,000 Units by mouth daily., Disp: , Rfl:    clopidogrel (PLAVIX) 75 MG tablet, Take 75 mg by mouth daily., Disp: , Rfl:    cyanocobalamin (,VITAMIN B-12,) 1000 MCG/ML injection, Inject 1 cc every month into your muscle, Disp: 3 mL, Rfl: 24   cycloSPORINE (RESTASIS) 0.05 % ophthalmic emulsion, Place 1 drop into both eyes 2 times daily., Disp: , Rfl:    esomeprazole (NEXIUM) 20 MG capsule, Take 20 mg by mouth daily at 12 noon., Disp: , Rfl:    hydroxychloroquine (PLAQUENIL) 200 MG tablet, 200 mg daily. , Disp: , Rfl:    ipratropium (ATROVENT) 0.06 % nasal spray, Place into the nose., Disp: , Rfl:    leflunomide (ARAVA) 10 MG tablet, Take 10 mg by mouth daily. Take 1 and 1/2 tablet total of 15 mg daily., Disp: , Rfl:    levothyroxine (SYNTHROID, LEVOTHROID) 125 MCG tablet, 125 mcg daily. 08/31/2019 One day each week, will increase to 1.5 tabs., Disp: , Rfl:    metoprolol succinate (TOPROL-XL) 25 MG 24 hr tablet, Take 25 mg by mouth daily., Disp: , Rfl:    sertraline (ZOLOFT) 50 MG tablet, Take 50 mg  by mouth daily., Disp: , Rfl:    umeclidinium-vilanterol (ANORO ELLIPTA) 62.5-25 MCG/INH AEPB, Inhale into the lungs daily. , Disp: , Rfl:    valsartan (DIOVAN) 320 MG tablet, Take by mouth daily. , Disp: , Rfl:    albuterol (PROVENTIL HFA;VENTOLIN HFA) 108 (90 Base) MCG/ACT inhaler, Inhale into the lungs. (Patient not taking: No sig reported), Disp: , Rfl:    cephALEXin (KEFLEX) 500 MG capsule, Take 500 mg by mouth as needed. (Patient not taking: No sig reported), Disp: , Rfl:    furosemide (LASIX) 20 MG tablet, Take 20 mg by mouth daily. (Patient not taking: Reported on 05/17/2021), Disp: , Rfl:    prednisoLONE acetate (PRED FORTE) 1 % ophthalmic suspension, Place 1 drop into both eyes daily. (Patient not taking: Reported on 05/17/2021), Disp: , Rfl:   Allergies:  Allergies  Allergen Reactions   Iodinated Diagnostic Agents  Swelling   Sulfa Antibiotics Other (See Comments)    Drops Blood Pressure   Spironolactone Rash    Rash with itching unresponsive to benadryl.    Methotrexate Other (See Comments)   Doxycycline Nausea Only   Penicillins Rash    Patient tolerated cefazolin on 01/10/21   Quinine Derivatives Other (See Comments)    Patient doesn't remember reaction    Past Medical History, Surgical history, Social history, and Family History were reviewed and updated.  Review of Systems: Review of Systems  Constitutional:  Positive for malaise/fatigue.  HENT:  Positive for congestion.   Eyes: Negative.   Respiratory:  Positive for cough.   Cardiovascular:  Positive for palpitations.  Gastrointestinal:  Positive for diarrhea and nausea.  Genitourinary: Negative.   Musculoskeletal:  Positive for joint pain and myalgias.  Skin: Negative.   Neurological: Negative.   Endo/Heme/Allergies: Negative.   Psychiatric/Behavioral: Negative.      Physical Exam:  weight is 101 lb 1.9 oz (45.9 kg). Her oral temperature is 97.8 F (36.6 C). Her blood pressure is 103/50 (abnormal) and her pulse is 75. Her respiration is 24 (abnormal) and oxygen saturation is 98%.   Physical Exam Vitals reviewed.  HENT:     Head: Normocephalic and atraumatic.  Eyes:     Pupils: Pupils are equal, round, and reactive to light.  Cardiovascular:     Rate and Rhythm: Normal rate and regular rhythm.     Heart sounds: Normal heart sounds.  Pulmonary:     Effort: Pulmonary effort is normal.     Breath sounds: Normal breath sounds.  Abdominal:     General: Bowel sounds are normal.     Palpations: Abdomen is soft.  Musculoskeletal:        General: No tenderness or deformity. Normal range of motion.     Cervical back: Normal range of motion.  Lymphadenopathy:     Cervical: No cervical adenopathy.  Skin:    General: Skin is warm and dry.     Findings: No erythema or rash.  Neurological:     Mental Status: She is alert and  oriented to person, place, and time.  Psychiatric:        Behavior: Behavior normal.        Thought Content: Thought content normal.        Judgment: Judgment normal.     Lab Results  Component Value Date   WBC 6.3 05/17/2021   HGB 10.2 (L) 05/17/2021   HCT 32.3 (L) 05/17/2021   MCV 89.0 05/17/2021   PLT 299 05/17/2021     Chemistry  Component Value Date/Time   NA 139 05/17/2021 1210   NA 144 09/11/2017 1335   NA 138 07/31/2016 0947   K 4.2 05/17/2021 1210   K 4.2 09/11/2017 1335   K 4.3 07/31/2016 0947   CL 101 05/17/2021 1210   CL 104 09/11/2017 1335   CO2 30 05/17/2021 1210   CO2 28 09/11/2017 1335   CO2 27 07/31/2016 0947   BUN 15 05/17/2021 1210   BUN 17 09/11/2017 1335   BUN 14.7 07/31/2016 0947   CREATININE 0.61 05/17/2021 1210   CREATININE 0.9 09/11/2017 1335   CREATININE 0.7 07/31/2016 0947      Component Value Date/Time   CALCIUM 9.6 05/17/2021 1210   CALCIUM 9.0 09/11/2017 1335   CALCIUM 9.2 07/31/2016 0947   ALKPHOS 132 (H) 05/17/2021 1210   ALKPHOS 102 (H) 09/11/2017 1335   ALKPHOS 70 07/31/2016 0947   AST 30 05/17/2021 1210   AST 18 07/31/2016 0947   ALT 18 05/17/2021 1210   ALT 20 09/11/2017 1335   ALT 9 07/31/2016 0947   BILITOT 0.4 05/17/2021 1210   BILITOT 0.62 07/31/2016 0947      Impression and Plan: Kathleen Pace is 80 year old white female. She has a clear autoimmune issue. She has pernicious anemia. She has vitiligo. She has hypo-thyroidism.  She is on Plaquenil.  Her problem now is iron deficiency anemi.  We will see what her iron studies look like.  I have to believe that they are going to be on the low side.  Hopefully, she will call us back about her gastroenterologist.  I would like to see her back in about 4 weeks or so.  Hopefully, by then she would have had upper and lower endoscopies and we can see where she is bleeding from.   Volanda Napoleon, MD 7/29/20221:13 PM

## 2021-05-20 ENCOUNTER — Telehealth: Payer: Self-pay

## 2021-05-20 ENCOUNTER — Telehealth: Payer: Self-pay | Admitting: *Deleted

## 2021-05-20 LAB — IRON AND TIBC
Iron: 27 ug/dL — ABNORMAL LOW (ref 41–142)
Saturation Ratios: 7 % — ABNORMAL LOW (ref 21–57)
TIBC: 361 ug/dL (ref 236–444)
UIBC: 334 ug/dL (ref 120–384)

## 2021-05-20 LAB — FERRITIN: Ferritin: 43 ng/mL (ref 11–307)

## 2021-05-20 NOTE — Telephone Encounter (Signed)
-----   Message from Volanda Napoleon, MD sent at 05/20/2021  9:38 AM EDT ----- Please call and tell her that the iron studies are still very low.  Please set her up with IV iron again.    She MUST have a endoscopy done.  Please call her and find out who her gastroenterologist is.  She was supposed to call us about this.  [name]

## 2021-05-20 NOTE — Telephone Encounter (Signed)
As noted below by Dr. Marin Olp, I informed the patient that her iron studies are still low and she needs to get more IV iron. Also, you must have an endoscopy done. She stated,"I've never had one done. My GI doctor is Dr. Lake Bells, [address], [place] [address]. Office number is  [phone]." She verbalized understanding.

## 2021-05-27 ENCOUNTER — Other Ambulatory Visit: Payer: Self-pay

## 2021-05-27 ENCOUNTER — Inpatient Hospital Stay: Payer: Medicare Other | Attending: Hematology & Oncology

## 2021-05-27 VITALS — BP 165/65 | HR 64 | Temp 97.6°F | Resp 18

## 2021-05-27 DIAGNOSIS — M069 Rheumatoid arthritis, unspecified: Secondary | ICD-10-CM | POA: Insufficient documentation

## 2021-05-27 DIAGNOSIS — D510 Vitamin B12 deficiency anemia due to intrinsic factor deficiency: Secondary | ICD-10-CM | POA: Diagnosis not present

## 2021-05-27 DIAGNOSIS — D509 Iron deficiency anemia, unspecified: Secondary | ICD-10-CM | POA: Insufficient documentation

## 2021-05-27 DIAGNOSIS — L80 Vitiligo: Secondary | ICD-10-CM | POA: Diagnosis not present

## 2021-05-27 DIAGNOSIS — Z79899 Other long term (current) drug therapy: Secondary | ICD-10-CM | POA: Diagnosis not present

## 2021-05-27 DIAGNOSIS — E039 Hypothyroidism, unspecified: Secondary | ICD-10-CM | POA: Insufficient documentation

## 2021-05-27 DIAGNOSIS — Z7982 Long term (current) use of aspirin: Secondary | ICD-10-CM | POA: Diagnosis not present

## 2021-05-27 DIAGNOSIS — D693 Immune thrombocytopenic purpura: Secondary | ICD-10-CM | POA: Diagnosis not present

## 2021-05-27 DIAGNOSIS — E538 Deficiency of other specified B group vitamins: Secondary | ICD-10-CM | POA: Diagnosis not present

## 2021-05-27 DIAGNOSIS — D500 Iron deficiency anemia secondary to blood loss (chronic): Secondary | ICD-10-CM | POA: Diagnosis present

## 2021-05-27 MED ORDER — METHYLPREDNISOLONE SODIUM SUCC 125 MG IJ SOLR
INTRAMUSCULAR | Status: AC
  Filled 2021-05-27: qty 2

## 2021-05-27 MED ORDER — FAMOTIDINE 20 MG IN NS 100 ML IVPB
20.0000 mg | Freq: Once | INTRAVENOUS | Status: AC
  Administered 2021-05-27: 20 mg via INTRAVENOUS
  Filled 2021-05-27: qty 100

## 2021-05-27 MED ORDER — SODIUM CHLORIDE 0.9 % IV SOLN
125.0000 mg | Freq: Once | INTRAVENOUS | Status: AC
  Administered 2021-05-27: 125 mg via INTRAVENOUS
  Filled 2021-05-27: qty 125

## 2021-05-27 MED ORDER — SODIUM CHLORIDE 0.9 % IV SOLN
Freq: Once | INTRAVENOUS | Status: AC
  Filled 2021-05-27: qty 250

## 2021-05-27 MED ORDER — METHYLPREDNISOLONE SODIUM SUCC 125 MG IJ SOLR
60.0000 mg | Freq: Once | INTRAMUSCULAR | Status: AC
  Administered 2021-05-27: 60 mg via INTRAVENOUS

## 2021-05-27 NOTE — Patient Instructions (Signed)
Sodium Ferric Gluconate Complex injection What is this medication? SODIUM FERRIC GLUCONATE COMPLEX (SOE dee um FER ik GLOO koe nate KOM pleks) is an iron replacement. It is used with epoetin therapy to treat low iron levelsin patients who are receiving hemodialysis. This medicine may be used for other purposes; ask your health care provider orpharmacist if you have questions. COMMON BRAND NAME(S): Ferrlecit, Nulecit What should I tell my care team before I take this medication? They need to know if you have any of the following conditions: anemia that is not from iron deficiency high levels of iron in the body an unusual or allergic reaction to iron, benzyl alcohol, other medicines, foods, dyes, or preservatives pregnant or are trying to become pregnant breast-feeding How should I use this medication? This medicine is for infusion into a vein. It is given by a health careprofessional in a hospital or clinic setting. Talk to your pediatrician regarding the use of this medicine in children. While this drug may be prescribed for children as young as 6 years old for selectedconditions, precautions do apply. Overdosage: If you think you have taken too much of this medicine contact apoison control center or emergency room at once. NOTE: This medicine is only for you. Do not share this medicine with others. What if I miss a dose? It is important not to miss your dose. Call your doctor or health careprofessional if you are unable to keep an appointment. What may interact with this medication? Do not take this medicine with any of the following medications: deferoxamine dimercaprol other iron products This medicine may also interact with the following medications: chloramphenicol deferasirox medicine for blood pressure like enalapril This list may not describe all possible interactions. Give your health care provider a list of all the medicines, herbs, non-prescription drugs, or dietary  supplements you use. Also tell them if you smoke, drink alcohol, or use illegaldrugs. Some items may interact with your medicine. What should I watch for while using this medication? Your condition will be monitored carefully while you are receiving thismedicine. Visit your doctor for check-ups as directed. What side effects may I notice from receiving this medication? Side effects that you should report to your doctor or health care professionalas soon as possible: allergic reactions like skin rash, itching or hives, swelling of the face, lips, or tongue breathing problems changes in hearing changes in vision chills, flushing, or sweating fast, irregular heartbeat feeling faint or lightheaded, falls fever, flu-like symptoms high or low blood pressure pain, tingling, numbness in the hands or feet severe pain in the chest, back, flanks, or groin swelling of the ankles, feet, hands trouble passing urine or change in the amount of urine unusually weak or tired Side effects that usually do not require medical attention (report to yourdoctor or health care professional if they continue or are bothersome): cramps dark colored stools diarrhea headache nausea, vomiting stomach upset This list may not describe all possible side effects. Call your doctor for medical advice about side effects. You may report side effects to FDA at1-800-FDA-1088. Where should I keep my medication? This drug is given in a hospital or clinic and will not be stored at home. NOTE: This sheet is a summary. It may not cover all possible information. If you have questions about this medicine, talk to your doctor, pharmacist, orhealth care provider.  2022 Elsevier/Gold Standard (2008-06-07 15:58:57)  

## 2021-05-30 ENCOUNTER — Other Ambulatory Visit: Payer: Self-pay

## 2021-05-30 ENCOUNTER — Inpatient Hospital Stay: Payer: Medicare Other

## 2021-05-30 VITALS — BP 170/56 | HR 67 | Temp 97.6°F | Resp 17

## 2021-05-30 DIAGNOSIS — D500 Iron deficiency anemia secondary to blood loss (chronic): Secondary | ICD-10-CM

## 2021-05-30 MED ORDER — SODIUM CHLORIDE 0.9 % IV SOLN
125.0000 mg | Freq: Once | INTRAVENOUS | Status: AC
  Administered 2021-05-30: 125 mg via INTRAVENOUS
  Filled 2021-05-30: qty 125

## 2021-05-30 MED ORDER — SODIUM CHLORIDE 0.9 % IV SOLN
Freq: Once | INTRAVENOUS | Status: AC
  Filled 2021-05-30: qty 250

## 2021-05-30 MED ORDER — METHYLPREDNISOLONE SODIUM SUCC 125 MG IJ SOLR
INTRAMUSCULAR | Status: AC
  Filled 2021-05-30: qty 2

## 2021-05-30 MED ORDER — METHYLPREDNISOLONE SODIUM SUCC 125 MG IJ SOLR
60.0000 mg | Freq: Once | INTRAMUSCULAR | Status: AC
Start: 2021-05-30 — End: 2021-05-30
  Administered 2021-05-30: 60 mg via INTRAVENOUS

## 2021-05-30 MED ORDER — FAMOTIDINE 20 MG IN NS 100 ML IVPB
20.0000 mg | Freq: Once | INTRAVENOUS | Status: AC
  Administered 2021-05-30: 20 mg via INTRAVENOUS
  Filled 2021-05-30: qty 100

## 2021-05-30 MED ORDER — FAMOTIDINE IN NACL 20-0.9 MG/50ML-% IV SOLN: 20.0000 mg | Freq: Once | INTRAVENOUS | Status: DC

## 2021-05-30 NOTE — Patient Instructions (Signed)

## 2021-05-30 NOTE — Progress Notes (Signed)
Patient declined to stay for the post infusion observation period. Patient denies any difficulty with this infusion in the past and is aware to call with any questions or concerns.   Pt verbalized understanding and had no further questions today.   

## 2021-06-06 ENCOUNTER — Inpatient Hospital Stay: Payer: Medicare Other

## 2021-06-06 ENCOUNTER — Other Ambulatory Visit: Payer: Self-pay

## 2021-06-06 VITALS — BP 148/50 | HR 62 | Temp 97.5°F | Resp 17

## 2021-06-06 DIAGNOSIS — D500 Iron deficiency anemia secondary to blood loss (chronic): Secondary | ICD-10-CM

## 2021-06-06 MED ORDER — FAMOTIDINE 20 MG IN NS 100 ML IVPB
20.0000 mg | Freq: Once | INTRAVENOUS | Status: AC
  Administered 2021-06-06: 20 mg via INTRAVENOUS
  Filled 2021-06-06: qty 20

## 2021-06-06 MED ORDER — SODIUM CHLORIDE 0.9 % IV SOLN: Freq: Once | INTRAVENOUS | Status: AC

## 2021-06-06 MED ORDER — SODIUM CHLORIDE 0.9 % IV SOLN
125.0000 mg | Freq: Once | INTRAVENOUS | Status: AC
  Administered 2021-06-06: 125 mg via INTRAVENOUS
  Filled 2021-06-06: qty 125

## 2021-06-06 MED ORDER — METHYLPREDNISOLONE SODIUM SUCC 125 MG IJ SOLR
60.0000 mg | Freq: Once | INTRAMUSCULAR | Status: AC
  Administered 2021-06-06: 60 mg via INTRAVENOUS

## 2021-06-06 MED ORDER — METHYLPREDNISOLONE SODIUM SUCC 125 MG IJ SOLR
INTRAMUSCULAR | Status: AC
  Administered 2021-06-06: 60 mg via INTRAVENOUS
  Filled 2021-06-06: qty 2

## 2021-06-06 NOTE — Progress Notes (Signed)
Pt declined to stay for post infusion observation period. Pt stated she has tolerated medication multiple times prior without difficulty. Pt aware to call clinic with any questions or concerns. Pt verbalized understanding and had no further questions.  ? ?

## 2021-06-06 NOTE — Patient Instructions (Signed)
Sodium Ferric Gluconate Complex injection What is this medicine? SODIUM FERRIC GLUCONATE COMPLEX (SOE dee um FER ik GLOO koe nate KOM pleks) is an iron replacement. It is used with epoetin therapy to treat low iron levels in patients who are receiving hemodialysis. This medicine may be used for other purposes; ask your health care provider or pharmacist if you have questions. COMMON BRAND NAME(S): Ferrlecit, Nulecit What should I tell my health care provider before I take this medicine? They need to know if you have any of the following conditions:  anemia that is not from iron deficiency  high levels of iron in the body  an unusual or allergic reaction to iron, benzyl alcohol, other medicines, foods, dyes, or preservatives  pregnant or are trying to become pregnant  breast-feeding How should I use this medicine? This medicine is for infusion into a vein. It is given by a health care professional in a hospital or clinic setting. Talk to your pediatrician regarding the use of this medicine in children. While this drug may be prescribed for children as young as 6 years old for selected conditions, precautions do apply. Overdosage: If you think you have taken too much of this medicine contact a poison control center or emergency room at once. NOTE: This medicine is only for you. Do not share this medicine with others. What if I miss a dose? It is important not to miss your dose. Call your doctor or health care professional if you are unable to keep an appointment. What may interact with this medicine? Do not take this medicine with any of the following medications:  deferoxamine  dimercaprol  other iron products This medicine may also interact with the following medications:  chloramphenicol  deferasirox  medicine for blood pressure like enalapril This list may not describe all possible interactions. Give your health care provider a list of all the medicines, herbs,  non-prescription drugs, or dietary supplements you use. Also tell them if you smoke, drink alcohol, or use illegal drugs. Some items may interact with your medicine. What should I watch for while using this medicine? Your condition will be monitored carefully while you are receiving this medicine. Visit your doctor for check-ups as directed. What side effects may I notice from receiving this medicine? Side effects that you should report to your doctor or health care professional as soon as possible:  allergic reactions like skin rash, itching or hives, swelling of the face, lips, or tongue  breathing problems  changes in hearing  changes in vision  chills, flushing, or sweating  fast, irregular heartbeat  feeling faint or lightheaded, falls  fever, flu-like symptoms  high or low blood pressure  pain, tingling, numbness in the hands or feet  severe pain in the chest, back, flanks, or groin  swelling of the ankles, feet, hands  trouble passing urine or change in the amount of urine  unusually weak or tired Side effects that usually do not require medical attention (report to your doctor or health care professional if they continue or are bothersome):  cramps  dark colored stools  diarrhea  headache  nausea, vomiting  stomach upset This list may not describe all possible side effects. Call your doctor for medical advice about side effects. You may report side effects to FDA at 1-800-FDA-1088. Where should I keep my medicine? This drug is given in a hospital or clinic and will not be stored at home. NOTE: This sheet is a summary. It may not cover all   possible information. If you have questions about this medicine, talk to your doctor, pharmacist, or health care provider.  2021 Elsevier/Gold Standard (2008-06-07 15:58:57)   

## 2021-06-17 ENCOUNTER — Inpatient Hospital Stay: Payer: Medicare Other

## 2021-06-17 ENCOUNTER — Telehealth: Payer: Self-pay | Admitting: *Deleted

## 2021-06-17 ENCOUNTER — Encounter: Payer: Self-pay | Admitting: Hematology & Oncology

## 2021-06-17 ENCOUNTER — Other Ambulatory Visit: Payer: Self-pay

## 2021-06-17 ENCOUNTER — Inpatient Hospital Stay: Payer: Medicare Other | Admitting: Hematology & Oncology

## 2021-06-17 VITALS — BP 93/50 | HR 73 | Temp 98.0°F | Resp 18 | Wt 102.1 lb

## 2021-06-17 DIAGNOSIS — D500 Iron deficiency anemia secondary to blood loss (chronic): Secondary | ICD-10-CM

## 2021-06-17 LAB — CMP (CANCER CENTER ONLY)
ALT: 20 U/L (ref 0–44)
AST: 33 U/L (ref 15–41)
Albumin: 4 g/dL (ref 3.5–5.0)
Alkaline Phosphatase: 98 U/L (ref 38–126)
Anion gap: 10 (ref 5–15)
BUN: 16 mg/dL (ref 8–23)
CO2: 26 mmol/L (ref 22–32)
Calcium: 9.3 mg/dL (ref 8.9–10.3)
Chloride: 102 mmol/L (ref 98–111)
Creatinine: 0.86 mg/dL (ref 0.44–1.00)
GFR, Estimated: >60 mL/min (ref >60)
Glucose, Bld: 94 mg/dL (ref 70–99)
Potassium: 4.4 mmol/L (ref 3.5–5.1)
Sodium: 138 mmol/L (ref 135–145)
Total Bilirubin: 0.6 mg/dL (ref 0.3–1.2)
Total Protein: 6.7 g/dL (ref 6.5–8.1)

## 2021-06-17 LAB — CBC WITH DIFFERENTIAL (CANCER CENTER ONLY)
Abs Immature Granulocytes: 0.03 10*3/uL (ref 0.00–0.07)
Basophils Absolute: 0.1 10*3/uL (ref 0.0–0.1)
Basophils Relative: 2 %
Eosinophils Absolute: 0.1 10*3/uL (ref 0.0–0.5)
Eosinophils Relative: 3 %
HCT: 33.6 % — ABNORMAL LOW (ref 36.0–46.0)
Hemoglobin: 10.4 g/dL — ABNORMAL LOW (ref 12.0–15.0)
Immature Granulocytes: 1 %
Lymphocytes Relative: 50 %
Lymphs Abs: 2.6 10*3/uL (ref 0.7–4.0)
MCH: 29.1 pg (ref 26.0–34.0)
MCHC: 31 g/dL (ref 30.0–36.0)
MCV: 94.1 fL (ref 80.0–100.0)
Monocytes Absolute: 1.1 10*3/uL — ABNORMAL HIGH (ref 0.1–1.0)
Monocytes Relative: 22 %
Neutro Abs: 1.1 10*3/uL — ABNORMAL LOW (ref 1.7–7.7)
Neutrophils Relative %: 22 %
Platelet Count: 339 10*3/uL (ref 150–400)
RBC: 3.57 MIL/uL — ABNORMAL LOW (ref 3.87–5.11)
RDW: 23.6 % — ABNORMAL HIGH (ref 11.5–15.5)
WBC Count: 5.1 10*3/uL (ref 4.0–10.5)
nRBC: 0 % (ref 0.0–0.2)

## 2021-06-17 LAB — RETICULOCYTES
Immature Retic Fract: 19.9 % — ABNORMAL HIGH (ref 2.3–15.9)
RBC.: 3.55 MIL/uL — ABNORMAL LOW (ref 3.87–5.11)
Retic Count, Absolute: 86.6 10*3/uL (ref 19.0–186.0)
Retic Ct Pct: 2.4 % (ref 0.4–3.1)

## 2021-06-17 NOTE — Telephone Encounter (Signed)
Per 06/17/21 los - gave patient upcoming appointment - confirmed - print calendar

## 2021-06-17 NOTE — Progress Notes (Signed)
Hematology and Oncology Follow Up Visit  Kathleen Pace [id number] 1941/05/27 80 y.o. 06/17/2021   Principle Diagnosis:  Chronic immune thrombocytopenia, remission. 2. Pernicious anemia. 3. Vitiligo. 4. Rheumatoid Arthritis 5. Iron Deficiency Anemia  Current Therapy:   Vitamin B12 1 mg IM Q. month  IV Iron as needed -- ferrlicit given on [id number]     Interim History:  Kathleen Pace is back for follow-up.  She does feel a little bit better.  We have given her IV iron.  We last saw her, her iron saturation was only 7%.  She tells that she is not going to be able to have an endoscopy for another 3 months.  I am not sure as to why this is taking so long.  I will have to try to speak to the gastroenterologist about this.  She did have a nice weekend.  It was her 62nd wedding anniversary.  She had a wonderful time.    There has been no problems with her heart.  She has had no issues with her bowels.  There is been no headache.  She has had no rashes.  There is been no leg swelling.  She had COVID I think about 6 weeks or so ago.  I think she has gone through this.  There is been no problems with her vitamin B 12.  Only last saw her, the level of B12 was 573.  She has had no problems with headaches.  Overall, I would have to say that her performance status is ECOG 1.     Medications:  Current Outpatient Medications:    acetaminophen (TYLENOL) 325 MG tablet, Take 650 mg by mouth every 6 (six) hours as needed., Disp: , Rfl:    albuterol (PROVENTIL HFA;VENTOLIN HFA) 108 (90 Base) MCG/ACT inhaler, Inhale into the lungs., Disp: , Rfl:    amLODipine (NORVASC) 10 MG tablet, Take 10 mg by mouth daily., Disp: , Rfl:    aspirin 81 MG EC tablet, Take 1 tablet by mouth daily., Disp: , Rfl:    atorvastatin (LIPITOR) 80 MG tablet, SMARTSIG:1 Tablet(s) By Mouth Every Evening, Disp: , Rfl:    cephALEXin (KEFLEX) 500 MG capsule, Take 500 mg by mouth as needed., Disp: , Rfl:    Cholecalciferol  (VITAMIN D) 2000 UNITS tablet, Take 2,000 Units by mouth daily., Disp: , Rfl:    clopidogrel (PLAVIX) 75 MG tablet, Take 75 mg by mouth daily., Disp: , Rfl:    cyanocobalamin (,VITAMIN B-12,) 1000 MCG/ML injection, Inject 1 cc every month into your muscle, Disp: 3 mL, Rfl: 24   cycloSPORINE (RESTASIS) 0.05 % ophthalmic emulsion, Place 1 drop into both eyes 2 times daily., Disp: , Rfl:    esomeprazole (NEXIUM) 20 MG capsule, Take 20 mg by mouth daily at 12 noon., Disp: , Rfl:    furosemide (LASIX) 20 MG tablet, Take 20 mg by mouth daily., Disp: , Rfl:    hydroxychloroquine (PLAQUENIL) 200 MG tablet, 200 mg daily. , Disp: , Rfl:    leflunomide (ARAVA) 10 MG tablet, Take 10 mg by mouth daily. Take 1 and 1/2 tablet total of 15 mg daily., Disp: , Rfl:    levothyroxine (SYNTHROID, LEVOTHROID) 125 MCG tablet, 125 mcg daily. 08/31/2019 One day each week, will increase to 1.5 tabs., Disp: , Rfl:    metoprolol succinate (TOPROL-XL) 25 MG 24 hr tablet, Take 25 mg by mouth daily., Disp: , Rfl:    prednisoLONE acetate (PRED FORTE) 1 % ophthalmic suspension, Place 1  drop into both eyes daily., Disp: , Rfl:    sertraline (ZOLOFT) 50 MG tablet, Take 50 mg by mouth daily., Disp: , Rfl:    umeclidinium-vilanterol (ANORO ELLIPTA) 62.5-25 MCG/INH AEPB, Inhale into the lungs daily. , Disp: , Rfl:    valsartan (DIOVAN) 320 MG tablet, Take by mouth daily. , Disp: , Rfl:    benzonatate (TESSALON) 100 MG capsule, Take 100 mg by mouth 3 (three) times daily as needed. (Patient not taking: Reported on 06/17/2021), Disp: , Rfl:    CALCIUM-VITAMIN D PO, Take 1 capsule by mouth every morning. Calcium 600 mg  Vitamin D 200 (Patient not taking: Reported on 06/17/2021), Disp: , Rfl:    ipratropium (ATROVENT) 0.06 % nasal spray, Place into the nose. (Patient not taking: Reported on 06/17/2021), Disp: , Rfl:   Allergies:  Allergies  Allergen Reactions   Iodinated Diagnostic Agents Swelling   Sulfa Antibiotics Other (See Comments)     Drops Blood Pressure   Spironolactone Rash    Rash with itching unresponsive to benadryl.    Methotrexate Other (See Comments)   Doxycycline Nausea Only   Penicillins Rash    Patient tolerated cefazolin on 01/10/21   Quinine Derivatives Other (See Comments)    Patient doesn't remember reaction    Past Medical History, Surgical history, Social history, and Family History were reviewed and updated.  Review of Systems: Review of Systems  Constitutional:  Positive for malaise/fatigue.  HENT:  Positive for congestion.   Eyes: Negative.   Respiratory:  Positive for cough.   Cardiovascular:  Positive for palpitations.  Gastrointestinal:  Positive for diarrhea and nausea.  Genitourinary: Negative.   Musculoskeletal:  Positive for joint pain and myalgias.  Skin: Negative.   Neurological: Negative.   Endo/Heme/Allergies: Negative.   Psychiatric/Behavioral: Negative.      Physical Exam:  weight is 102 lb 1.3 oz (46.3 kg). Her oral temperature is 98 F (36.7 C). Her blood pressure is 93/50 (abnormal) and her pulse is 73. Her respiration is 18.   Physical Exam Vitals reviewed.  HENT:     Head: Normocephalic and atraumatic.  Eyes:     Pupils: Pupils are equal, round, and reactive to light.  Cardiovascular:     Rate and Rhythm: Normal rate and regular rhythm.     Heart sounds: Normal heart sounds.  Pulmonary:     Effort: Pulmonary effort is normal.     Breath sounds: Normal breath sounds.  Abdominal:     General: Bowel sounds are normal.     Palpations: Abdomen is soft.  Musculoskeletal:        General: No tenderness or deformity. Normal range of motion.     Cervical back: Normal range of motion.  Lymphadenopathy:     Cervical: No cervical adenopathy.  Skin:    General: Skin is warm and dry.     Findings: No erythema or rash.  Neurological:     Mental Status: She is alert and oriented to person, place, and time.  Psychiatric:        Behavior: Behavior normal.         Thought Content: Thought content normal.        Judgment: Judgment normal.     Lab Results  Component Value Date   WBC 5.1 06/17/2021   HGB 10.4 (L) 06/17/2021   HCT 33.6 (L) 06/17/2021   MCV 94.1 06/17/2021   PLT 339 06/17/2021     Chemistry      Component Value Date/Time  NA 138 06/17/2021 1153   NA 144 09/11/2017 1335   NA 138 07/31/2016 0947   K 4.4 06/17/2021 1153   K 4.2 09/11/2017 1335   K 4.3 07/31/2016 0947   CL 102 06/17/2021 1153   CL 104 09/11/2017 1335   CO2 26 06/17/2021 1153   CO2 28 09/11/2017 1335   CO2 27 07/31/2016 0947   BUN 16 06/17/2021 1153   BUN 17 09/11/2017 1335   BUN 14.7 07/31/2016 0947   CREATININE 0.86 06/17/2021 1153   CREATININE 0.9 09/11/2017 1335   CREATININE 0.7 07/31/2016 0947      Component Value Date/Time   CALCIUM 9.3 06/17/2021 1153   CALCIUM 9.0 09/11/2017 1335   CALCIUM 9.2 07/31/2016 0947   ALKPHOS 98 06/17/2021 1153   ALKPHOS 102 (H) 09/11/2017 1335   ALKPHOS 70 07/31/2016 0947   AST 33 06/17/2021 1153   AST 18 07/31/2016 0947   ALT 20 06/17/2021 1153   ALT 20 09/11/2017 1335   ALT 9 07/31/2016 0947   BILITOT 0.6 06/17/2021 1153   BILITOT 0.62 07/31/2016 0947      Impression and Plan: Ms. Castles is 80 year old white female. She has a clear autoimmune issue. She has pernicious anemia. She has vitiligo. She has hypo-thyroidism.  She is on Plaquenil.  Her problem now is iron deficiency anemia.  We will see what her iron studies look like.  That should be better as her MCV is starting to come up finally.  We will have to see what her erythropoietin level is.  We may have to think about given her ESA.  I would like to see her back in about 4 months or so.  We will have to see about her gastroenterologist and maybe they can do an endoscopy limit sooner than November.     Volanda Napoleon, MD 8/29/20221:05 PM

## 2021-06-18 LAB — ERYTHROPOIETIN: Erythropoietin: 46.8 m[IU]/mL — ABNORMAL HIGH (ref 2.6–18.5)

## 2021-06-18 LAB — IRON AND TIBC
Iron: 28 ug/dL — ABNORMAL LOW (ref 41–142)
Saturation Ratios: 8 % — ABNORMAL LOW (ref 21–57)
TIBC: 365 ug/dL (ref 236–444)
UIBC: 337 ug/dL (ref 120–384)

## 2021-06-18 LAB — FERRITIN: Ferritin: 122 ng/mL (ref 11–307)

## 2021-07-25 ENCOUNTER — Other Ambulatory Visit: Payer: Medicare Other

## 2021-07-25 ENCOUNTER — Ambulatory Visit: Payer: Medicare Other | Admitting: Hematology & Oncology

## 2021-08-06 ENCOUNTER — Inpatient Hospital Stay: Payer: Medicare Other | Admitting: Hematology & Oncology

## 2021-08-06 ENCOUNTER — Inpatient Hospital Stay: Payer: Medicare Other

## 2021-08-06 ENCOUNTER — Other Ambulatory Visit: Payer: Self-pay

## 2021-09-06 ENCOUNTER — Inpatient Hospital Stay: Payer: Medicare Other | Attending: Hematology & Oncology

## 2021-09-06 ENCOUNTER — Other Ambulatory Visit: Payer: Self-pay | Admitting: *Deleted

## 2021-09-06 ENCOUNTER — Other Ambulatory Visit: Payer: Self-pay

## 2021-09-06 ENCOUNTER — Inpatient Hospital Stay: Payer: Medicare Other

## 2021-09-06 ENCOUNTER — Inpatient Hospital Stay (HOSPITAL_BASED_OUTPATIENT_CLINIC_OR_DEPARTMENT_OTHER): Payer: Medicare Other | Admitting: Hematology & Oncology

## 2021-09-06 ENCOUNTER — Encounter: Payer: Self-pay | Admitting: Hematology & Oncology

## 2021-09-06 ENCOUNTER — Ambulatory Visit: Payer: Medicare Other

## 2021-09-06 VITALS — BP 140/66 | HR 72 | Temp 97.7°F | Resp 24 | Wt 109.0 lb

## 2021-09-06 DIAGNOSIS — M069 Rheumatoid arthritis, unspecified: Secondary | ICD-10-CM | POA: Diagnosis not present

## 2021-09-06 DIAGNOSIS — D500 Iron deficiency anemia secondary to blood loss (chronic): Secondary | ICD-10-CM

## 2021-09-06 DIAGNOSIS — Z79899 Other long term (current) drug therapy: Secondary | ICD-10-CM | POA: Diagnosis not present

## 2021-09-06 DIAGNOSIS — K922 Gastrointestinal hemorrhage, unspecified: Secondary | ICD-10-CM | POA: Diagnosis not present

## 2021-09-06 DIAGNOSIS — L80 Vitiligo: Secondary | ICD-10-CM | POA: Insufficient documentation

## 2021-09-06 DIAGNOSIS — D510 Vitamin B12 deficiency anemia due to intrinsic factor deficiency: Secondary | ICD-10-CM | POA: Diagnosis not present

## 2021-09-06 LAB — CBC WITH DIFFERENTIAL (CANCER CENTER ONLY)
Abs Immature Granulocytes: 0.02 10*3/uL (ref 0.00–0.07)
Basophils Absolute: 0.1 10*3/uL (ref 0.0–0.1)
Basophils Relative: 2 %
Eosinophils Absolute: 0.2 10*3/uL (ref 0.0–0.5)
Eosinophils Relative: 3 %
HCT: 24.2 % — ABNORMAL LOW (ref 36.0–46.0)
Hemoglobin: 7.2 g/dL — ABNORMAL LOW (ref 12.0–15.0)
Immature Granulocytes: 0 %
Lymphocytes Relative: 24 %
Lymphs Abs: 1.4 10*3/uL (ref 0.7–4.0)
MCH: 25.4 pg — ABNORMAL LOW (ref 26.0–34.0)
MCHC: 29.8 g/dL — ABNORMAL LOW (ref 30.0–36.0)
MCV: 85.2 fL (ref 80.0–100.0)
Monocytes Absolute: 1.1 10*3/uL — ABNORMAL HIGH (ref 0.1–1.0)
Monocytes Relative: 19 %
Neutro Abs: 2.9 10*3/uL (ref 1.7–7.7)
Neutrophils Relative %: 52 %
Platelet Count: 254 10*3/uL (ref 150–400)
RBC: 2.84 MIL/uL — ABNORMAL LOW (ref 3.87–5.11)
RDW: 19.3 % — ABNORMAL HIGH (ref 11.5–15.5)
WBC Count: 5.7 10*3/uL (ref 4.0–10.5)
nRBC: 0.3 % — ABNORMAL HIGH (ref 0.0–0.2)

## 2021-09-06 LAB — CMP (CANCER CENTER ONLY)
ALT: 16 U/L (ref 0–44)
AST: 28 U/L (ref 15–41)
Albumin: 4 g/dL (ref 3.5–5.0)
Alkaline Phosphatase: 95 U/L (ref 38–126)
Anion gap: 9 (ref 5–15)
BUN: 18 mg/dL (ref 8–23)
CO2: 25 mmol/L (ref 22–32)
Calcium: 9.4 mg/dL (ref 8.9–10.3)
Chloride: 106 mmol/L (ref 98–111)
Creatinine: 0.85 mg/dL (ref 0.44–1.00)
GFR, Estimated: >60 mL/min (ref >60)
Glucose, Bld: 97 mg/dL (ref 70–99)
Potassium: 4.2 mmol/L (ref 3.5–5.1)
Sodium: 140 mmol/L (ref 135–145)
Total Bilirubin: 0.5 mg/dL (ref 0.3–1.2)
Total Protein: 6.3 g/dL — ABNORMAL LOW (ref 6.5–8.1)

## 2021-09-06 LAB — PREPARE RBC (CROSSMATCH)

## 2021-09-06 NOTE — Progress Notes (Signed)
Hematology and Oncology Follow Up Visit  Kathleen Pace [id number] 09-28-1941 80 y.o. 09/06/2021   Principle Diagnosis:  Chronic immune thrombocytopenia, remission. 2. Pernicious anemia. 3. Vitiligo. 4. Rheumatoid Arthritis 5. Iron Deficiency Anemia  Current Therapy:   Vitamin B12 1 mg IM Q. month  IV Iron as needed -- ferrlicit given on 20/25/4270     Interim History:  Ms.  Pace is back for follow-up.  Unfortunately, she is still bleeding.  Her hemoglobin is down to 7.2.  I am sure that her iron is going be low again.  She is supposed to have a upper endoscopy and a colonoscopy on 28 November.  She does feel tired.  This is been going on for a couple weeks.  She has not noted any obvious melena or bright red blood per rectum.  She has had no heart difficulty.  I think she did have her cardiac procedure a while ago.  She has had no weight loss or weight gain.  There is been no issues with leg swelling.  She has had no rashes.  Overall, her performance status is ECOG 1.   Medications:  Current Outpatient Medications:    acetaminophen (TYLENOL) 325 MG tablet, Take 650 mg by mouth every 6 (six) hours as needed., Disp: , Rfl:    albuterol (PROVENTIL HFA;VENTOLIN HFA) 108 (90 Base) MCG/ACT inhaler, Inhale 2 puffs into the lungs every 4 (four) hours as needed., Disp: , Rfl:    amLODipine (NORVASC) 10 MG tablet, Take 10 mg by mouth daily., Disp: , Rfl:    aspirin 81 MG EC tablet, Take 1 tablet by mouth daily., Disp: , Rfl:    atorvastatin (LIPITOR) 80 MG tablet, SMARTSIG:1 Tablet(s) By Mouth Every Evening, Disp: , Rfl:    Cholecalciferol (VITAMIN D) 2000 UNITS tablet, Take 2,000 Units by mouth daily., Disp: , Rfl:    clopidogrel (PLAVIX) 75 MG tablet, Take 75 mg by mouth daily., Disp: , Rfl:    cyanocobalamin (,VITAMIN B-12,) 1000 MCG/ML injection, Inject 1 cc every month into your muscle, Disp: 3 mL, Rfl: 24   cycloSPORINE (RESTASIS) 0.05 % ophthalmic emulsion, Place 1  drop into both eyes 2 times daily., Disp: , Rfl:    esomeprazole (NEXIUM) 20 MG capsule, Take 20 mg by mouth daily at 12 noon., Disp: , Rfl:    hydroxychloroquine (PLAQUENIL) 200 MG tablet, 200 mg daily. , Disp: , Rfl:    leflunomide (ARAVA) 10 MG tablet, Take 10 mg by mouth daily. Take 1 and 1/2 tablet total of 15 mg daily., Disp: , Rfl:    levothyroxine (SYNTHROID, LEVOTHROID) 125 MCG tablet, 125 mcg daily. 08/31/2019 One day each week, will increase to 1.5 tabs., Disp: , Rfl:    metoprolol succinate (TOPROL-XL) 25 MG 24 hr tablet, Take 25 mg by mouth daily., Disp: , Rfl:    prednisoLONE acetate (PRED FORTE) 1 % ophthalmic suspension, Place 1 drop into both eyes daily., Disp: , Rfl:    sertraline (ZOLOFT) 50 MG tablet, Take 50 mg by mouth daily., Disp: , Rfl:    umeclidinium-vilanterol (ANORO ELLIPTA) 62.5-25 MCG/INH AEPB, Inhale into the lungs daily. , Disp: , Rfl:    valsartan (DIOVAN) 320 MG tablet, Take by mouth daily. , Disp: , Rfl:    CALCIUM-VITAMIN D PO, Take 1 capsule by mouth every morning. Calcium 600 mg  Vitamin D 200 (Patient not taking: Reported on 06/17/2021), Disp: , Rfl:    furosemide (LASIX) 20 MG tablet, Take 20 mg by mouth daily. (  Patient not taking: Reported on 09/06/2021), Disp: , Rfl:    ipratropium (ATROVENT) 0.06 % nasal spray, Place into the nose. (Patient not taking: Reported on 06/17/2021), Disp: , Rfl:   Allergies:  Allergies  Allergen Reactions   Iodinated Diagnostic Agents Swelling   Sulfa Antibiotics Other (See Comments)    Drops Blood Pressure   Spironolactone Rash    Rash with itching unresponsive to benadryl.    Methotrexate Other (See Comments)   Doxycycline Nausea Only   Penicillins Rash    Patient tolerated cefazolin on 01/10/21   Quinine Derivatives Other (See Comments)    Patient doesn't remember reaction    Past Medical History, Surgical history, Social history, and Family History were reviewed and updated.  Review of Systems: Review of Systems   Constitutional:  Positive for malaise/fatigue.  HENT:  Positive for congestion.   Eyes: Negative.   Respiratory:  Positive for cough.   Cardiovascular:  Positive for palpitations.  Gastrointestinal:  Positive for diarrhea and nausea.  Genitourinary: Negative.   Musculoskeletal:  Positive for joint pain and myalgias.  Skin: Negative.   Neurological: Negative.   Endo/Heme/Allergies: Negative.   Psychiatric/Behavioral: Negative.      Physical Exam:  weight is 109 lb (49.4 kg). Her oral temperature is 97.7 F (36.5 C). Her blood pressure is 140/66 and her pulse is 72. Her respiration is 24 (abnormal) and oxygen saturation is 96%.   Physical Exam Vitals reviewed.  HENT:     Head: Normocephalic and atraumatic.  Eyes:     Pupils: Pupils are equal, round, and reactive to light.  Cardiovascular:     Rate and Rhythm: Normal rate and regular rhythm.     Heart sounds: Normal heart sounds.  Pulmonary:     Effort: Pulmonary effort is normal.     Breath sounds: Normal breath sounds.  Abdominal:     General: Bowel sounds are normal.     Palpations: Abdomen is soft.  Musculoskeletal:        General: No tenderness or deformity. Normal range of motion.     Cervical back: Normal range of motion.  Lymphadenopathy:     Cervical: No cervical adenopathy.  Skin:    General: Skin is warm and dry.     Findings: No erythema or rash.  Neurological:     Mental Status: She is alert and oriented to person, place, and time.  Psychiatric:        Behavior: Behavior normal.        Thought Content: Thought content normal.        Judgment: Judgment normal.     Lab Results  Component Value Date   WBC 5.7 09/06/2021   HGB 7.2 (L) 09/06/2021   HCT 24.2 (L) 09/06/2021   MCV 85.2 09/06/2021   PLT 254 09/06/2021     Chemistry      Component Value Date/Time   NA 140 09/06/2021 1128   NA 144 09/11/2017 1335   NA 138 07/31/2016 0947   K 4.2 09/06/2021 1128   K 4.2 09/11/2017 1335   K 4.3  07/31/2016 0947   CL 106 09/06/2021 1128   CL 104 09/11/2017 1335   CO2 25 09/06/2021 1128   CO2 28 09/11/2017 1335   CO2 27 07/31/2016 0947   BUN 18 09/06/2021 1128   BUN 17 09/11/2017 1335   BUN 14.7 07/31/2016 0947   CREATININE 0.85 09/06/2021 1128   CREATININE 0.9 09/11/2017 1335   CREATININE 0.7 07/31/2016 0947  Component Value Date/Time   CALCIUM 9.4 09/06/2021 1128   CALCIUM 9.0 09/11/2017 1335   CALCIUM 9.2 07/31/2016 0947   ALKPHOS 95 09/06/2021 1128   ALKPHOS 102 (H) 09/11/2017 1335   ALKPHOS 70 07/31/2016 0947   AST 28 09/06/2021 1128   AST 18 07/31/2016 0947   ALT 16 09/06/2021 1128   ALT 20 09/11/2017 1335   ALT 9 07/31/2016 0947   BILITOT 0.5 09/06/2021 1128   BILITOT 0.62 07/31/2016 0947      Impression and Plan: Ms. Kuklinski is 80 year old white female. She has a clear autoimmune issue. She has pernicious anemia. She has vitiligo. She has hypo-thyroidism.  She is on Plaquenil.  Her problem now is iron deficiency anemia.  She clearly has GI bleeding.  I am sure her iron is going be low again.  We will have to set her up with 2 units of blood.  She will get IV iron.  I am glad that she can have the endoscopy.  This will certainly help clear up where this GI bleeding is coming from.  We will have to see her back in probably 6 weeks.  Hopefully, we will see that her hemoglobin is improving.    Volanda Napoleon, MD 11/18/202212:30 PM

## 2021-09-09 ENCOUNTER — Other Ambulatory Visit: Payer: Self-pay

## 2021-09-09 ENCOUNTER — Inpatient Hospital Stay: Payer: Medicare Other

## 2021-09-09 DIAGNOSIS — D500 Iron deficiency anemia secondary to blood loss (chronic): Secondary | ICD-10-CM | POA: Diagnosis not present

## 2021-09-09 LAB — FERRITIN: Ferritin: 15 ng/mL (ref 11–307)

## 2021-09-09 LAB — IRON AND TIBC
Iron: 19 ug/dL — ABNORMAL LOW (ref 41–142)
Saturation Ratios: 5 % — ABNORMAL LOW (ref 21–57)
TIBC: 404 ug/dL (ref 236–444)
UIBC: 385 ug/dL — ABNORMAL HIGH (ref 120–384)

## 2021-09-09 MED ORDER — SODIUM CHLORIDE 0.9% IV SOLUTION
250.0000 mL | Freq: Once | INTRAVENOUS | Status: AC
  Administered 2021-09-09: 250 mL via INTRAVENOUS

## 2021-09-09 NOTE — Patient Instructions (Signed)
Blood Transfusion, Adult, Care After This sheet gives you information about how to care for yourself after your procedure. Your doctor may also give you more specific instructions. If you have problems or questions, contact your doctor. What can I expect after the procedure? After the procedure, it is common to have: Bruising and soreness at the IV site. A headache. Follow these instructions at home: Insertion site care   Follow instructions from your doctor about how to take care of your insertion site. This is where an IV tube was put into your vein. Make sure you: Wash your hands with soap and water before and after you change your bandage (dressing). If you cannot use soap and water, use hand sanitizer. Change your bandage as told by your doctor. Check your insertion site every day for signs of infection. Check for: Redness, swelling, or pain. Bleeding from the site. Warmth. Pus or a bad smell. General instructions Take over-the-counter and prescription medicines only as told by your doctor. Rest as told by your doctor. Go back to your normal activities as told by your doctor. Keep all follow-up visits as told by your doctor. This is important. Contact a doctor if: You have itching or red, swollen areas of skin (hives). You feel worried or nervous (anxious). You feel weak after doing your normal activities. You have redness, swelling, warmth, or pain around the insertion site. You have blood coming from the insertion site, and the blood does not stop with pressure. You have pus or a bad smell coming from the insertion site. Get help right away if: You have signs of a serious reaction. This may be coming from an allergy or the body's defense system (immune system). Signs include: Trouble breathing or shortness of breath. Swelling of the face or feeling warm (flushed). Fever or chills. Head, chest, or back pain. Dark pee (urine) or blood in the pee. Widespread rash. Fast  heartbeat. Feeling dizzy or light-headed. You may receive your blood transfusion in an outpatient setting. If so, you will be told whom to contact to report any reactions. These symptoms may be an emergency. Do not wait to see if the symptoms will go away. Get medical help right away. Call your local emergency services (911 in the [place] Do not drive yourself to the hospital. Summary Bruising and soreness at the IV site are common. Check your insertion site every day for signs of infection. Rest as told by your doctor. Go back to your normal activities as told by your doctor. Get help right away if you have signs of a serious reaction. This information is not intended to replace advice given to you by your health care provider. Make sure you discuss any questions you have with your health care provider. Document Revised: 01/31/2021 Document Reviewed: 03/31/2019 Elsevier Patient Education  2022 [employer]  

## 2021-09-09 NOTE — Progress Notes (Signed)
Patient states she did not take her BP medications this am. BP elevated after 2 units of p-rbc's given. Patient states she wants to take her medication at home, after discharge.

## 2021-09-10 LAB — BPAM RBC
Blood Product Expiration Date: 202212172359
Blood Product Expiration Date: 202212172359
ISSUE DATE / TIME: 202211210748
ISSUE DATE / TIME: 202211210748
Unit Type and Rh: 5100
Unit Type and Rh: 5100

## 2021-09-10 LAB — TYPE AND SCREEN
ABO/RH(D): O POS
Antibody Screen: NEGATIVE
Unit division: 0
Unit division: 0

## 2021-09-11 LAB — SAMPLE TO BLOOD BANK

## 2021-09-18 ENCOUNTER — Other Ambulatory Visit: Payer: Self-pay

## 2021-09-18 ENCOUNTER — Inpatient Hospital Stay: Payer: Medicare Other

## 2021-09-18 VITALS — BP 154/61 | HR 66 | Temp 98.2°F | Resp 17

## 2021-09-18 DIAGNOSIS — D500 Iron deficiency anemia secondary to blood loss (chronic): Secondary | ICD-10-CM | POA: Diagnosis not present

## 2021-09-18 MED ORDER — FAMOTIDINE 20 MG IN NS 100 ML IVPB
20.0000 mg | Freq: Once | INTRAVENOUS | Status: AC
  Administered 2021-09-18: 20 mg via INTRAVENOUS
  Filled 2021-09-18: qty 100

## 2021-09-18 MED ORDER — SODIUM CHLORIDE 0.9% FLUSH: 10.0000 mL | Freq: Once | INTRAVENOUS | Status: DC | PRN

## 2021-09-18 MED ORDER — FAMOTIDINE IN NACL 20-0.9 MG/50ML-% IV SOLN: 20.0000 mg | Freq: Once | INTRAVENOUS | Status: DC | PRN

## 2021-09-18 MED ORDER — SODIUM CHLORIDE 0.9 % IV SOLN
125.0000 mg | Freq: Once | INTRAVENOUS | Status: AC
  Administered 2021-09-18: 125 mg via INTRAVENOUS
  Filled 2021-09-18: qty 10

## 2021-09-18 MED ORDER — METHYLPREDNISOLONE SODIUM SUCC 125 MG IJ SOLR
60.0000 mg | Freq: Once | INTRAMUSCULAR | Status: AC
  Administered 2021-09-18: 60 mg via INTRAVENOUS
  Filled 2021-09-18: qty 2

## 2021-09-18 MED ORDER — ALTEPLASE 2 MG IJ SOLR: 2.0000 mg | Freq: Once | INTRAMUSCULAR | Status: DC | PRN

## 2021-09-18 MED ORDER — HEPARIN SOD (PORK) LOCK FLUSH 100 UNIT/ML IV SOLN: 250.0000 [IU] | Freq: Once | INTRAVENOUS | Status: DC | PRN

## 2021-09-18 MED ORDER — SODIUM CHLORIDE 0.9 % IV SOLN: Freq: Once | INTRAVENOUS | Status: AC

## 2021-09-18 MED ORDER — HEPARIN SOD (PORK) LOCK FLUSH 100 UNIT/ML IV SOLN: 500.0000 [IU] | Freq: Once | INTRAVENOUS | Status: DC | PRN

## 2021-09-18 MED ORDER — DIPHENHYDRAMINE HCL 50 MG/ML IJ SOLN: 50.0000 mg | Freq: Once | INTRAMUSCULAR | Status: DC | PRN

## 2021-09-18 MED ORDER — EPINEPHRINE 0.3 MG/0.3ML IJ SOAJ
0.3000 mg | Freq: Once | INTRAMUSCULAR | Status: DC | PRN
  Filled 2021-09-18: qty 0.6

## 2021-09-18 MED ORDER — METHYLPREDNISOLONE SODIUM SUCC 125 MG IJ SOLR: 125.0000 mg | Freq: Once | INTRAMUSCULAR | Status: DC | PRN

## 2021-09-18 MED ORDER — SODIUM CHLORIDE 0.9% FLUSH: 3.0000 mL | Freq: Once | INTRAVENOUS | Status: DC | PRN

## 2021-09-18 MED ORDER — ALBUTEROL SULFATE (2.5 MG/3ML) 0.083% IN NEBU: 2.5000 mg | INHALATION_SOLUTION | Freq: Once | RESPIRATORY_TRACT | Status: DC | PRN

## 2021-09-18 MED ORDER — SODIUM CHLORIDE 0.9 % IV SOLN: Freq: Once | INTRAVENOUS | Status: DC | PRN

## 2021-09-18 NOTE — Patient Instructions (Signed)

## 2021-09-25 ENCOUNTER — Other Ambulatory Visit: Payer: Self-pay

## 2021-09-25 ENCOUNTER — Inpatient Hospital Stay: Payer: Medicare Other | Attending: Hematology & Oncology

## 2021-09-25 VITALS — BP 188/56 | HR 70 | Temp 97.7°F | Resp 18

## 2021-09-25 DIAGNOSIS — M069 Rheumatoid arthritis, unspecified: Secondary | ICD-10-CM | POA: Insufficient documentation

## 2021-09-25 DIAGNOSIS — D510 Vitamin B12 deficiency anemia due to intrinsic factor deficiency: Secondary | ICD-10-CM | POA: Diagnosis not present

## 2021-09-25 DIAGNOSIS — L80 Vitiligo: Secondary | ICD-10-CM | POA: Diagnosis not present

## 2021-09-25 DIAGNOSIS — Z79621 Long term (current) use of calcineurin inhibitor: Secondary | ICD-10-CM | POA: Diagnosis not present

## 2021-09-25 DIAGNOSIS — D500 Iron deficiency anemia secondary to blood loss (chronic): Secondary | ICD-10-CM | POA: Diagnosis not present

## 2021-09-25 DIAGNOSIS — E039 Hypothyroidism, unspecified: Secondary | ICD-10-CM | POA: Diagnosis not present

## 2021-09-25 DIAGNOSIS — D693 Immune thrombocytopenic purpura: Secondary | ICD-10-CM | POA: Insufficient documentation

## 2021-09-25 DIAGNOSIS — Z7982 Long term (current) use of aspirin: Secondary | ICD-10-CM | POA: Insufficient documentation

## 2021-09-25 DIAGNOSIS — Z79899 Other long term (current) drug therapy: Secondary | ICD-10-CM | POA: Insufficient documentation

## 2021-09-25 MED ORDER — SODIUM CHLORIDE 0.9 % IV SOLN
125.0000 mg | Freq: Once | INTRAVENOUS | Status: AC
  Administered 2021-09-25: 125 mg via INTRAVENOUS
  Filled 2021-09-25: qty 10

## 2021-09-25 MED ORDER — METHYLPREDNISOLONE SODIUM SUCC 125 MG IJ SOLR
60.0000 mg | Freq: Once | INTRAMUSCULAR | Status: AC
  Administered 2021-09-25: 60 mg via INTRAVENOUS
  Filled 2021-09-25: qty 2

## 2021-09-25 MED ORDER — FAMOTIDINE 20 MG IN NS 100 ML IVPB
20.0000 mg | Freq: Once | INTRAVENOUS | Status: AC
  Administered 2021-09-25: 20 mg via INTRAVENOUS
  Filled 2021-09-25: qty 100

## 2021-09-25 MED ORDER — SODIUM CHLORIDE 0.9 % IV SOLN: Freq: Once | INTRAVENOUS | Status: AC

## 2021-09-25 NOTE — Patient Instructions (Signed)
Sodium Ferric Gluconate Complex Injection ?What is this medication? ?SODIUM FERRIC GLUCONATE COMPLEX (SOE dee um FER ik GLOO koe nate KOM pleks) treats low levels of iron (iron deficiency anemia) in people with kidney disease. Iron is a mineral that plays an important role in making red blood cells, which carry oxygen from your lungs to the rest of your body. ?This medicine may be used for other purposes; ask your health care provider or pharmacist if you have questions. ?COMMON BRAND NAME(S): Ferrlecit, Nulecit ?What should I tell my care team before I take this medication? ?They need to know if you have any of the following conditions: ?Anemia that is not from iron deficiency ?High levels of iron in the blood ?An unusual or allergic reaction to iron, other medications, foods, dyes, or preservatives ?Pregnant or are trying to become pregnant ?Breast-feeding ?How should I use this medication? ?This medication is injected into a vein. It is given by your care team in a hospital or clinic setting. ?Talk to your care team about the use of this medication in children. While it may be prescribed for children as young as 6 years for selected conditions, precautions do apply. ?Overdosage: If you think you have taken too much of this medicine contact a poison control center or emergency room at once. ?NOTE: This medicine is only for you. Do not share this medicine with others. ?What if I miss a dose? ?It is important not to miss your dose. Call your care team if you are unable to keep an appointment. ?What may interact with this medication? ?Do not take this medication with any of the following: ?Deferasirox ?Deferoxamine ?Dimercaprol ?This medication may also interact with the following: ?Other iron products ?This list may not describe all possible interactions. Give your health care provider a list of all the medicines, herbs, non-prescription drugs, or dietary supplements you use. Also tell them if you smoke, drink  alcohol, or use illegal drugs. Some items may interact with your medicine. ?What should I watch for while using this medication? ?Your condition will be monitored carefully while you are receiving this medication. ?Visit your care team for regular checks on your progress. You may need blood work while you are taking this medication. ?What side effects may I notice from receiving this medication? ?Side effects that you should report to your care team as soon as possible: ?Allergic reactions--skin rash, itching, hives, swelling of the face, lips, tongue, or throat ?Low blood pressure--dizziness, feeling faint or lightheaded, blurry vision ?Shortness of breath ?Side effects that usually do not require medical attention (report to your care team if they continue or are bothersome): ?Flushing ?Headache ?Joint pain ?Muscle pain ?Nausea ?Pain, redness, or irritation at injection site ?This list may not describe all possible side effects. Call your doctor for medical advice about side effects. You may report side effects to FDA at 1-800-FDA-1088. ?Where should I keep my medication? ?This medication is given in a hospital or clinic and will not be stored at home. ?NOTE: This sheet is a summary. It may not cover all possible information. If you have questions about this medicine, talk to your doctor, pharmacist, or health care provider. ?? 2022 Elsevier/Gold Standard (2021-03-01 00:00:00) ? ?

## 2021-10-02 ENCOUNTER — Other Ambulatory Visit: Payer: Self-pay

## 2021-10-02 ENCOUNTER — Inpatient Hospital Stay: Payer: Medicare Other

## 2021-10-02 VITALS — BP 170/72 | HR 75 | Temp 97.8°F | Resp 18

## 2021-10-02 DIAGNOSIS — D500 Iron deficiency anemia secondary to blood loss (chronic): Secondary | ICD-10-CM | POA: Diagnosis not present

## 2021-10-02 MED ORDER — SODIUM CHLORIDE 0.9 % IV SOLN: Freq: Once | INTRAVENOUS | Status: AC

## 2021-10-02 MED ORDER — FAMOTIDINE 20 MG IN NS 100 ML IVPB
20.0000 mg | Freq: Once | INTRAVENOUS | Status: AC
  Administered 2021-10-02: 20 mg via INTRAVENOUS
  Filled 2021-10-02: qty 20

## 2021-10-02 MED ORDER — SODIUM CHLORIDE 0.9 % IV SOLN
125.0000 mg | Freq: Once | INTRAVENOUS | Status: AC
  Administered 2021-10-02: 12:00:00 125 mg via INTRAVENOUS
  Filled 2021-10-02: qty 10

## 2021-10-02 MED ORDER — METHYLPREDNISOLONE SODIUM SUCC 125 MG IJ SOLR
60.0000 mg | Freq: Once | INTRAMUSCULAR | Status: AC
  Administered 2021-10-02: 11:00:00 60 mg via INTRAVENOUS
  Filled 2021-10-02: qty 2

## 2021-10-02 NOTE — Patient Instructions (Signed)

## 2021-10-10 ENCOUNTER — Inpatient Hospital Stay: Payer: Medicare Other

## 2021-10-10 ENCOUNTER — Inpatient Hospital Stay (HOSPITAL_BASED_OUTPATIENT_CLINIC_OR_DEPARTMENT_OTHER): Payer: Medicare Other | Admitting: Hematology & Oncology

## 2021-10-10 ENCOUNTER — Other Ambulatory Visit: Payer: Self-pay

## 2021-10-10 ENCOUNTER — Encounter: Payer: Self-pay | Admitting: Hematology & Oncology

## 2021-10-10 VITALS — BP 140/50 | HR 68 | Temp 98.0°F | Resp 20 | Wt 106.0 lb

## 2021-10-10 DIAGNOSIS — D500 Iron deficiency anemia secondary to blood loss (chronic): Secondary | ICD-10-CM | POA: Diagnosis not present

## 2021-10-10 LAB — CMP (CANCER CENTER ONLY)
ALT: 18 U/L (ref 0–44)
AST: 28 U/L (ref 15–41)
Albumin: 3.8 g/dL (ref 3.5–5.0)
Alkaline Phosphatase: 88 U/L (ref 38–126)
Anion gap: 8 (ref 5–15)
BUN: 15 mg/dL (ref 8–23)
CO2: 28 mmol/L (ref 22–32)
Calcium: 9.2 mg/dL (ref 8.9–10.3)
Chloride: 103 mmol/L (ref 98–111)
Creatinine: 0.73 mg/dL (ref 0.44–1.00)
GFR, Estimated: >60 mL/min (ref >60)
Glucose, Bld: 102 mg/dL — ABNORMAL HIGH (ref 70–99)
Potassium: 4.2 mmol/L (ref 3.5–5.1)
Sodium: 139 mmol/L (ref 135–145)
Total Bilirubin: 0.5 mg/dL (ref 0.3–1.2)
Total Protein: 6.2 g/dL — ABNORMAL LOW (ref 6.5–8.1)

## 2021-10-10 LAB — CBC WITH DIFFERENTIAL (CANCER CENTER ONLY)
Abs Immature Granulocytes: 0.02 10*3/uL (ref 0.00–0.07)
Basophils Absolute: 0.1 10*3/uL (ref 0.0–0.1)
Basophils Relative: 2 %
Eosinophils Absolute: 0 10*3/uL (ref 0.0–0.5)
Eosinophils Relative: 1 %
HCT: 32 % — ABNORMAL LOW (ref 36.0–46.0)
Hemoglobin: 9.8 g/dL — ABNORMAL LOW (ref 12.0–15.0)
Immature Granulocytes: 1 %
Lymphocytes Relative: 32 %
Lymphs Abs: 1.3 10*3/uL (ref 0.7–4.0)
MCH: 27.9 pg (ref 26.0–34.0)
MCHC: 30.6 g/dL (ref 30.0–36.0)
MCV: 91.2 fL (ref 80.0–100.0)
Monocytes Absolute: 0.8 10*3/uL (ref 0.1–1.0)
Monocytes Relative: 20 %
Neutro Abs: 1.8 10*3/uL (ref 1.7–7.7)
Neutrophils Relative %: 44 %
Platelet Count: 305 10*3/uL (ref 150–400)
RBC: 3.51 MIL/uL — ABNORMAL LOW (ref 3.87–5.11)
RDW: 25.8 % — ABNORMAL HIGH (ref 11.5–15.5)
WBC Count: 4 10*3/uL (ref 4.0–10.5)
nRBC: 0 % (ref 0.0–0.2)

## 2021-10-10 LAB — RETICULOCYTES
Immature Retic Fract: 19.4 % — ABNORMAL HIGH (ref 2.3–15.9)
RBC.: 3.49 MIL/uL — ABNORMAL LOW (ref 3.87–5.11)
Retic Count, Absolute: 71.9 10*3/uL (ref 19.0–186.0)
Retic Ct Pct: 2.1 % (ref 0.4–3.1)

## 2021-10-10 LAB — SAMPLE TO BLOOD BANK

## 2021-10-10 NOTE — Progress Notes (Signed)
Hematology and Oncology Follow Up Visit  Kathleen Pace [id number] 1941-01-24 80 y.o. 10/10/2021   Principle Diagnosis:  Chronic immune thrombocytopenia, remission. 2. Pernicious anemia. 3. Vitiligo. 4. Rheumatoid Arthritis 5. Iron Deficiency Anemia  Current Therapy:   Vitamin B12 1 mg IM Q. month  IV Iron as needed -- ferrlicit given on 17/40/8144     Interim History:  Kathleen Pace is back for follow-up.  She does feel better.  She did get iron about a week or so ago.  She got 3 doses of Ferrlecit.  This is helped her hemoglobin.  It is 9.8.  She did have an upper endoscopy and colonoscopy.  This was on November 28.  She said that they found a couple polyps.  There is no active bleeding.  She did have a nice Thanksgiving.  Her appetite has been doing okay.  She has had no nausea or vomiting.  She has had no cough or shortness of breath.  There is no leg swelling.  She has had no cardiac issues.  Overall, I would say her performance status is probably ECOG 1.    Medications:  Current Outpatient Medications:    acetaminophen (TYLENOL) 325 MG tablet, Take 650 mg by mouth every 6 (six) hours as needed., Disp: , Rfl:    albuterol (PROVENTIL HFA;VENTOLIN HFA) 108 (90 Base) MCG/ACT inhaler, Inhale 2 puffs into the lungs every 4 (four) hours as needed., Disp: , Rfl:    amLODipine (NORVASC) 10 MG tablet, Take 10 mg by mouth daily., Disp: , Rfl:    aspirin 81 MG EC tablet, Take 1 tablet by mouth daily., Disp: , Rfl:    atorvastatin (LIPITOR) 80 MG tablet, SMARTSIG:1 Tablet(s) By Mouth Every Evening, Disp: , Rfl:    carvedilol (COREG) 12.5 MG tablet, Take 12.5 mg by mouth 2 (two) times daily., Disp: , Rfl:    Cholecalciferol (VITAMIN D) 2000 UNITS tablet, Take 2,000 Units by mouth daily., Disp: , Rfl:    clopidogrel (PLAVIX) 75 MG tablet, Take 75 mg by mouth daily., Disp: , Rfl:    cyanocobalamin (,VITAMIN B-12,) 1000 MCG/ML injection, Inject 1 cc every month into your muscle, Disp:  3 mL, Rfl: 24   cycloSPORINE (RESTASIS) 0.05 % ophthalmic emulsion, Place 1 drop into both eyes 2 times daily., Disp: , Rfl:    esomeprazole (NEXIUM) 20 MG capsule, Take 20 mg by mouth daily at 12 noon., Disp: , Rfl:    hydroxychloroquine (PLAQUENIL) 200 MG tablet, 200 mg daily. , Disp: , Rfl:    leflunomide (ARAVA) 10 MG tablet, Take 10 mg by mouth daily. Take 1 and 1/2 tablet total of 15 mg daily., Disp: , Rfl:    levothyroxine (SYNTHROID, LEVOTHROID) 125 MCG tablet, 125 mcg daily. 08/31/2019 One day each week, will increase to 1.5 tabs., Disp: , Rfl:    prednisoLONE acetate (PRED FORTE) 1 % ophthalmic suspension, Place 1 drop into both eyes daily., Disp: , Rfl:    sertraline (ZOLOFT) 50 MG tablet, Take 50 mg by mouth daily., Disp: , Rfl:    umeclidinium-vilanterol (ANORO ELLIPTA) 62.5-25 MCG/INH AEPB, Inhale into the lungs daily. , Disp: , Rfl:    valsartan (DIOVAN) 320 MG tablet, Take by mouth daily. , Disp: , Rfl:    ALPRAZolam (XANAX) 0.25 MG tablet, Take 0.25 mg by mouth 2 (two) times daily as needed. (Patient not taking: Reported on 10/10/2021), Disp: , Rfl:    CALCIUM-VITAMIN D PO, Take 1 capsule by mouth every morning. Calcium 600  mg  Vitamin D 200 (Patient not taking: Reported on 06/17/2021), Disp: , Rfl:    furosemide (LASIX) 20 MG tablet, Take 20 mg by mouth daily. (Patient not taking: Reported on 09/06/2021), Disp: , Rfl:    ipratropium (ATROVENT) 0.06 % nasal spray, Place into the nose. (Patient not taking: Reported on 06/17/2021), Disp: , Rfl:    metoprolol succinate (TOPROL-XL) 25 MG 24 hr tablet, Take 25 mg by mouth daily. (Patient not taking: Reported on 10/10/2021), Disp: , Rfl:   Allergies:  Allergies  Allergen Reactions   Iodinated Diagnostic Agents Swelling   Sulfa Antibiotics Other (See Comments)    Drops Blood Pressure   Spironolactone Rash    Rash with itching unresponsive to benadryl.    Methotrexate Other (See Comments)   Doxycycline Nausea Only   Penicillins Rash     Patient tolerated cefazolin on 01/10/21   Quinine Derivatives Other (See Comments)    Patient doesn't remember reaction    Past Medical History, Surgical history, Social history, and Family History were reviewed and updated.  Review of Systems: Review of Systems  Constitutional:  Positive for malaise/fatigue.  HENT:  Positive for congestion.   Eyes: Negative.   Respiratory:  Positive for cough.   Cardiovascular:  Positive for palpitations.  Gastrointestinal:  Positive for diarrhea and nausea.  Genitourinary: Negative.   Musculoskeletal:  Positive for joint pain and myalgias.  Skin: Negative.   Neurological: Negative.   Endo/Heme/Allergies: Negative.   Psychiatric/Behavioral: Negative.      Physical Exam:  weight is 106 lb (48.1 kg). Her oral temperature is 98 F (36.7 C). Her blood pressure is 140/50 (abnormal) and her pulse is 68. Her respiration is 20 and oxygen saturation is 94%.   Physical Exam Vitals reviewed.  HENT:     Head: Normocephalic and atraumatic.  Eyes:     Pupils: Pupils are equal, round, and reactive to light.  Cardiovascular:     Rate and Rhythm: Normal rate and regular rhythm.     Heart sounds: Normal heart sounds.  Pulmonary:     Effort: Pulmonary effort is normal.     Breath sounds: Normal breath sounds.  Abdominal:     General: Bowel sounds are normal.     Palpations: Abdomen is soft.  Musculoskeletal:        General: No tenderness or deformity. Normal range of motion.     Cervical back: Normal range of motion.  Lymphadenopathy:     Cervical: No cervical adenopathy.  Skin:    General: Skin is warm and dry.     Findings: No erythema or rash.  Neurological:     Mental Status: She is alert and oriented to person, place, and time.  Psychiatric:        Behavior: Behavior normal.        Thought Content: Thought content normal.        Judgment: Judgment normal.     Lab Results  Component Value Date   WBC 4.0 10/10/2021   HGB 9.8 (L)  10/10/2021   HCT 32.0 (L) 10/10/2021   MCV 91.2 10/10/2021   PLT 305 10/10/2021     Chemistry      Component Value Date/Time   NA 139 10/10/2021 1502   NA 144 09/11/2017 1335   NA 138 07/31/2016 0947   K 4.2 10/10/2021 1502   K 4.2 09/11/2017 1335   K 4.3 07/31/2016 0947   CL 103 10/10/2021 1502   CL 104 09/11/2017 1335  CO2 28 10/10/2021 1502   CO2 28 09/11/2017 1335   CO2 27 07/31/2016 0947   BUN 15 10/10/2021 1502   BUN 17 09/11/2017 1335   BUN 14.7 07/31/2016 0947   CREATININE 0.73 10/10/2021 1502   CREATININE 0.9 09/11/2017 1335   CREATININE 0.7 07/31/2016 0947      Component Value Date/Time   CALCIUM 9.2 10/10/2021 1502   CALCIUM 9.0 09/11/2017 1335   CALCIUM 9.2 07/31/2016 0947   ALKPHOS 88 10/10/2021 1502   ALKPHOS 102 (H) 09/11/2017 1335   ALKPHOS 70 07/31/2016 0947   AST 28 10/10/2021 1502   AST 18 07/31/2016 0947   ALT 18 10/10/2021 1502   ALT 20 09/11/2017 1335   ALT 9 07/31/2016 0947   BILITOT 0.5 10/10/2021 1502   BILITOT 0.62 07/31/2016 0947      Impression and Plan: Ms. Faller is 80 year old white female. She has a clear autoimmune issue. She has pernicious anemia. She has vitiligo. She has hypo-thyroidism.  She is on Plaquenil.  Hopefully, her iron level is better.  It would not surprise me if it is still on the low side.  For right now, we will see what her iron levels are.  If we need to give her IV iron we can certainly do that.  I would like to try to get her through January if possible.  I am sure that she probably will have some GI bleeding at some point.  I wish they would have found something on her endoscopy but I certainly understand why they did not.   Volanda Napoleon, MD 12/22/20224:02 PM

## 2021-10-11 ENCOUNTER — Telehealth: Payer: Self-pay | Admitting: Hematology & Oncology

## 2021-10-11 ENCOUNTER — Telehealth: Payer: Self-pay | Admitting: *Deleted

## 2021-10-11 LAB — IRON AND IRON BINDING CAPACITY (CC-WL,HP ONLY)
Iron: 28 ug/dL (ref 28–170)
Saturation Ratios: 8 % — ABNORMAL LOW (ref 10.4–31.8)
TIBC: 353 ug/dL (ref 250–450)
UIBC: 325 ug/dL (ref 148–442)

## 2021-10-11 LAB — FERRITIN: Ferritin: 131 ng/mL (ref 11–307)

## 2021-10-11 NOTE — Telephone Encounter (Signed)
Scheduled appt per 10/10/21 los - patient is aware of appt date and time

## 2021-10-11 NOTE — Telephone Encounter (Signed)
Per secure chat Lorriane Shire - called and gave upcoming appointments - confirmed - (1) dose of IV Iron

## 2021-10-16 ENCOUNTER — Ambulatory Visit: Payer: Medicare Other

## 2021-10-23 ENCOUNTER — Encounter: Payer: Self-pay | Admitting: Family

## 2021-10-23 ENCOUNTER — Inpatient Hospital Stay: Payer: Medicare HMO | Attending: Hematology & Oncology

## 2021-10-23 ENCOUNTER — Other Ambulatory Visit: Payer: Self-pay

## 2021-10-23 VITALS — BP 122/58 | HR 60 | Temp 98.0°F | Resp 17

## 2021-10-23 DIAGNOSIS — D500 Iron deficiency anemia secondary to blood loss (chronic): Secondary | ICD-10-CM | POA: Insufficient documentation

## 2021-10-23 DIAGNOSIS — Z79899 Other long term (current) drug therapy: Secondary | ICD-10-CM | POA: Diagnosis not present

## 2021-10-23 MED ORDER — FAMOTIDINE 20 MG IN NS 100 ML IVPB
20.0000 mg | Freq: Once | INTRAVENOUS | Status: AC
  Administered 2021-10-23: 20 mg via INTRAVENOUS
  Filled 2021-10-23: qty 100

## 2021-10-23 MED ORDER — METHYLPREDNISOLONE SODIUM SUCC 125 MG IJ SOLR
60.0000 mg | Freq: Once | INTRAMUSCULAR | Status: AC
  Administered 2021-10-23: 60 mg via INTRAVENOUS
  Filled 2021-10-23: qty 2

## 2021-10-23 MED ORDER — SODIUM CHLORIDE 0.9 % IV SOLN: Freq: Once | INTRAVENOUS | Status: AC

## 2021-10-23 MED ORDER — SODIUM CHLORIDE 0.9 % IV SOLN
125.0000 mg | Freq: Once | INTRAVENOUS | Status: AC
  Administered 2021-10-23: 125 mg via INTRAVENOUS
  Filled 2021-10-23: qty 125

## 2021-10-23 NOTE — Patient Instructions (Signed)
Sodium Ferric Gluconate Complex Injection ?What is this medication? ?SODIUM FERRIC GLUCONATE COMPLEX (SOE dee um FER ik GLOO koe nate KOM pleks) treats low levels of iron (iron deficiency anemia) in people with kidney disease. Iron is a mineral that plays an important role in making red blood cells, which carry oxygen from your lungs to the rest of your body. ?This medicine may be used for other purposes; ask your health care provider or pharmacist if you have questions. ?COMMON BRAND NAME(S): Ferrlecit, Nulecit ?What should I tell my care team before I take this medication? ?They need to know if you have any of the following conditions: ?Anemia that is not from iron deficiency ?High levels of iron in the blood ?An unusual or allergic reaction to iron, other medications, foods, dyes, or preservatives ?Pregnant or are trying to become pregnant ?Breast-feeding ?How should I use this medication? ?This medication is injected into a vein. It is given by your care team in a hospital or clinic setting. ?Talk to your care team about the use of this medication in children. While it may be prescribed for children as young as 6 years for selected conditions, precautions do apply. ?Overdosage: If you think you have taken too much of this medicine contact a poison control center or emergency room at once. ?NOTE: This medicine is only for you. Do not share this medicine with others. ?What if I miss a dose? ?It is important not to miss your dose. Call your care team if you are unable to keep an appointment. ?What may interact with this medication? ?Do not take this medication with any of the following: ?Deferasirox ?Deferoxamine ?Dimercaprol ?This medication may also interact with the following: ?Other iron products ?This list may not describe all possible interactions. Give your health care provider a list of all the medicines, herbs, non-prescription drugs, or dietary supplements you use. Also tell them if you smoke, drink  alcohol, or use illegal drugs. Some items may interact with your medicine. ?What should I watch for while using this medication? ?Your condition will be monitored carefully while you are receiving this medication. ?Visit your care team for regular checks on your progress. You may need blood work while you are taking this medication. ?What side effects may I notice from receiving this medication? ?Side effects that you should report to your care team as soon as possible: ?Allergic reactions--skin rash, itching, hives, swelling of the face, lips, tongue, or throat ?Low blood pressure--dizziness, feeling faint or lightheaded, blurry vision ?Shortness of breath ?Side effects that usually do not require medical attention (report to your care team if they continue or are bothersome): ?Flushing ?Headache ?Joint pain ?Muscle pain ?Nausea ?Pain, redness, or irritation at injection site ?This list may not describe all possible side effects. Call your doctor for medical advice about side effects. You may report side effects to FDA at 1-800-FDA-1088. ?Where should I keep my medication? ?This medication is given in a hospital or clinic and will not be stored at home. ?NOTE: This sheet is a summary. It may not cover all possible information. If you have questions about this medicine, talk to your doctor, pharmacist, or health care provider. ?? 2022 Elsevier/Gold Standard (2021-03-01 00:00:00) ? ?

## 2021-11-21 ENCOUNTER — Inpatient Hospital Stay: Payer: Medicare HMO | Attending: Hematology & Oncology | Admitting: Hematology & Oncology

## 2021-11-21 ENCOUNTER — Encounter: Payer: Self-pay | Admitting: Hematology & Oncology

## 2021-11-21 ENCOUNTER — Inpatient Hospital Stay: Payer: Medicare HMO

## 2021-11-21 ENCOUNTER — Other Ambulatory Visit: Payer: Self-pay

## 2021-11-21 VITALS — BP 94/44 | HR 78 | Temp 97.6°F | Resp 22 | Ht 63.0 in | Wt 106.5 lb

## 2021-11-21 DIAGNOSIS — R059 Cough, unspecified: Secondary | ICD-10-CM | POA: Diagnosis not present

## 2021-11-21 DIAGNOSIS — R0602 Shortness of breath: Secondary | ICD-10-CM | POA: Insufficient documentation

## 2021-11-21 DIAGNOSIS — R5383 Other fatigue: Secondary | ICD-10-CM | POA: Insufficient documentation

## 2021-11-21 DIAGNOSIS — D510 Vitamin B12 deficiency anemia due to intrinsic factor deficiency: Secondary | ICD-10-CM | POA: Insufficient documentation

## 2021-11-21 DIAGNOSIS — D500 Iron deficiency anemia secondary to blood loss (chronic): Secondary | ICD-10-CM | POA: Insufficient documentation

## 2021-11-21 DIAGNOSIS — R0981 Nasal congestion: Secondary | ICD-10-CM | POA: Insufficient documentation

## 2021-11-21 DIAGNOSIS — M2550 Pain in unspecified joint: Secondary | ICD-10-CM | POA: Diagnosis not present

## 2021-11-21 DIAGNOSIS — D696 Thrombocytopenia, unspecified: Secondary | ICD-10-CM | POA: Insufficient documentation

## 2021-11-21 DIAGNOSIS — R197 Diarrhea, unspecified: Secondary | ICD-10-CM | POA: Insufficient documentation

## 2021-11-21 DIAGNOSIS — E039 Hypothyroidism, unspecified: Secondary | ICD-10-CM | POA: Diagnosis not present

## 2021-11-21 DIAGNOSIS — R531 Weakness: Secondary | ICD-10-CM | POA: Insufficient documentation

## 2021-11-21 DIAGNOSIS — M069 Rheumatoid arthritis, unspecified: Secondary | ICD-10-CM | POA: Insufficient documentation

## 2021-11-21 DIAGNOSIS — Z79899 Other long term (current) drug therapy: Secondary | ICD-10-CM | POA: Insufficient documentation

## 2021-11-21 DIAGNOSIS — L80 Vitiligo: Secondary | ICD-10-CM | POA: Insufficient documentation

## 2021-11-21 LAB — CBC WITH DIFFERENTIAL (CANCER CENTER ONLY)
Abs Immature Granulocytes: 0.04 10*3/uL (ref 0.00–0.07)
Basophils Absolute: 0.1 10*3/uL (ref 0.0–0.1)
Basophils Relative: 2 %
Eosinophils Absolute: 0.1 10*3/uL (ref 0.0–0.5)
Eosinophils Relative: 2 %
HCT: 32 % — ABNORMAL LOW (ref 36.0–46.0)
Hemoglobin: 9.6 g/dL — ABNORMAL LOW (ref 12.0–15.0)
Immature Granulocytes: 1 %
Lymphocytes Relative: 42 %
Lymphs Abs: 1.7 10*3/uL (ref 0.7–4.0)
MCH: 27.6 pg (ref 26.0–34.0)
MCHC: 30 g/dL (ref 30.0–36.0)
MCV: 92 fL (ref 80.0–100.0)
Monocytes Absolute: 1.1 10*3/uL — ABNORMAL HIGH (ref 0.1–1.0)
Monocytes Relative: 26 %
Neutro Abs: 1.1 10*3/uL — ABNORMAL LOW (ref 1.7–7.7)
Neutrophils Relative %: 27 %
Platelet Count: 368 10*3/uL (ref 150–400)
RBC: 3.48 MIL/uL — ABNORMAL LOW (ref 3.87–5.11)
RDW: 21.1 % — ABNORMAL HIGH (ref 11.5–15.5)
WBC Count: 4.1 10*3/uL (ref 4.0–10.5)
nRBC: 0.5 % — ABNORMAL HIGH (ref 0.0–0.2)

## 2021-11-21 LAB — CMP (CANCER CENTER ONLY)
ALT: 18 U/L (ref 0–44)
AST: 30 U/L (ref 15–41)
Albumin: 4.1 g/dL (ref 3.5–5.0)
Alkaline Phosphatase: 108 U/L (ref 38–126)
Anion gap: 12 (ref 5–15)
BUN: 13 mg/dL (ref 8–23)
CO2: 26 mmol/L (ref 22–32)
Calcium: 10.3 mg/dL (ref 8.9–10.3)
Chloride: 106 mmol/L (ref 98–111)
Creatinine: 0.68 mg/dL (ref 0.44–1.00)
GFR, Estimated: >60 mL/min (ref >60)
Glucose, Bld: 102 mg/dL — ABNORMAL HIGH (ref 70–99)
Potassium: 5.2 mmol/L — ABNORMAL HIGH (ref 3.5–5.1)
Sodium: 144 mmol/L (ref 135–145)
Total Bilirubin: 0.5 mg/dL (ref 0.3–1.2)
Total Protein: 6.6 g/dL (ref 6.5–8.1)

## 2021-11-21 LAB — RETICULOCYTES
Immature Retic Fract: 18.8 % — ABNORMAL HIGH (ref 2.3–15.9)
RBC.: 3.43 MIL/uL — ABNORMAL LOW (ref 3.87–5.11)
Retic Count, Absolute: 62.1 10*3/uL (ref 19.0–186.0)
Retic Ct Pct: 1.8 % (ref 0.4–3.1)

## 2021-11-21 LAB — FERRITIN: Ferritin: 28 ng/mL (ref 11–307)

## 2021-11-21 LAB — IRON AND IRON BINDING CAPACITY (CC-WL,HP ONLY)
Iron: 25 ug/dL — ABNORMAL LOW (ref 28–170)
Saturation Ratios: 6 % — ABNORMAL LOW (ref 10.4–31.8)
TIBC: 424 ug/dL (ref 250–450)
UIBC: 399 ug/dL (ref 148–442)

## 2021-11-21 NOTE — Progress Notes (Signed)
Hematology and Oncology Follow Up Visit  Kathleen Pace [id number] January 08, 1941 81 y.o. 11/21/2021   Principle Diagnosis:  Chronic immune thrombocytopenia, remission. 2. Pernicious anemia. 3. Vitiligo. 4. Rheumatoid Arthritis 5. Iron Deficiency Anemia  Current Therapy:   Vitamin B12 1 mg IM Q. month  IV Iron as needed -- ferrlicit given on 73/41/9379     Interim History:  Ms.  Pace is back for follow-up.  She actually looks pretty good.  She feels okay.  She does have some shortness of breath with exertion.  She said that she recently had a chest CT scan.  From what she says, everything looked okay.  She has had no problems with obvious bleeding.  When we last saw her, her iron saturation was only 8%.  We did give her some Ferrlecit.  Has been no problems with bowels or bladder.  There is been no problems with rashes.  She does do her vitamin B12 at home.  She has had no issues with her heart.  She has had some cardiac procedures done.  Overall, I would say her performance status is ECOG 1.    Medications:  Current Outpatient Medications:    acetaminophen (TYLENOL) 325 MG tablet, Take 650 mg by mouth every 6 (six) hours as needed., Disp: , Rfl:    albuterol (PROVENTIL HFA;VENTOLIN HFA) 108 (90 Base) MCG/ACT inhaler, Inhale 2 puffs into the lungs every 4 (four) hours as needed., Disp: , Rfl:    ALPRAZolam (XANAX) 0.25 MG tablet, Take 0.25 mg by mouth 2 (two) times daily as needed. (Patient not taking: Reported on 10/10/2021), Disp: , Rfl:    amLODipine (NORVASC) 10 MG tablet, Take 10 mg by mouth daily., Disp: , Rfl:    aspirin 81 MG EC tablet, Take 1 tablet by mouth daily., Disp: , Rfl:    atorvastatin (LIPITOR) 80 MG tablet, SMARTSIG:1 Tablet(s) By Mouth Every Evening, Disp: , Rfl:    CALCIUM-VITAMIN D PO, Take 1 capsule by mouth every morning. Calcium 600 mg  Vitamin D 200 (Patient not taking: Reported on 06/17/2021), Disp: , Rfl:    carvedilol (COREG) 12.5 MG tablet, Take  12.5 mg by mouth 2 (two) times daily., Disp: , Rfl:    Cholecalciferol (VITAMIN D) 2000 UNITS tablet, Take 2,000 Units by mouth daily., Disp: , Rfl:    clopidogrel (PLAVIX) 75 MG tablet, Take 75 mg by mouth daily., Disp: , Rfl:    cyanocobalamin (,VITAMIN B-12,) 1000 MCG/ML injection, Inject 1 cc every month into your muscle, Disp: 3 mL, Rfl: 24   cycloSPORINE (RESTASIS) 0.05 % ophthalmic emulsion, Place 1 drop into both eyes 2 times daily., Disp: , Rfl:    esomeprazole (NEXIUM) 20 MG capsule, Take 20 mg by mouth daily at 12 noon., Disp: , Rfl:    hydroxychloroquine (PLAQUENIL) 200 MG tablet, 200 mg daily. , Disp: , Rfl:    leflunomide (ARAVA) 10 MG tablet, Take 10 mg by mouth daily. Take 1 and 1/2 tablet total of 15 mg daily., Disp: , Rfl:    levothyroxine (SYNTHROID, LEVOTHROID) 125 MCG tablet, 125 mcg daily. 08/31/2019 One day each week, will increase to 1.5 tabs., Disp: , Rfl:    metoprolol succinate (TOPROL-XL) 25 MG 24 hr tablet, Take 25 mg by mouth daily. (Patient not taking: Reported on 10/10/2021), Disp: , Rfl:    prednisoLONE acetate (PRED FORTE) 1 % ophthalmic suspension, Place 1 drop into both eyes daily., Disp: , Rfl:    sertraline (ZOLOFT) 50 MG tablet, Take 50  mg by mouth daily., Disp: , Rfl:    umeclidinium-vilanterol (ANORO ELLIPTA) 62.5-25 MCG/INH AEPB, Inhale into the lungs daily. , Disp: , Rfl:    valsartan (DIOVAN) 320 MG tablet, Take by mouth daily. , Disp: , Rfl:   Allergies:  Allergies  Allergen Reactions   Iodinated Contrast Media Swelling   Sulfa Antibiotics Other (See Comments)    Drops Blood Pressure   Spironolactone Rash    Rash with itching unresponsive to benadryl.    Methotrexate Other (See Comments)   Doxycycline Nausea Only   Penicillins Rash    Patient tolerated cefazolin on 01/10/21   Quinine Derivatives Other (See Comments)    Patient doesn't remember reaction    Past Medical History, Surgical history, Social history, and Family History were reviewed  and updated.  Review of Systems: Review of Systems  Constitutional:  Positive for malaise/fatigue.  HENT:  Positive for congestion.   Eyes: Negative.   Respiratory:  Positive for cough.   Cardiovascular:  Positive for palpitations.  Gastrointestinal:  Positive for diarrhea and nausea.  Genitourinary: Negative.   Musculoskeletal:  Positive for joint pain and myalgias.  Skin: Negative.   Neurological: Negative.   Endo/Heme/Allergies: Negative.   Psychiatric/Behavioral: Negative.      Physical Exam:  height is 5\' 3"  (1.6 m) and weight is 106 lb 8 oz (48.3 kg). Her oral temperature is 97.6 F (36.4 C). Her blood pressure is 94/44 (abnormal) and her pulse is 78. Her respiration is 22 (abnormal).   Physical Exam Vitals reviewed.  HENT:     Head: Normocephalic and atraumatic.  Eyes:     Pupils: Pupils are equal, round, and reactive to light.  Cardiovascular:     Rate and Rhythm: Normal rate and regular rhythm.     Heart sounds: Normal heart sounds.  Pulmonary:     Effort: Pulmonary effort is normal.     Breath sounds: Normal breath sounds.  Abdominal:     General: Bowel sounds are normal.     Palpations: Abdomen is soft.  Musculoskeletal:        General: No tenderness or deformity. Normal range of motion.     Cervical back: Normal range of motion.  Lymphadenopathy:     Cervical: No cervical adenopathy.  Skin:    General: Skin is warm and dry.     Findings: No erythema or rash.  Neurological:     Mental Status: She is alert and oriented to person, place, and time.  Psychiatric:        Behavior: Behavior normal.        Thought Content: Thought content normal.        Judgment: Judgment normal.     Lab Results  Component Value Date   WBC 4.1 11/21/2021   HGB 9.6 (L) 11/21/2021   HCT 32.0 (L) 11/21/2021   MCV 92.0 11/21/2021   PLT 368 11/21/2021     Chemistry      Component Value Date/Time   NA 144 11/21/2021 1007   NA 144 09/11/2017 1335   NA 138 07/31/2016  0947   K 5.2 (H) 11/21/2021 1007   K 4.2 09/11/2017 1335   K 4.3 07/31/2016 0947   CL 106 11/21/2021 1007   CL 104 09/11/2017 1335   CO2 26 11/21/2021 1007   CO2 28 09/11/2017 1335   CO2 27 07/31/2016 0947   BUN 13 11/21/2021 1007   BUN 17 09/11/2017 1335   BUN 14.7 07/31/2016 0947   CREATININE 0.68  11/21/2021 1007   CREATININE 0.9 09/11/2017 1335   CREATININE 0.7 07/31/2016 0947      Component Value Date/Time   CALCIUM 10.3 11/21/2021 1007   CALCIUM 9.0 09/11/2017 1335   CALCIUM 9.2 07/31/2016 0947   ALKPHOS 108 11/21/2021 1007   ALKPHOS 102 (H) 09/11/2017 1335   ALKPHOS 70 07/31/2016 0947   AST 30 11/21/2021 1007   AST 18 07/31/2016 0947   ALT 18 11/21/2021 1007   ALT 20 09/11/2017 1335   ALT 9 07/31/2016 0947   BILITOT 0.5 11/21/2021 1007   BILITOT 0.62 07/31/2016 0947      Impression and Plan: Ms. Talford is 81 year old white female. She has a clear autoimmune issue. She has pernicious anemia. She has vitiligo. She has hypo-thyroidism.  She is on Plaquenil.  Hopefully, her iron level is better.  It would not surprise me if it is still on the low side.  For right now, we will see what her iron levels are.  If we need to give her IV iron we can certainly do that.  I would like to get her back in 6 weeks.  I think this would be a reasonable.  Hopefully we can move our appointments further and further out as her iron levels improve.   Volanda Napoleon, MD 2/2/202310:56 AM

## 2021-11-21 NOTE — Addendum Note (Signed)
Addended by: Volanda Napoleon on: 11/21/2021 01:33 PM   Modules accepted: Orders

## 2021-11-22 ENCOUNTER — Telehealth: Payer: Self-pay | Admitting: *Deleted

## 2021-11-22 ENCOUNTER — Ambulatory Visit: Payer: Medicare HMO

## 2021-11-22 NOTE — Telephone Encounter (Signed)
Per result message Kathleen Pace per dr. Marin Olp (1) dose of IV Iron - confirmed

## 2021-11-26 ENCOUNTER — Inpatient Hospital Stay: Payer: Medicare HMO

## 2021-11-26 ENCOUNTER — Other Ambulatory Visit: Payer: Self-pay

## 2021-11-26 VITALS — BP 116/41 | HR 58 | Temp 97.9°F | Resp 18

## 2021-11-26 DIAGNOSIS — D500 Iron deficiency anemia secondary to blood loss (chronic): Secondary | ICD-10-CM

## 2021-11-26 DIAGNOSIS — K909 Intestinal malabsorption, unspecified: Secondary | ICD-10-CM

## 2021-11-26 MED ORDER — SODIUM CHLORIDE 0.9 % IV SOLN: Freq: Once | INTRAVENOUS | Status: AC

## 2021-11-26 MED ORDER — SODIUM CHLORIDE 0.9 % IV SOLN
1000.0000 mg | Freq: Once | INTRAVENOUS | Status: AC
  Administered 2021-11-26: 1000 mg via INTRAVENOUS
  Filled 2021-11-26: qty 10

## 2021-11-26 NOTE — Patient Instructions (Addendum)
Ferric Derisomaltose Injection What is this medication? FERRIC DERISOMALTOSE (FER ik der EYE soe MAWL tose) treats low levels of iron in your body (iron deficiency anemia). Iron is a mineral that plays an important role in making red blood cells, which carry oxygen from your lungs to the rest of your body. This medicine may be used for other purposes; ask your health care provider or pharmacist if you have questions. COMMON BRAND NAME(S): MONOFERRIC What should I tell my care team before I take this medication? They need to know if you have any of these conditions: High levels of iron in the blood An unusual or allergic reaction to iron, other medications, foods, dyes, or preservatives Pregnant or trying to get pregnant Breast-feeding How should I use this medication? This medication is for injection into a vein. It is given in a hospital or clinic setting. Talk to your care team about the use of this medication in children. Special care may be needed. Overdosage: If you think you have taken too much of this medicine contact a poison control center or emergency room at once. NOTE: This medicine is only for you. Do not share this medicine with others. What if I miss a dose? It is important not to miss your dose. Call your care team if you are unable to keep an appointment. What may interact with this medication? Do not take this medication with any of the following: Deferoxamine Dimercaprol Other iron products This list may not describe all possible interactions. Give your health care provider a list of all the medicines, herbs, non-prescription drugs, or dietary supplements you use. Also tell them if you smoke, drink alcohol, or use illegal drugs. Some items may interact with your medicine. What should I watch for while using this medication? Visit your care team regularly. Tell your care team if your symptoms do not start to get better or if they get worse. You may need blood work done  while you are taking this medication. You may need to follow a special diet. Talk to your care team. Foods that contain iron include whole grains/cereals, dried fruits, beans, or peas, leafy green vegetables, and organ meats (liver, kidney). What side effects may I notice from receiving this medication? Side effects that you should report to your care team as soon as possible: Allergic reactions--skin rash, itching, hives, swelling of the face, lips, tongue, or throat Low blood pressure--dizziness, feeling faint or lightheaded, blurry vision Shortness of breath Side effects that usually do not require medical attention (report to your care team if they continue or are bothersome): Flushing Headache Joint pain Muscle pain Nausea Pain, redness, or irritation at injection site This list may not describe all possible side effects. Call your doctor for medical advice about side effects. You may report side effects to FDA at 1-800-FDA-1088. Where should I keep my medication? This medication is given in a hospital or clinic and will not be stored at home. NOTE: This sheet is a summary. It may not cover all possible information. If you have questions about this medicine, talk to your doctor, pharmacist, or health care provider.  2022 Elsevier/Gold Standard (2021-03-01 00:00:0\[id number]\

## 2022-01-02 ENCOUNTER — Inpatient Hospital Stay: Payer: Medicare HMO | Admitting: Hematology & Oncology

## 2022-01-02 ENCOUNTER — Other Ambulatory Visit: Payer: Self-pay

## 2022-01-02 ENCOUNTER — Inpatient Hospital Stay: Payer: Medicare HMO | Attending: Hematology & Oncology

## 2022-01-02 ENCOUNTER — Encounter: Payer: Self-pay | Admitting: Hematology & Oncology

## 2022-01-02 VITALS — BP 121/62 | HR 75 | Temp 97.8°F | Resp 19 | Wt 104.0 lb

## 2022-01-02 DIAGNOSIS — D500 Iron deficiency anemia secondary to blood loss (chronic): Secondary | ICD-10-CM | POA: Insufficient documentation

## 2022-01-02 DIAGNOSIS — L80 Vitiligo: Secondary | ICD-10-CM | POA: Diagnosis not present

## 2022-01-02 DIAGNOSIS — Z79899 Other long term (current) drug therapy: Secondary | ICD-10-CM | POA: Diagnosis not present

## 2022-01-02 DIAGNOSIS — D693 Immune thrombocytopenic purpura: Secondary | ICD-10-CM | POA: Diagnosis not present

## 2022-01-02 DIAGNOSIS — K5900 Constipation, unspecified: Secondary | ICD-10-CM | POA: Diagnosis not present

## 2022-01-02 DIAGNOSIS — D510 Vitamin B12 deficiency anemia due to intrinsic factor deficiency: Secondary | ICD-10-CM | POA: Diagnosis not present

## 2022-01-02 DIAGNOSIS — M069 Rheumatoid arthritis, unspecified: Secondary | ICD-10-CM | POA: Diagnosis not present

## 2022-01-02 DIAGNOSIS — R531 Weakness: Secondary | ICD-10-CM | POA: Insufficient documentation

## 2022-01-02 DIAGNOSIS — E039 Hypothyroidism, unspecified: Secondary | ICD-10-CM | POA: Insufficient documentation

## 2022-01-02 DIAGNOSIS — R5383 Other fatigue: Secondary | ICD-10-CM | POA: Insufficient documentation

## 2022-01-02 LAB — CMP (CANCER CENTER ONLY)
ALT: 19 U/L (ref 0–44)
AST: 33 U/L (ref 15–41)
Albumin: 4.1 g/dL (ref 3.5–5.0)
Alkaline Phosphatase: 123 U/L (ref 38–126)
Anion gap: 9 (ref 5–15)
BUN: 14 mg/dL (ref 8–23)
CO2: 28 mmol/L (ref 22–32)
Calcium: 10 mg/dL (ref 8.9–10.3)
Chloride: 102 mmol/L (ref 98–111)
Creatinine: 0.67 mg/dL (ref 0.44–1.00)
GFR, Estimated: >60 mL/min (ref >60)
Glucose, Bld: 95 mg/dL (ref 70–99)
Potassium: 4.4 mmol/L (ref 3.5–5.1)
Sodium: 139 mmol/L (ref 135–145)
Total Bilirubin: 0.4 mg/dL (ref 0.3–1.2)
Total Protein: 6.8 g/dL (ref 6.5–8.1)

## 2022-01-02 LAB — CBC WITH DIFFERENTIAL (CANCER CENTER ONLY)
Abs Immature Granulocytes: 0.02 10*3/uL (ref 0.00–0.07)
Basophils Absolute: 0.1 10*3/uL (ref 0.0–0.1)
Basophils Relative: 2 %
Eosinophils Absolute: 0.1 10*3/uL (ref 0.0–0.5)
Eosinophils Relative: 1 %
HCT: 36 % (ref 36.0–46.0)
Hemoglobin: 11.3 g/dL — ABNORMAL LOW (ref 12.0–15.0)
Immature Granulocytes: 0 %
Lymphocytes Relative: 43 %
Lymphs Abs: 2.2 10*3/uL (ref 0.7–4.0)
MCH: 31.5 pg (ref 26.0–34.0)
MCHC: 31.4 g/dL (ref 30.0–36.0)
MCV: 100.3 fL — ABNORMAL HIGH (ref 80.0–100.0)
Monocytes Absolute: 0.9 10*3/uL (ref 0.1–1.0)
Monocytes Relative: 17 %
Neutro Abs: 1.9 10*3/uL (ref 1.7–7.7)
Neutrophils Relative %: 37 %
Platelet Count: 348 10*3/uL (ref 150–400)
RBC: 3.59 MIL/uL — ABNORMAL LOW (ref 3.87–5.11)
RDW: 22.9 % — ABNORMAL HIGH (ref 11.5–15.5)
WBC Count: 5.1 10*3/uL (ref 4.0–10.5)
nRBC: 0 % (ref 0.0–0.2)

## 2022-01-02 LAB — RETICULOCYTES
Immature Retic Fract: 11.8 % (ref 2.3–15.9)
RBC.: 3.63 MIL/uL — ABNORMAL LOW (ref 3.87–5.11)
Retic Count, Absolute: 67.5 10*3/uL (ref 19.0–186.0)
Retic Ct Pct: 1.9 % (ref 0.4–3.1)

## 2022-01-02 LAB — IRON AND IRON BINDING CAPACITY (CC-WL,HP ONLY)
Iron: 37 ug/dL (ref 28–170)
Saturation Ratios: 11 % (ref 10.4–31.8)
TIBC: 328 ug/dL (ref 250–450)
UIBC: 291 ug/dL (ref 148–442)

## 2022-01-02 LAB — VITAMIN B12: Vitamin B-12: 353 pg/mL (ref 180–914)

## 2022-01-02 LAB — FERRITIN: Ferritin: 83 ng/mL (ref 11–307)

## 2022-01-02 NOTE — Progress Notes (Signed)
Hematology and Oncology Follow Up Visit ? ?Kathleen Pace ?[id number] ?Sep 01, 1941 81 y.o. ?01/02/2022 ? ? ?Principle Diagnosis:  ?Chronic immune thrombocytopenia, remission. ?2. Pernicious anemia. ?3. Vitiligo. ?4. Rheumatoid Arthritis ?5. Iron Deficiency Anemia ? ?Current Therapy:   ?Vitamin B12 1 mg IM Q. month  ?IV Iron as needed --Monoferric given on 11/26/2021  ?    ?Interim History:  Ms.  Pace is back for follow-up.  She is having some nausea.  She has had diarrhea over the past weekend.  I am unsure if she has any kind of gastroenteritis. ? ?Her iron was given back in early February.  She got Monoferric.  At that time, her iron saturation was only 6%. ? ?She has not noted any obvious bleeding.  Has been no cough or shortness of breath. ? ?She has lost a couple pounds.  Again this may be from the diarrhea that she had. ? ?She has had no problems with pain.  She has had no rashes.  There is been no leg swelling. ? ?Overall, I would hesitate her performance status is ECOG 2.    ? ?Medications:  ?Current Outpatient Medications:  ?  acetaminophen (TYLENOL) 325 MG tablet, Take 650 mg by mouth every 6 (six) hours as needed., Disp: , Rfl:  ?  albuterol (PROVENTIL HFA;VENTOLIN HFA) 108 (90 Base) MCG/ACT inhaler, Inhale 2 puffs into the lungs every 4 (four) hours as needed., Disp: , Rfl:  ?  ALPRAZolam (XANAX) 0.25 MG tablet, Take 0.25 mg by mouth 2 (two) times daily as needed. (Patient not taking: Reported on 10/10/2021), Disp: , Rfl:  ?  amLODipine (NORVASC) 10 MG tablet, Take 10 mg by mouth daily., Disp: , Rfl:  ?  aspirin 81 MG EC tablet, Take 1 tablet by mouth daily., Disp: , Rfl:  ?  aspirin 81 MG EC tablet, Take 81 mg by mouth daily., Disp: , Rfl:  ?  atorvastatin (LIPITOR) 80 MG tablet, SMARTSIG:1 Tablet(s) By Mouth Every Evening, Disp: , Rfl:  ?  CALCIUM-VITAMIN D PO, Take 1 capsule by mouth every morning. Calcium 600 mg  Vitamin D 200 (Patient not taking: Reported on 06/17/2021), Disp: , Rfl:  ?   Cholecalciferol (VITAMIN D) 2000 UNITS tablet, Take 2,000 Units by mouth daily., Disp: , Rfl:  ?  clopidogrel (PLAVIX) 75 MG tablet, Take 75 mg by mouth daily., Disp: , Rfl:  ?  cyanocobalamin (,VITAMIN B-12,) 1000 MCG/ML injection, Inject 1 cc every month into your muscle, Disp: 3 mL, Rfl: 24 ?  cycloSPORINE (RESTASIS) 0.05 % ophthalmic emulsion, Place 1 drop into both eyes 2 times daily., Disp: , Rfl:  ?  esomeprazole (NEXIUM) 20 MG capsule, Take 20 mg by mouth daily at 12 noon., Disp: , Rfl:  ?  hydroxychloroquine (PLAQUENIL) 200 MG tablet, 200 mg daily. , Disp: , Rfl:  ?  leflunomide (ARAVA) 10 MG tablet, Take 10 mg by mouth daily. Take 1 and 1/2 tablet total of 15 mg daily., Disp: , Rfl:  ?  levothyroxine (SYNTHROID, LEVOTHROID) 125 MCG tablet, 125 mcg daily. 08/31/2019 One day each week, will increase to 1.5 tabs., Disp: , Rfl:  ?  metoprolol succinate (TOPROL-XL) 25 MG 24 hr tablet, Take 25 mg by mouth daily. (Patient not taking: Reported on 10/10/2021), Disp: , Rfl:  ?  prednisoLONE acetate (PRED FORTE) 1 % ophthalmic suspension, Place 1 drop into both eyes daily., Disp: , Rfl:  ?  sertraline (ZOLOFT) 50 MG tablet, Take 50 mg by mouth daily., Disp: , Rfl:  ?  umeclidinium-vilanterol (ANORO ELLIPTA) 62.5-25 MCG/INH AEPB, Inhale into the lungs daily. , Disp: , Rfl:  ?  valsartan (DIOVAN) 320 MG tablet, Take by mouth daily. , Disp: , Rfl:  ? ?Allergies:  ?Allergies  ?Allergen Reactions  ? Iodinated Contrast Media Swelling  ? Sulfa Antibiotics Other (See Comments)  ?  Drops Blood Pressure  ? Spironolactone Rash  ?  Rash with itching unresponsive to benadryl.   ? Methotrexate Other (See Comments)  ? Doxycycline Nausea Only  ? Penicillins Rash  ?  Patient tolerated cefazolin on 01/10/21  ? Quinine Derivatives Other (See Comments)  ?  Patient doesn't remember reaction  ? ? ?Past Medical History, Surgical history, Social history, and Family History were reviewed and updated. ? ?Review of Systems: ?Review of Systems   ?Constitutional:  Positive for malaise/fatigue.  ?HENT:  Positive for congestion.   ?Eyes: Negative.   ?Respiratory:  Positive for cough.   ?Cardiovascular:  Positive for palpitations.  ?Gastrointestinal:  Positive for diarrhea and nausea.  ?Genitourinary: Negative.   ?Musculoskeletal:  Positive for joint pain and myalgias.  ?Skin: Negative.   ?Neurological: Negative.   ?Endo/Heme/Allergies: Negative.   ?Psychiatric/Behavioral: Negative.    ? ? ?Physical Exam: ? weight is 47.2 kg. Her oral temperature is 97.8 ?F (36.6 ?C). Her blood pressure is 121/62 and her pulse is 75. Her respiration is 19 and oxygen saturation is 100%.  ? ?Physical Exam ?Vitals reviewed.  ?HENT:  ?   Head: Normocephalic and atraumatic.  ?Eyes:  ?   Pupils: Pupils are equal, round, and reactive to light.  ?Cardiovascular:  ?   Rate and Rhythm: Normal rate and regular rhythm.  ?   Heart sounds: Normal heart sounds.  ?Pulmonary:  ?   Effort: Pulmonary effort is normal.  ?   Breath sounds: Normal breath sounds.  ?Abdominal:  ?   General: Bowel sounds are normal.  ?   Palpations: Abdomen is soft.  ?Musculoskeletal:     ?   General: No tenderness or deformity. Normal range of motion.  ?   Cervical back: Normal range of motion.  ?Lymphadenopathy:  ?   Cervical: No cervical adenopathy.  ?Skin: ?   General: Skin is warm and dry.  ?   Findings: No erythema or rash.  ?Neurological:  ?   Mental Status: She is alert and oriented to person, place, and time.  ?Psychiatric:     ?   Behavior: Behavior normal.     ?   Thought Content: Thought content normal.     ?   Judgment: Judgment normal.  ? ? ? ?Lab Results  ?Component Value Date  ? WBC 5.1 01/02/2022  ? HGB 11.3 (L) 01/02/2022  ? HCT 36.0 01/02/2022  ? MCV 100.3 (H) 01/02/2022  ? PLT 348 01/02/2022  ? ?  Chemistry   ?   ?Component Value Date/Time  ? NA 139 01/02/2022 1148  ? NA 144 09/11/2017 1335  ? NA 138 07/31/2016 0947  ? K 4.4 01/02/2022 1148  ? K 4.2 09/11/2017 1335  ? K 4.3 07/31/2016 0947  ? CL  102 01/02/2022 1148  ? CL 104 09/11/2017 1335  ? CO2 28 01/02/2022 1148  ? CO2 28 09/11/2017 1335  ? CO2 27 07/31/2016 0947  ? BUN 14 01/02/2022 1148  ? BUN 17 09/11/2017 1335  ? BUN 14.7 07/31/2016 0947  ? CREATININE 0.67 01/02/2022 1148  ? CREATININE 0.9 09/11/2017 1335  ? CREATININE 0.7 07/31/2016 0947  ?    ?  Component Value Date/Time  ? CALCIUM 10.0 01/02/2022 1148  ? CALCIUM 9.0 09/11/2017 1335  ? CALCIUM 9.2 07/31/2016 0947  ? ALKPHOS 123 01/02/2022 1148  ? ALKPHOS 102 (H) 09/11/2017 1335  ? ALKPHOS 70 07/31/2016 0947  ? AST 33 01/02/2022 1148  ? AST 18 07/31/2016 0947  ? ALT 19 01/02/2022 1148  ? ALT 20 09/11/2017 1335  ? ALT 9 07/31/2016 0947  ? BILITOT 0.4 01/02/2022 1148  ? BILITOT 0.62 07/31/2016 0947  ?  ? ? ?Impression and Plan: ?Kathleen Pace is 81 year old white female. She has a clear autoimmune issue. She has pernicious anemia. She has vitiligo. She has hypo-thyroidism.  She is on Plaquenil. ? ?We will have to see what her iron level is.  Hopefully, this will be little bit better.  Her hemoglobin certainly is much better which is nice to see. ? ?I think we can probably get her back in 6 weeks again.  If she has any problems before then, we will be more than happy to see her back sooner.   ? ? ?Volanda Napoleon, MD ?3/16/202312:57 PM ?

## 2022-01-08 ENCOUNTER — Other Ambulatory Visit: Payer: Self-pay

## 2022-01-08 ENCOUNTER — Inpatient Hospital Stay: Payer: Medicare HMO

## 2022-01-08 VITALS — BP 135/60 | HR 71 | Temp 97.9°F | Resp 16

## 2022-01-08 DIAGNOSIS — D500 Iron deficiency anemia secondary to blood loss (chronic): Secondary | ICD-10-CM | POA: Diagnosis not present

## 2022-01-08 DIAGNOSIS — K909 Intestinal malabsorption, unspecified: Secondary | ICD-10-CM

## 2022-01-08 MED ORDER — SODIUM CHLORIDE 0.9 % IV SOLN
1000.0000 mg | Freq: Once | INTRAVENOUS | Status: AC
  Administered 2022-01-08: 1000 mg via INTRAVENOUS
  Filled 2022-01-08: qty 10

## 2022-01-08 MED ORDER — SODIUM CHLORIDE 0.9 % IV SOLN: Freq: Once | INTRAVENOUS | Status: AC

## 2022-01-08 NOTE — Patient Instructions (Signed)

## 2022-02-04 ENCOUNTER — Other Ambulatory Visit: Payer: Self-pay | Admitting: Pharmacist

## 2022-02-13 ENCOUNTER — Encounter: Payer: Self-pay | Admitting: Hematology & Oncology

## 2022-02-13 ENCOUNTER — Inpatient Hospital Stay: Payer: Medicare HMO | Admitting: Hematology & Oncology

## 2022-02-13 ENCOUNTER — Other Ambulatory Visit: Payer: Self-pay

## 2022-02-13 ENCOUNTER — Inpatient Hospital Stay: Payer: Medicare HMO | Attending: Hematology & Oncology

## 2022-02-13 VITALS — BP 150/61 | HR 72 | Temp 97.9°F | Resp 19 | Ht 63.0 in | Wt 104.0 lb

## 2022-02-13 DIAGNOSIS — E039 Hypothyroidism, unspecified: Secondary | ICD-10-CM | POA: Insufficient documentation

## 2022-02-13 DIAGNOSIS — D510 Vitamin B12 deficiency anemia due to intrinsic factor deficiency: Secondary | ICD-10-CM

## 2022-02-13 DIAGNOSIS — D509 Iron deficiency anemia, unspecified: Secondary | ICD-10-CM | POA: Diagnosis not present

## 2022-02-13 DIAGNOSIS — L80 Vitiligo: Secondary | ICD-10-CM | POA: Insufficient documentation

## 2022-02-13 DIAGNOSIS — D693 Immune thrombocytopenic purpura: Secondary | ICD-10-CM | POA: Insufficient documentation

## 2022-02-13 DIAGNOSIS — M069 Rheumatoid arthritis, unspecified: Secondary | ICD-10-CM | POA: Insufficient documentation

## 2022-02-13 DIAGNOSIS — D500 Iron deficiency anemia secondary to blood loss (chronic): Secondary | ICD-10-CM | POA: Diagnosis not present

## 2022-02-13 LAB — CBC WITH DIFFERENTIAL (CANCER CENTER ONLY)
Abs Immature Granulocytes: 0.04 10*3/uL (ref 0.00–0.07)
Basophils Absolute: 0.1 10*3/uL (ref 0.0–0.1)
Basophils Relative: 2 %
Eosinophils Absolute: 0.1 10*3/uL (ref 0.0–0.5)
Eosinophils Relative: 2 %
HCT: 37.8 % (ref 36.0–46.0)
Hemoglobin: 12.2 g/dL (ref 12.0–15.0)
Immature Granulocytes: 1 %
Lymphocytes Relative: 38 %
Lymphs Abs: 1.6 10*3/uL (ref 0.7–4.0)
MCH: 33.1 pg (ref 26.0–34.0)
MCHC: 32.3 g/dL (ref 30.0–36.0)
MCV: 102.4 fL — ABNORMAL HIGH (ref 80.0–100.0)
Monocytes Absolute: 0.7 10*3/uL (ref 0.1–1.0)
Monocytes Relative: 17 %
Neutro Abs: 1.8 10*3/uL (ref 1.7–7.7)
Neutrophils Relative %: 40 %
Platelet Count: 295 10*3/uL (ref 150–400)
RBC: 3.69 MIL/uL — ABNORMAL LOW (ref 3.87–5.11)
RDW: 18.7 % — ABNORMAL HIGH (ref 11.5–15.5)
WBC Count: 4.3 10*3/uL (ref 4.0–10.5)
nRBC: 0 % (ref 0.0–0.2)

## 2022-02-13 LAB — RETICULOCYTES
Immature Retic Fract: 8.1 % (ref 2.3–15.9)
RBC.: 3.68 MIL/uL — ABNORMAL LOW (ref 3.87–5.11)
Retic Count, Absolute: 69.6 10*3/uL (ref 19.0–186.0)
Retic Ct Pct: 1.9 % (ref 0.4–3.1)

## 2022-02-13 LAB — CMP (CANCER CENTER ONLY)
ALT: 20 U/L (ref 0–44)
AST: 32 U/L (ref 15–41)
Albumin: 3.9 g/dL (ref 3.5–5.0)
Alkaline Phosphatase: 134 U/L — ABNORMAL HIGH (ref 38–126)
Anion gap: 7 (ref 5–15)
BUN: 10 mg/dL (ref 8–23)
CO2: 30 mmol/L (ref 22–32)
Calcium: 9.2 mg/dL (ref 8.9–10.3)
Chloride: 102 mmol/L (ref 98–111)
Creatinine: 0.63 mg/dL (ref 0.44–1.00)
GFR, Estimated: >60 mL/min (ref >60)
Glucose, Bld: 85 mg/dL (ref 70–99)
Potassium: 4.6 mmol/L (ref 3.5–5.1)
Sodium: 139 mmol/L (ref 135–145)
Total Bilirubin: 0.4 mg/dL (ref 0.3–1.2)
Total Protein: 6.3 g/dL — ABNORMAL LOW (ref 6.5–8.1)

## 2022-02-13 LAB — FERRITIN: Ferritin: 132 ng/mL (ref 11–307)

## 2022-02-13 NOTE — Progress Notes (Signed)
Hematology and Oncology Follow Up Visit ? ?TYAN LASURE ?[id number] ?04-Aug-1941 81 y.o. ?02/13/2022 ? ? ?Principle Diagnosis:  ?Chronic immune thrombocytopenia, remission. ?2. Pernicious anemia. ?3. Vitiligo. ?4. Rheumatoid Arthritis ?5. Iron Deficiency Anemia ? ?Current Therapy:   ?Vitamin B12 1 mg IM Q. month  ?IV Iron as needed --Monoferric given on 01/08/2022  ?    ?Interim History:  Ms.  Kathleen Pace is back for follow-up.  She is going to have a CT of the abdomen in a week or so.  She appears been having some abdominal issues.  She has been having some abdominal pain.  She did have an diarrhea.  This seems to happen after she eats. ? ?I think she recently has had upper and lower endoscopies. ? ?As far she can tell, there is no obvious bleeding. ? ?We did go ahead and give her some iron about a month ago.  I think this is worked quite well. ? ?She has had no issues with fever.  There is been no cough or shortness of breath. ? ?She recently saw her cardiologist.  She got a clean diagnosis with respect to her cardiac issues. ? ?She has had no leg swelling.  She has had no urinary problems. ? ?Overall, I would say her performance status is probably ECOG 1..    ? ?Medications:  ?Current Outpatient Medications:  ?  acetaminophen (TYLENOL) 325 MG tablet, Take 650 mg by mouth every 6 (six) hours as needed., Disp: , Rfl:  ?  albuterol (PROVENTIL HFA;VENTOLIN HFA) 108 (90 Base) MCG/ACT inhaler, Inhale 2 puffs into the lungs every 4 (four) hours as needed., Disp: , Rfl:  ?  ALPRAZolam (XANAX) 0.25 MG tablet, Take 0.25 mg by mouth 2 (two) times daily as needed. (Patient not taking: Reported on 10/10/2021), Disp: , Rfl:  ?  amLODipine (NORVASC) 10 MG tablet, Take 10 mg by mouth daily., Disp: , Rfl:  ?  aspirin 81 MG EC tablet, Take 1 tablet by mouth daily., Disp: , Rfl:  ?  aspirin 81 MG EC tablet, Take 81 mg by mouth daily., Disp: , Rfl:  ?  atorvastatin (LIPITOR) 80 MG tablet, SMARTSIG:1 Tablet(s) By Mouth Every Evening,  Disp: , Rfl:  ?  CALCIUM-VITAMIN D PO, Take 1 capsule by mouth every morning. Calcium 600 mg  Vitamin D 200 (Patient not taking: Reported on 06/17/2021), Disp: , Rfl:  ?  Cholecalciferol (VITAMIN D) 2000 UNITS tablet, Take 2,000 Units by mouth daily., Disp: , Rfl:  ?  cyanocobalamin (,VITAMIN B-12,) 1000 MCG/ML injection, Inject 1 cc every month into your muscle, Disp: 3 mL, Rfl: 24 ?  esomeprazole (NEXIUM) 20 MG capsule, Take 20 mg by mouth daily at 12 noon., Disp: , Rfl:  ?  hydroxychloroquine (PLAQUENIL) 200 MG tablet, 200 mg daily. , Disp: , Rfl:  ?  leflunomide (ARAVA) 10 MG tablet, Take 10 mg by mouth daily. Take 1 and 1/2 tablet total of 15 mg daily., Disp: , Rfl:  ?  levothyroxine (SYNTHROID, LEVOTHROID) 125 MCG tablet, 125 mcg daily. 08/31/2019 One day each week, will increase to 1.5 tabs., Disp: , Rfl:  ?  metoprolol succinate (TOPROL-XL) 25 MG 24 hr tablet, Take 25 mg by mouth daily. (Patient not taking: Reported on 10/10/2021), Disp: , Rfl:  ?  prednisoLONE acetate (PRED FORTE) 1 % ophthalmic suspension, Place 1 drop into both eyes daily., Disp: , Rfl:  ?  sertraline (ZOLOFT) 50 MG tablet, Take 50 mg by mouth daily., Disp: , Rfl:  ?  umeclidinium-vilanterol (ANORO ELLIPTA) 62.5-25 MCG/INH AEPB, Inhale into the lungs daily. , Disp: , Rfl:  ?  valsartan (DIOVAN) 320 MG tablet, Take by mouth daily. , Disp: , Rfl:  ? ?Allergies:  ?Allergies  ?Allergen Reactions  ? Iodinated Contrast Media Swelling  ? Sulfa Antibiotics Other (See Comments)  ?  Drops Blood Pressure  ? Spironolactone Rash  ?  Rash with itching unresponsive to benadryl.   ? Methotrexate Other (See Comments)  ? Doxycycline Nausea Only  ? Penicillins Rash  ?  Patient tolerated cefazolin on 01/10/21  ? Quinine Derivatives Other (See Comments)  ?  Patient doesn't remember reaction  ? ? ?Past Medical History, Surgical history, Social history, and Family History were reviewed and updated. ? ?Review of Systems: ?Review of Systems  ?Constitutional:   Positive for malaise/fatigue.  ?HENT:  Positive for congestion.   ?Eyes: Negative.   ?Respiratory:  Positive for cough.   ?Cardiovascular:  Positive for palpitations.  ?Gastrointestinal:  Positive for diarrhea and nausea.  ?Genitourinary: Negative.   ?Musculoskeletal:  Positive for joint pain and myalgias.  ?Skin: Negative.   ?Neurological: Negative.   ?Endo/Heme/Allergies: Negative.   ?Psychiatric/Behavioral: Negative.    ? ? ?Physical Exam: ? vitals were not taken for this visit.  ? ?Physical Exam ?Vitals reviewed.  ?HENT:  ?   Head: Normocephalic and atraumatic.  ?Eyes:  ?   Pupils: Pupils are equal, round, and reactive to light.  ?Cardiovascular:  ?   Rate and Rhythm: Normal rate and regular rhythm.  ?   Heart sounds: Normal heart sounds.  ?Pulmonary:  ?   Effort: Pulmonary effort is normal.  ?   Breath sounds: Normal breath sounds.  ?Abdominal:  ?   General: Bowel sounds are normal.  ?   Palpations: Abdomen is soft.  ?Musculoskeletal:     ?   General: No tenderness or deformity. Normal range of motion.  ?   Cervical back: Normal range of motion.  ?Lymphadenopathy:  ?   Cervical: No cervical adenopathy.  ?Skin: ?   General: Skin is warm and dry.  ?   Findings: No erythema or rash.  ?Neurological:  ?   Mental Status: She is alert and oriented to person, place, and time.  ?Psychiatric:     ?   Behavior: Behavior normal.     ?   Thought Content: Thought content normal.     ?   Judgment: Judgment normal.  ? ? ? ?Lab Results  ?Component Value Date  ? WBC 4.3 02/13/2022  ? HGB 12.2 02/13/2022  ? HCT 37.8 02/13/2022  ? MCV 102.4 (H) 02/13/2022  ? PLT 295 02/13/2022  ? ?  Chemistry   ?   ?Component Value Date/Time  ? NA 139 01/02/2022 1148  ? NA 144 09/11/2017 1335  ? NA 138 07/31/2016 0947  ? K 4.4 01/02/2022 1148  ? K 4.2 09/11/2017 1335  ? K 4.3 07/31/2016 0947  ? CL 102 01/02/2022 1148  ? CL 104 09/11/2017 1335  ? CO2 28 01/02/2022 1148  ? CO2 28 09/11/2017 1335  ? CO2 27 07/31/2016 0947  ? BUN 14 01/02/2022 1148  ?  BUN 17 09/11/2017 1335  ? BUN 14.7 07/31/2016 0947  ? CREATININE 0.67 01/02/2022 1148  ? CREATININE 0.9 09/11/2017 1335  ? CREATININE 0.7 07/31/2016 0947  ?    ?Component Value Date/Time  ? CALCIUM 10.0 01/02/2022 1148  ? CALCIUM 9.0 09/11/2017 1335  ? CALCIUM 9.2 07/31/2016 0947  ? ALKPHOS 123  01/02/2022 1148  ? ALKPHOS 102 (H) 09/11/2017 1335  ? ALKPHOS 70 07/31/2016 0947  ? AST 33 01/02/2022 1148  ? AST 18 07/31/2016 0947  ? ALT 19 01/02/2022 1148  ? ALT 20 09/11/2017 1335  ? ALT 9 07/31/2016 0947  ? BILITOT 0.4 01/02/2022 1148  ? BILITOT 0.62 07/31/2016 0947  ?  ? ? ?Impression and Plan: ? ?Ms. Kathleen Pace is 81 year old white female. She has a clear autoimmune issue. She has pernicious anemia. She has vitiligo. She has hypo-thyroidism.  She is on Plaquenil. ? ?I have to believe that her iron level should be okay.  Her hemoglobin has come up some nicely. ? ?Hopefully, there is nothing that can be found that is can be significant with her abdomen.  I wonder if she has Helicobacter infection that could be causing some of the issues. ? ?For right now, we will just plan to get her back in another couple months or so. ? ? ?Volanda Napoleon, MD ?4/27/202312:04 PM ?

## 2022-03-13 ENCOUNTER — Other Ambulatory Visit: Payer: Self-pay | Admitting: Hematology & Oncology

## 2022-03-13 DIAGNOSIS — D510 Vitamin B12 deficiency anemia due to intrinsic factor deficiency: Secondary | ICD-10-CM

## 2022-03-14 ENCOUNTER — Encounter: Payer: Self-pay | Admitting: Hematology & Oncology

## 2022-04-28 ENCOUNTER — Inpatient Hospital Stay: Payer: Medicare HMO | Admitting: Hematology & Oncology

## 2022-04-28 ENCOUNTER — Other Ambulatory Visit: Payer: Self-pay

## 2022-04-28 ENCOUNTER — Inpatient Hospital Stay: Payer: Medicare HMO | Attending: Hematology & Oncology

## 2022-04-28 ENCOUNTER — Encounter: Payer: Self-pay | Admitting: Hematology & Oncology

## 2022-04-28 VITALS — BP 144/62 | HR 77 | Temp 98.2°F | Resp 20 | Wt 103.0 lb

## 2022-04-28 DIAGNOSIS — D500 Iron deficiency anemia secondary to blood loss (chronic): Secondary | ICD-10-CM

## 2022-04-28 DIAGNOSIS — M069 Rheumatoid arthritis, unspecified: Secondary | ICD-10-CM | POA: Insufficient documentation

## 2022-04-28 DIAGNOSIS — L80 Vitiligo: Secondary | ICD-10-CM | POA: Diagnosis not present

## 2022-04-28 DIAGNOSIS — D509 Iron deficiency anemia, unspecified: Secondary | ICD-10-CM | POA: Diagnosis not present

## 2022-04-28 DIAGNOSIS — Z79890 Hormone replacement therapy (postmenopausal): Secondary | ICD-10-CM | POA: Insufficient documentation

## 2022-04-28 DIAGNOSIS — Z7969 Long term (current) use of other immunomodulators and immunosuppressants: Secondary | ICD-10-CM | POA: Diagnosis not present

## 2022-04-28 DIAGNOSIS — Z79899 Other long term (current) drug therapy: Secondary | ICD-10-CM | POA: Insufficient documentation

## 2022-04-28 DIAGNOSIS — G629 Polyneuropathy, unspecified: Secondary | ICD-10-CM | POA: Insufficient documentation

## 2022-04-28 DIAGNOSIS — D693 Immune thrombocytopenic purpura: Secondary | ICD-10-CM | POA: Insufficient documentation

## 2022-04-28 DIAGNOSIS — Z7982 Long term (current) use of aspirin: Secondary | ICD-10-CM | POA: Diagnosis not present

## 2022-04-28 DIAGNOSIS — D510 Vitamin B12 deficiency anemia due to intrinsic factor deficiency: Secondary | ICD-10-CM

## 2022-04-28 DIAGNOSIS — E039 Hypothyroidism, unspecified: Secondary | ICD-10-CM | POA: Diagnosis not present

## 2022-04-28 LAB — CMP (CANCER CENTER ONLY)
ALT: 19 U/L (ref 0–44)
AST: 35 U/L (ref 15–41)
Albumin: 4.2 g/dL (ref 3.5–5.0)
Alkaline Phosphatase: 135 U/L — ABNORMAL HIGH (ref 38–126)
Anion gap: 6 (ref 5–15)
BUN: 17 mg/dL (ref 8–23)
CO2: 27 mmol/L (ref 22–32)
Calcium: 9.5 mg/dL (ref 8.9–10.3)
Chloride: 100 mmol/L (ref 98–111)
Creatinine: 0.68 mg/dL (ref 0.44–1.00)
GFR, Estimated: >60 mL/min (ref >60)
Glucose, Bld: 67 mg/dL — ABNORMAL LOW (ref 70–99)
Potassium: 4.4 mmol/L (ref 3.5–5.1)
Sodium: 133 mmol/L — ABNORMAL LOW (ref 135–145)
Total Bilirubin: 0.5 mg/dL (ref 0.3–1.2)
Total Protein: 6.7 g/dL (ref 6.5–8.1)

## 2022-04-28 LAB — IRON AND IRON BINDING CAPACITY (CC-WL,HP ONLY)
Iron: 73 ug/dL (ref 28–170)
Saturation Ratios: 22 % (ref 10.4–31.8)
TIBC: 339 ug/dL (ref 250–450)
UIBC: 266 ug/dL (ref 148–442)

## 2022-04-28 LAB — CBC WITH DIFFERENTIAL (CANCER CENTER ONLY)
Abs Immature Granulocytes: 0.05 10*3/uL (ref 0.00–0.07)
Basophils Absolute: 0.1 10*3/uL (ref 0.0–0.1)
Basophils Relative: 2 %
Eosinophils Absolute: 0.1 10*3/uL (ref 0.0–0.5)
Eosinophils Relative: 1 %
HCT: 37.5 % (ref 36.0–46.0)
Hemoglobin: 11.9 g/dL — ABNORMAL LOW (ref 12.0–15.0)
Immature Granulocytes: 1 %
Lymphocytes Relative: 32 %
Lymphs Abs: 1.7 10*3/uL (ref 0.7–4.0)
MCH: 32.2 pg (ref 26.0–34.0)
MCHC: 31.7 g/dL (ref 30.0–36.0)
MCV: 101.6 fL — ABNORMAL HIGH (ref 80.0–100.0)
Monocytes Absolute: 1 10*3/uL (ref 0.1–1.0)
Monocytes Relative: 19 %
Neutro Abs: 2.4 10*3/uL (ref 1.7–7.7)
Neutrophils Relative %: 45 %
Platelet Count: 335 10*3/uL (ref 150–400)
RBC: 3.69 MIL/uL — ABNORMAL LOW (ref 3.87–5.11)
RDW: 13.9 % (ref 11.5–15.5)
WBC Count: 5.2 10*3/uL (ref 4.0–10.5)
nRBC: 0 % (ref 0.0–0.2)

## 2022-04-28 LAB — RETICULOCYTES
Immature Retic Fract: 7.4 % (ref 2.3–15.9)
RBC.: 3.67 MIL/uL — ABNORMAL LOW (ref 3.87–5.11)
Retic Count, Absolute: 77.1 10*3/uL (ref 19.0–186.0)
Retic Ct Pct: 2.1 % (ref 0.4–3.1)

## 2022-04-28 LAB — VITAMIN B12: Vitamin B-12: 363 pg/mL (ref 180–914)

## 2022-04-28 LAB — FERRITIN: Ferritin: 43 ng/mL (ref 11–307)

## 2022-04-28 NOTE — Progress Notes (Signed)
Hematology and Oncology Follow Up Visit  DOROTHEY OETKEN [id number] November 07, 1940 81 y.o. 04/28/2022   Principle Diagnosis:  Chronic immune thrombocytopenia, remission. 2. Pernicious anemia. 3. Vitiligo. 4. Rheumatoid Arthritis 5. Iron Deficiency Anemia  Current Therapy:   Vitamin B12 1 mg IM Q. month  IV Iron as needed --Monoferric given on 01/08/2022      Interim History:  Ms.  Pevey is back for follow-up.  She is still having some abdominal issues.  She did have a CT scan of the abdomen.  This was in early May.  This really did not show much of anything.  She does have a splenectomy.  She says the abdominal issues are not as bad.  Patient complained of some numbness in the feet.  I will know if this might be neuropathy.  I know she has a lot of spinal issues.  It is possible she may have little bit of spinal stenosis.  She does have pernicious anemia.  Her B12 level and March was 353.  Her last iron studies back in March showed a ferritin of 83 with an iron saturation of 11%.  She did have a dose of Monoferric.  Her appetite is okay.  She has had no nausea or vomiting.  Overall, I would say performance status is probably ECOG 2.   Medications:  Current Outpatient Medications:    acetaminophen (TYLENOL) 650 MG CR tablet, Take by mouth., Disp: , Rfl:    albuterol (PROVENTIL HFA;VENTOLIN HFA) 108 (90 Base) MCG/ACT inhaler, Inhale 2 puffs into the lungs every 4 (four) hours as needed., Disp: , Rfl:    ALPRAZolam (XANAX) 0.25 MG tablet, Take 0.25 mg by mouth 2 (two) times daily as needed. (Patient not taking: Reported on 10/10/2021), Disp: , Rfl:    amLODipine (NORVASC) 10 MG tablet, Take 10 mg by mouth daily., Disp: , Rfl:    aspirin 81 MG EC tablet, Take 1 tablet by mouth daily., Disp: , Rfl:    atorvastatin (LIPITOR) 80 MG tablet, SMARTSIG:1 Tablet(s) By Mouth Every Evening, Disp: , Rfl:    CALCIUM-VITAMIN D PO, Take 1 capsule by mouth every morning. Calcium 600 mg  Vitamin D  200 (Patient not taking: Reported on 06/17/2021), Disp: , Rfl:    Cholecalciferol (VITAMIN D) 2000 UNITS tablet, Take 2,000 Units by mouth daily., Disp: , Rfl:    cyanocobalamin (,VITAMIN B-12,) 1000 MCG/ML injection, INJECT 1 CC EVERY MONTH INTO YOUR MUSCLE, Disp: 3 mL, Rfl: 24   esomeprazole (NEXIUM) 20 MG capsule, Take 20 mg by mouth daily at 12 noon., Disp: , Rfl:    hydroxychloroquine (PLAQUENIL) 200 MG tablet, 200 mg daily. , Disp: , Rfl:    leflunomide (ARAVA) 10 MG tablet, Take 10 mg by mouth daily. Take 1 and 1/2 tablet total of 15 mg daily., Disp: , Rfl:    levothyroxine (SYNTHROID, LEVOTHROID) 125 MCG tablet, 125 mcg daily. 08/31/2019 One day each week, will increase to 1.5 tabs., Disp: , Rfl:    metoprolol succinate (TOPROL-XL) 25 MG 24 hr tablet, Take 25 mg by mouth daily. (Patient not taking: Reported on 10/10/2021), Disp: , Rfl:    ondansetron (ZOFRAN-ODT) 8 MG disintegrating tablet, Take 8 mg by mouth every 8 (eight) hours as needed., Disp: , Rfl:    prednisoLONE acetate (PRED FORTE) 1 % ophthalmic suspension, Place 1 drop into both eyes daily., Disp: , Rfl:    sertraline (ZOLOFT) 50 MG tablet, Take 50 mg by mouth daily., Disp: , Rfl:  umeclidinium-vilanterol (ANORO ELLIPTA) 62.5-25 MCG/INH AEPB, Inhale into the lungs daily. , Disp: , Rfl:    valsartan (DIOVAN) 320 MG tablet, Take by mouth daily. , Disp: , Rfl:   Allergies:  Allergies  Allergen Reactions   Iodinated Contrast Media Swelling   Sulfa Antibiotics Other (See Comments)    Drops Blood Pressure   Spironolactone Rash    Rash with itching unresponsive to benadryl.    Methotrexate Other (See Comments)   Doxycycline Nausea Only   Penicillins Rash    Patient tolerated cefazolin on 01/10/21   Quinine Derivatives Other (See Comments)    Patient doesn't remember reaction    Past Medical History, Surgical history, Social history, and Family History were reviewed and updated.  Review of Systems: Review of Systems   Constitutional:  Positive for malaise/fatigue.  HENT:  Positive for congestion.   Eyes: Negative.   Respiratory:  Positive for cough.   Cardiovascular:  Positive for palpitations.  Gastrointestinal:  Positive for diarrhea and nausea.  Genitourinary: Negative.   Musculoskeletal:  Positive for joint pain and myalgias.  Skin: Negative.   Neurological: Negative.   Endo/Heme/Allergies: Negative.   Psychiatric/Behavioral: Negative.       Physical Exam:  weight is 103 lb (46.7 kg). Her oral temperature is 98.2 F (36.8 C). Her blood pressure is 144/62 (abnormal) and her pulse is 77. Her respiration is 20 and oxygen saturation is 99%.   Physical Exam Vitals reviewed.  HENT:     Head: Normocephalic and atraumatic.  Eyes:     Pupils: Pupils are equal, round, and reactive to light.  Cardiovascular:     Rate and Rhythm: Normal rate and regular rhythm.     Heart sounds: Normal heart sounds.  Pulmonary:     Effort: Pulmonary effort is normal.     Breath sounds: Normal breath sounds.  Abdominal:     General: Bowel sounds are normal.     Palpations: Abdomen is soft.  Musculoskeletal:        General: No tenderness or deformity. Normal range of motion.     Cervical back: Normal range of motion.  Lymphadenopathy:     Cervical: No cervical adenopathy.  Skin:    General: Skin is warm and dry.     Findings: No erythema or rash.  Neurological:     Mental Status: She is alert and oriented to person, place, and time.  Psychiatric:        Behavior: Behavior normal.        Thought Content: Thought content normal.        Judgment: Judgment normal.      Lab Results  Component Value Date   WBC 5.2 04/28/2022   HGB 11.9 (L) 04/28/2022   HCT 37.5 04/28/2022   MCV 101.6 (H) 04/28/2022   PLT 335 04/28/2022     Chemistry      Component Value Date/Time   NA 133 (L) 04/28/2022 1151   NA 144 09/11/2017 1335   NA 138 07/31/2016 0947   K 4.4 04/28/2022 1151   K 4.2 09/11/2017 1335   K  4.3 07/31/2016 0947   CL 100 04/28/2022 1151   CL 104 09/11/2017 1335   CO2 27 04/28/2022 1151   CO2 28 09/11/2017 1335   CO2 27 07/31/2016 0947   BUN 17 04/28/2022 1151   BUN 17 09/11/2017 1335   BUN 14.7 07/31/2016 0947   CREATININE 0.68 04/28/2022 1151   CREATININE 0.9 09/11/2017 1335   CREATININE 0.7 07/31/2016  4784      Component Value Date/Time   CALCIUM 9.5 04/28/2022 1151   CALCIUM 9.0 09/11/2017 1335   CALCIUM 9.2 07/31/2016 0947   ALKPHOS 135 (H) 04/28/2022 1151   ALKPHOS 102 (H) 09/11/2017 1335   ALKPHOS 70 07/31/2016 0947   AST 35 04/28/2022 1151   AST 18 07/31/2016 0947   ALT 19 04/28/2022 1151   ALT 20 09/11/2017 1335   ALT 9 07/31/2016 0947   BILITOT 0.5 04/28/2022 1151   BILITOT 0.62 07/31/2016 0947      Impression and Plan:  Ms. Iman is 81 year old white female. She has a clear autoimmune issue. She has pernicious anemia. She has vitiligo. She has hypo-thyroidism.  She is on Plaquenil.  She now has what looks like a peripheral neuropathy.  I am sure that her family doctor will be able to do nerve conduction studies to see if that is truly the case.  We will see what her iron levels are.  I have to believe that they are going to be okay for right now.  Hopefully, we can move her appointments out a little bit longer.  Maybe, we will plan to get her back in another 4 months.  I would like to get her back before all the holidays in the Fall.   Volanda Napoleon, MD 7/10/20231:26 PM

## 2022-04-29 ENCOUNTER — Telehealth: Payer: Self-pay | Admitting: *Deleted

## 2022-04-29 NOTE — Telephone Encounter (Addendum)
-----   Message from Volanda Napoleon, MD sent at 04/28/2022  5:47 PM EDT ----- Called patient to let her know that the iron studies and vitamin B12 look okay.

## 2022-05-01 ENCOUNTER — Other Ambulatory Visit: Payer: Medicare HMO

## 2022-05-01 ENCOUNTER — Ambulatory Visit: Payer: Medicare HMO | Admitting: Hematology & Oncology

## 2022-05-08 ENCOUNTER — Ambulatory Visit: Payer: Medicare HMO | Admitting: Hematology & Oncology

## 2022-05-08 ENCOUNTER — Other Ambulatory Visit: Payer: Medicare HMO

## 2022-08-04 ENCOUNTER — Inpatient Hospital Stay: Payer: Medicare HMO | Admitting: Hematology & Oncology

## 2022-08-04 ENCOUNTER — Encounter: Payer: Self-pay | Admitting: Hematology & Oncology

## 2022-08-04 ENCOUNTER — Inpatient Hospital Stay: Payer: Medicare HMO | Attending: Hematology & Oncology

## 2022-08-04 ENCOUNTER — Telehealth: Payer: Self-pay | Admitting: *Deleted

## 2022-08-04 ENCOUNTER — Other Ambulatory Visit: Payer: Self-pay | Admitting: *Deleted

## 2022-08-04 ENCOUNTER — Other Ambulatory Visit: Payer: Self-pay

## 2022-08-04 VITALS — BP 103/49 | HR 73 | Temp 97.9°F | Resp 19 | Ht 63.0 in | Wt 104.0 lb

## 2022-08-04 DIAGNOSIS — M069 Rheumatoid arthritis, unspecified: Secondary | ICD-10-CM | POA: Insufficient documentation

## 2022-08-04 DIAGNOSIS — Z7969 Long term (current) use of other immunomodulators and immunosuppressants: Secondary | ICD-10-CM | POA: Diagnosis not present

## 2022-08-04 DIAGNOSIS — E039 Hypothyroidism, unspecified: Secondary | ICD-10-CM | POA: Insufficient documentation

## 2022-08-04 DIAGNOSIS — D510 Vitamin B12 deficiency anemia due to intrinsic factor deficiency: Secondary | ICD-10-CM

## 2022-08-04 DIAGNOSIS — R059 Cough, unspecified: Secondary | ICD-10-CM | POA: Insufficient documentation

## 2022-08-04 DIAGNOSIS — D500 Iron deficiency anemia secondary to blood loss (chronic): Secondary | ICD-10-CM

## 2022-08-04 DIAGNOSIS — Z7982 Long term (current) use of aspirin: Secondary | ICD-10-CM | POA: Insufficient documentation

## 2022-08-04 DIAGNOSIS — M7989 Other specified soft tissue disorders: Secondary | ICD-10-CM | POA: Diagnosis not present

## 2022-08-04 DIAGNOSIS — D693 Immune thrombocytopenic purpura: Secondary | ICD-10-CM | POA: Diagnosis present

## 2022-08-04 DIAGNOSIS — Z79899 Other long term (current) drug therapy: Secondary | ICD-10-CM | POA: Diagnosis not present

## 2022-08-04 DIAGNOSIS — Z79890 Hormone replacement therapy (postmenopausal): Secondary | ICD-10-CM | POA: Diagnosis not present

## 2022-08-04 DIAGNOSIS — L80 Vitiligo: Secondary | ICD-10-CM | POA: Diagnosis not present

## 2022-08-04 DIAGNOSIS — D509 Iron deficiency anemia, unspecified: Secondary | ICD-10-CM | POA: Insufficient documentation

## 2022-08-04 LAB — CBC WITH DIFFERENTIAL (CANCER CENTER ONLY)
Abs Immature Granulocytes: 0.01 10*3/uL (ref 0.00–0.07)
Basophils Absolute: 0.1 10*3/uL (ref 0.0–0.1)
Basophils Relative: 2 %
Eosinophils Absolute: 0.1 10*3/uL (ref 0.0–0.5)
Eosinophils Relative: 2 %
HCT: 40 % (ref 36.0–46.0)
Hemoglobin: 12.8 g/dL (ref 12.0–15.0)
Immature Granulocytes: 0 %
Lymphocytes Relative: 50 %
Lymphs Abs: 3 10*3/uL (ref 0.7–4.0)
MCH: 32.1 pg (ref 26.0–34.0)
MCHC: 32 g/dL (ref 30.0–36.0)
MCV: 100.3 fL — ABNORMAL HIGH (ref 80.0–100.0)
Monocytes Absolute: 1 10*3/uL (ref 0.1–1.0)
Monocytes Relative: 17 %
Neutro Abs: 1.7 10*3/uL (ref 1.7–7.7)
Neutrophils Relative %: 29 %
Platelet Count: 253 10*3/uL (ref 150–400)
RBC: 3.99 MIL/uL (ref 3.87–5.11)
RDW: 15 % (ref 11.5–15.5)
WBC Count: 5.9 10*3/uL (ref 4.0–10.5)
nRBC: 0 % (ref 0.0–0.2)

## 2022-08-04 LAB — CMP (CANCER CENTER ONLY)
ALT: 19 U/L (ref 0–44)
AST: 29 U/L (ref 15–41)
Albumin: 4 g/dL (ref 3.5–5.0)
Alkaline Phosphatase: 98 U/L (ref 38–126)
Anion gap: 10 (ref 5–15)
BUN: 16 mg/dL (ref 8–23)
CO2: 28 mmol/L (ref 22–32)
Calcium: 9.6 mg/dL (ref 8.9–10.3)
Chloride: 103 mmol/L (ref 98–111)
Creatinine: 0.86 mg/dL (ref 0.44–1.00)
GFR, Estimated: >60 mL/min (ref >60)
Glucose, Bld: 99 mg/dL (ref 70–99)
Potassium: 4.5 mmol/L (ref 3.5–5.1)
Sodium: 141 mmol/L (ref 135–145)
Total Bilirubin: 0.6 mg/dL (ref 0.3–1.2)
Total Protein: 6.6 g/dL (ref 6.5–8.1)

## 2022-08-04 LAB — RETICULOCYTES
Immature Retic Fract: 5.4 % (ref 2.3–15.9)
RBC.: 3.92 MIL/uL (ref 3.87–5.11)
Retic Count, Absolute: 64.3 10*3/uL (ref 19.0–186.0)
Retic Ct Pct: 1.6 % (ref 0.4–3.1)

## 2022-08-04 LAB — IRON AND IRON BINDING CAPACITY (CC-WL,HP ONLY)
Iron: 109 ug/dL (ref 28–170)
Saturation Ratios: 31 % (ref 10.4–31.8)
TIBC: 351 ug/dL (ref 250–450)
UIBC: 242 ug/dL (ref 148–442)

## 2022-08-04 LAB — MAGNESIUM: Magnesium: 1.8 mg/dL (ref 1.7–2.4)

## 2022-08-04 LAB — FERRITIN: Ferritin: 37 ng/mL (ref 11–307)

## 2022-08-04 LAB — VITAMIN B12: Vitamin B-12: 450 pg/mL (ref 180–914)

## 2022-08-04 NOTE — Progress Notes (Signed)
Hematology and Oncology Follow Up Visit  Kathleen Pace [id number] 03-Jun-1941 81 y.o. 08/04/2022   Principle Diagnosis:  Chronic immune thrombocytopenia, remission. 2. Pernicious anemia. 3. Vitiligo. 4. Rheumatoid Arthritis 5. Iron Deficiency Anemia  Current Therapy:   Vitamin B12 1 mg IM Q. month  IV Iron as needed --Monoferric given on 01/08/2022      Interim History:  Ms.  Pace is back for follow-up.  She looks fantastic.  She is doing pretty well.  We last saw her back in July.  At that time, her iron studies showed a ferritin of 43 with an iron saturation of 22%.  Her B12 level was 363.  She has had no problems with nausea or vomiting.  She has had no cough or shortness of breath.  She has had no change in bowel or bladder habits.  She has had no abdominal pain.  There is been a little bit of swelling in the legs.  She says she has had her medications changed a little bit.  She has had no swollen lymph nodes.  She had little bit of a cough last week which is a little bit better.  Overall, I would say her performance status is probably ECOG 2.    Medications:  Current Outpatient Medications:    acetaminophen (TYLENOL) 650 MG CR tablet, Take by mouth., Disp: , Rfl:    albuterol (PROVENTIL HFA;VENTOLIN HFA) 108 (90 Base) MCG/ACT inhaler, Inhale 2 puffs into the lungs every 4 (four) hours as needed., Disp: , Rfl:    ALPRAZolam (XANAX) 0.25 MG tablet, Take 0.25 mg by mouth 2 (two) times daily as needed. (Patient not taking: Reported on 10/10/2021), Disp: , Rfl:    amLODipine (NORVASC) 10 MG tablet, Take 10 mg by mouth daily., Disp: , Rfl:    aspirin 81 MG EC tablet, Take 1 tablet by mouth daily., Disp: , Rfl:    atorvastatin (LIPITOR) 80 MG tablet, SMARTSIG:1 Tablet(s) By Mouth Every Evening, Disp: , Rfl:    CALCIUM-VITAMIN D PO, Take 1 capsule by mouth every morning. Calcium 600 mg  Vitamin D 200 (Patient not taking: Reported on 06/17/2021), Disp: , Rfl:    Cholecalciferol  (VITAMIN D) 2000 UNITS tablet, Take 2,000 Units by mouth daily., Disp: , Rfl:    cyanocobalamin (,VITAMIN B-12,) 1000 MCG/ML injection, INJECT 1 CC EVERY MONTH INTO YOUR MUSCLE, Disp: 3 mL, Rfl: 24   esomeprazole (NEXIUM) 20 MG capsule, Take 20 mg by mouth daily at 12 noon., Disp: , Rfl:    hydroxychloroquine (PLAQUENIL) 200 MG tablet, 200 mg daily. , Disp: , Rfl:    leflunomide (ARAVA) 10 MG tablet, Take 10 mg by mouth daily. Take 1 and 1/2 tablet total of 15 mg daily., Disp: , Rfl:    levothyroxine (SYNTHROID, LEVOTHROID) 125 MCG tablet, 125 mcg daily. 08/31/2019 One day each week, will increase to 1.5 tabs., Disp: , Rfl:    metoprolol succinate (TOPROL-XL) 25 MG 24 hr tablet, Take 25 mg by mouth daily. (Patient not taking: Reported on 10/10/2021), Disp: , Rfl:    ondansetron (ZOFRAN-ODT) 8 MG disintegrating tablet, Take 8 mg by mouth every 8 (eight) hours as needed., Disp: , Rfl:    prednisoLONE acetate (PRED FORTE) 1 % ophthalmic suspension, Place 1 drop into both eyes daily., Disp: , Rfl:    sertraline (ZOLOFT) 50 MG tablet, Take 50 mg by mouth daily., Disp: , Rfl:    umeclidinium-vilanterol (ANORO ELLIPTA) 62.5-25 MCG/INH AEPB, Inhale into the lungs daily. , Disp: ,  Rfl:    valsartan (DIOVAN) 320 MG tablet, Take by mouth daily. , Disp: , Rfl:   Allergies:  Allergies  Allergen Reactions   Iodinated Contrast Media Swelling   Sulfa Antibiotics Other (See Comments)    Drops Blood Pressure   Spironolactone Rash    Rash with itching unresponsive to benadryl.    Methotrexate Other (See Comments)   Doxycycline Nausea Only   Penicillins Rash    Patient tolerated cefazolin on 01/10/21   Quinine Derivatives Other (See Comments)    Patient doesn't remember reaction    Past Medical History, Surgical history, Social history, and Family History were reviewed and updated.  Review of Systems: Review of Systems  Constitutional:  Positive for malaise/fatigue.  HENT:  Positive for congestion.    Eyes: Negative.   Respiratory:  Positive for cough.   Cardiovascular:  Positive for palpitations.  Gastrointestinal:  Positive for diarrhea and nausea.  Genitourinary: Negative.   Musculoskeletal:  Positive for joint pain and myalgias.  Skin: Negative.   Neurological: Negative.   Endo/Heme/Allergies: Negative.   Psychiatric/Behavioral: Negative.       Physical Exam:  height is '5\' 3"'$  (1.6 m) and weight is 104 lb (47.2 kg). Her oral temperature is 97.9 F (36.6 C). Her blood pressure is 103/49 (abnormal) and her pulse is 73. Her respiration is 19 and oxygen saturation is 96%.   Physical Exam Vitals reviewed.  HENT:     Head: Normocephalic and atraumatic.  Eyes:     Pupils: Pupils are equal, round, and reactive to light.  Cardiovascular:     Rate and Rhythm: Normal rate and regular rhythm.     Heart sounds: Normal heart sounds.  Pulmonary:     Effort: Pulmonary effort is normal.     Breath sounds: Normal breath sounds.  Abdominal:     General: Bowel sounds are normal.     Palpations: Abdomen is soft.  Musculoskeletal:        General: No tenderness or deformity. Normal range of motion.     Cervical back: Normal range of motion.  Lymphadenopathy:     Cervical: No cervical adenopathy.  Skin:    General: Skin is warm and dry.     Findings: No erythema or rash.  Neurological:     Mental Status: She is alert and oriented to person, place, and time.  Psychiatric:        Behavior: Behavior normal.        Thought Content: Thought content normal.        Judgment: Judgment normal.      Lab Results  Component Value Date   WBC 5.9 08/04/2022   HGB 12.8 08/04/2022   HCT 40.0 08/04/2022   MCV 100.3 (H) 08/04/2022   PLT 253 08/04/2022     Chemistry      Component Value Date/Time   NA 141 08/04/2022 1132   NA 144 09/11/2017 1335   NA 138 07/31/2016 0947   K 4.5 08/04/2022 1132   K 4.2 09/11/2017 1335   K 4.3 07/31/2016 0947   CL 103 08/04/2022 1132   CL 104  09/11/2017 1335   CO2 28 08/04/2022 1132   CO2 28 09/11/2017 1335   CO2 27 07/31/2016 0947   BUN 16 08/04/2022 1132   BUN 17 09/11/2017 1335   BUN 14.7 07/31/2016 0947   CREATININE 0.86 08/04/2022 1132   CREATININE 0.9 09/11/2017 1335   CREATININE 0.7 07/31/2016 0947      Component Value Date/Time  CALCIUM 9.6 08/04/2022 1132   CALCIUM 9.0 09/11/2017 1335   CALCIUM 9.2 07/31/2016 0947   ALKPHOS 98 08/04/2022 1132   ALKPHOS 102 (H) 09/11/2017 1335   ALKPHOS 70 07/31/2016 0947   AST 29 08/04/2022 1132   AST 18 07/31/2016 0947   ALT 19 08/04/2022 1132   ALT 20 09/11/2017 1335   ALT 9 07/31/2016 0947   BILITOT 0.6 08/04/2022 1132   BILITOT 0.62 07/31/2016 0947      Impression and Plan:  Ms. Whicker is 81 year old white female. She has a clear autoimmune issue. She has pernicious anemia. She has vitiligo. She has hypothyroidism.  She is on Plaquenil.    I am glad that she is feeling better.  I would think that her iron studies should be okay.  I would think that her vitamin B12 level should also be fine.  We will now get her back after all of the Holidays.  I want her to be able to enjoy the holiday season with her family.    Volanda Napoleon, MD 10/16/202312:31 PM

## 2022-08-04 NOTE — Telephone Encounter (Signed)
Per 08/04/22 los - gave upcoming appointments - confirmed

## 2022-08-05 ENCOUNTER — Telehealth: Payer: Self-pay

## 2022-08-05 NOTE — Telephone Encounter (Signed)
-----   Message from Volanda Napoleon, MD sent at 08/04/2022  5:16 PM EDT ----- Please call and let her know that the iron level and B12 level are both very good.  Thanks.Marland Kitchen

## 2022-08-05 NOTE — Telephone Encounter (Signed)
Called and informed patient of lab results, patient verbalized understanding and denies any questions or concerns at this time.   

## 2022-12-05 ENCOUNTER — Telehealth: Payer: Self-pay | Admitting: *Deleted

## 2022-12-05 ENCOUNTER — Inpatient Hospital Stay: Payer: Medicare HMO | Admitting: Oncology

## 2022-12-05 ENCOUNTER — Inpatient Hospital Stay: Payer: Medicare HMO | Attending: Hematology & Oncology

## 2022-12-05 ENCOUNTER — Encounter: Payer: Self-pay | Admitting: Oncology

## 2022-12-05 ENCOUNTER — Other Ambulatory Visit: Payer: Self-pay

## 2022-12-05 VITALS — BP 119/58 | HR 77 | Temp 97.9°F | Resp 18 | Wt 104.0 lb

## 2022-12-05 DIAGNOSIS — L80 Vitiligo: Secondary | ICD-10-CM | POA: Diagnosis not present

## 2022-12-05 DIAGNOSIS — M7989 Other specified soft tissue disorders: Secondary | ICD-10-CM | POA: Diagnosis not present

## 2022-12-05 DIAGNOSIS — D510 Vitamin B12 deficiency anemia due to intrinsic factor deficiency: Secondary | ICD-10-CM | POA: Insufficient documentation

## 2022-12-05 DIAGNOSIS — Z7982 Long term (current) use of aspirin: Secondary | ICD-10-CM | POA: Diagnosis not present

## 2022-12-05 DIAGNOSIS — Z79899 Other long term (current) drug therapy: Secondary | ICD-10-CM | POA: Diagnosis not present

## 2022-12-05 DIAGNOSIS — D693 Immune thrombocytopenic purpura: Secondary | ICD-10-CM | POA: Diagnosis present

## 2022-12-05 DIAGNOSIS — E538 Deficiency of other specified B group vitamins: Secondary | ICD-10-CM | POA: Diagnosis not present

## 2022-12-05 DIAGNOSIS — E039 Hypothyroidism, unspecified: Secondary | ICD-10-CM | POA: Diagnosis not present

## 2022-12-05 DIAGNOSIS — D509 Iron deficiency anemia, unspecified: Secondary | ICD-10-CM | POA: Insufficient documentation

## 2022-12-05 DIAGNOSIS — D500 Iron deficiency anemia secondary to blood loss (chronic): Secondary | ICD-10-CM

## 2022-12-05 DIAGNOSIS — Z7969 Long term (current) use of other immunomodulators and immunosuppressants: Secondary | ICD-10-CM | POA: Diagnosis not present

## 2022-12-05 DIAGNOSIS — D508 Other iron deficiency anemias: Secondary | ICD-10-CM | POA: Diagnosis not present

## 2022-12-05 DIAGNOSIS — M069 Rheumatoid arthritis, unspecified: Secondary | ICD-10-CM | POA: Insufficient documentation

## 2022-12-05 LAB — CMP (CANCER CENTER ONLY)
ALT: 13 U/L (ref 0–44)
AST: 25 U/L (ref 15–41)
Albumin: 4.1 g/dL (ref 3.5–5.0)
Alkaline Phosphatase: 90 U/L (ref 38–126)
Anion gap: 9 (ref 5–15)
BUN: 20 mg/dL (ref 8–23)
CO2: 28 mmol/L (ref 22–32)
Calcium: 9.6 mg/dL (ref 8.9–10.3)
Chloride: 103 mmol/L (ref 98–111)
Creatinine: 0.89 mg/dL (ref 0.44–1.00)
GFR, Estimated: >60 mL/min (ref >60)
Glucose, Bld: 81 mg/dL (ref 70–99)
Potassium: 4.6 mmol/L (ref 3.5–5.1)
Sodium: 140 mmol/L (ref 135–145)
Total Bilirubin: 0.6 mg/dL (ref 0.3–1.2)
Total Protein: 6.9 g/dL (ref 6.5–8.1)

## 2022-12-05 LAB — IRON AND IRON BINDING CAPACITY (CC-WL,HP ONLY)
Iron: 88 ug/dL (ref 28–170)
Saturation Ratios: 25 % (ref 10.4–31.8)
TIBC: 347 ug/dL (ref 250–450)
UIBC: 259 ug/dL (ref 148–442)

## 2022-12-05 LAB — CBC WITH DIFFERENTIAL (CANCER CENTER ONLY)
Abs Immature Granulocytes: 0.01 10*3/uL (ref 0.00–0.07)
Basophils Absolute: 0.1 10*3/uL (ref 0.0–0.1)
Basophils Relative: 1 %
Eosinophils Absolute: 0.1 10*3/uL (ref 0.0–0.5)
Eosinophils Relative: 2 %
HCT: 40.2 % (ref 36.0–46.0)
Hemoglobin: 13 g/dL (ref 12.0–15.0)
Immature Granulocytes: 0 %
Lymphocytes Relative: 44 %
Lymphs Abs: 2.7 10*3/uL (ref 0.7–4.0)
MCH: 33.2 pg (ref 26.0–34.0)
MCHC: 32.3 g/dL (ref 30.0–36.0)
MCV: 102.6 fL — ABNORMAL HIGH (ref 80.0–100.0)
Monocytes Absolute: 1 10*3/uL (ref 0.1–1.0)
Monocytes Relative: 16 %
Neutro Abs: 2.2 10*3/uL (ref 1.7–7.7)
Neutrophils Relative %: 37 %
Platelet Count: 278 10*3/uL (ref 150–400)
RBC: 3.92 MIL/uL (ref 3.87–5.11)
RDW: 13.3 % (ref 11.5–15.5)
WBC Count: 6.1 10*3/uL (ref 4.0–10.5)
nRBC: 0 % (ref 0.0–0.2)

## 2022-12-05 LAB — RETICULOCYTES
Immature Retic Fract: 4.2 % (ref 2.3–15.9)
RBC.: 3.9 MIL/uL (ref 3.87–5.11)
Retic Count, Absolute: 85 10*3/uL (ref 19.0–186.0)
Retic Ct Pct: 2.2 % (ref 0.4–3.1)

## 2022-12-05 LAB — VITAMIN B12: Vitamin B-12: 326 pg/mL (ref 180–914)

## 2022-12-05 LAB — FERRITIN: Ferritin: 25 ng/mL (ref 11–307)

## 2022-12-05 NOTE — Progress Notes (Signed)
Hematology and Oncology Follow Up Visit  JANEISY GUIDROZ [id number] [birth year] 82 y.o. 12/05/2022   Principle Diagnosis:  Chronic immune thrombocytopenia, remission. 2. Pernicious anemia. 3. Vitiligo. 4. Rheumatoid Arthritis 5. Iron Deficiency Anemia  Current Therapy:   Vitamin B12 1 mg IM Q. month  IV Iron as needed --Monoferric given on 01/08/2022      Interim History:  Ms.  Remedies is back for follow-up.  Overall she is doing well, but reports that she fatigues easily. Has some dyspnea with exertion. We last saw her back in October.  At that time, her iron studies showed a ferritin of 37 with an iron saturation of 31%.  Her B12 level was 450.  She has had no problems with nausea or vomiting.  She has had no cough or shortness of breath.  She has had no change in bowel or bladder habits.  She has had no abdominal pain.  There is been a little bit of swelling in the legs.  She says she has had her medications changed a little bit.  She has had no swollen lymph nodes.    Overall, I would say her performance status is probably ECOG 2.    Medications:  Current Outpatient Medications:    acetaminophen (TYLENOL) 650 MG CR tablet, Take by mouth., Disp: , Rfl:    albuterol (PROVENTIL HFA;VENTOLIN HFA) 108 (90 Base) MCG/ACT inhaler, Inhale 2 puffs into the lungs every 4 (four) hours as needed., Disp: , Rfl:    ALPRAZolam (XANAX) 0.25 MG tablet, Take 0.25 mg by mouth 2 (two) times daily as needed. (Patient not taking: Reported on 10/10/2021), Disp: , Rfl:    amLODipine (NORVASC) 10 MG tablet, Take 10 mg by mouth daily., Disp: , Rfl:    aspirin 81 MG EC tablet, Take 1 tablet by mouth daily., Disp: , Rfl:    atorvastatin (LIPITOR) 80 MG tablet, SMARTSIG:1 Tablet(s) By Mouth Every Evening, Disp: , Rfl:    CALCIUM-VITAMIN D PO, Take 1 capsule by mouth every morning. Calcium 600 mg  Vitamin D 200 (Patient not taking: Reported on 06/17/2021), Disp: , Rfl:    Cholecalciferol (VITAMIN D) 2000  UNITS tablet, Take 2,000 Units by mouth daily., Disp: , Rfl:    cyanocobalamin (,VITAMIN B-12,) 1000 MCG/ML injection, INJECT 1 CC EVERY MONTH INTO YOUR MUSCLE, Disp: 3 mL, Rfl: 24   esomeprazole (NEXIUM) 20 MG capsule, Take 20 mg by mouth daily at 12 noon., Disp: , Rfl:    hydroxychloroquine (PLAQUENIL) 200 MG tablet, 200 mg daily. , Disp: , Rfl:    leflunomide (ARAVA) 10 MG tablet, Take 10 mg by mouth daily. Take 1 and 1/2 tablet total of 15 mg daily., Disp: , Rfl:    levothyroxine (SYNTHROID, LEVOTHROID) 125 MCG tablet, 125 mcg daily. 08/31/2019 One day each week, will increase to 1.5 tabs., Disp: , Rfl:    metoprolol succinate (TOPROL-XL) 25 MG 24 hr tablet, Take 25 mg by mouth daily. (Patient not taking: Reported on 10/10/2021), Disp: , Rfl:    ondansetron (ZOFRAN-ODT) 8 MG disintegrating tablet, Take 8 mg by mouth every 8 (eight) hours as needed., Disp: , Rfl:    prednisoLONE acetate (PRED FORTE) 1 % ophthalmic suspension, Place 1 drop into both eyes daily., Disp: , Rfl:    sertraline (ZOLOFT) 50 MG tablet, Take 50 mg by mouth daily., Disp: , Rfl:    umeclidinium-vilanterol (ANORO ELLIPTA) 62.5-25 MCG/INH AEPB, Inhale into the lungs daily. , Disp: , Rfl:    valsartan (DIOVAN) 320  MG tablet, Take by mouth daily. , Disp: , Rfl:   Allergies:  Allergies  Allergen Reactions   Iodinated Contrast Media Swelling   Sulfa Antibiotics Other (See Comments)    Drops Blood Pressure   Spironolactone Rash    Rash with itching unresponsive to benadryl.    Methotrexate Other (See Comments)   Doxycycline Nausea Only   Penicillins Rash    Patient tolerated cefazolin on 01/10/21   Quinine Derivatives Other (See Comments)    Patient doesn't remember reaction    Past Medical History, Surgical history, Social history, and Family History were reviewed and updated.  Review of Systems: Review of Systems  Constitutional:  Positive for malaise/fatigue.  HENT: Negative.    Eyes: Negative.   Respiratory:          Dyspnea with exertion  Cardiovascular: Negative.   Gastrointestinal:  Negative for diarrhea and nausea.  Genitourinary: Negative.   Skin: Negative.   Neurological: Negative.   Endo/Heme/Allergies: Negative.   Psychiatric/Behavioral: Negative.       Physical Exam:  vitals were not taken for this visit.   Physical Exam Vitals reviewed.  HENT:     Head: Normocephalic and atraumatic.  Eyes:     Pupils: Pupils are equal, round, and reactive to light.  Cardiovascular:     Rate and Rhythm: Normal rate and regular rhythm.     Heart sounds: Normal heart sounds.  Pulmonary:     Effort: Pulmonary effort is normal.     Breath sounds: Normal breath sounds.  Abdominal:     General: Bowel sounds are normal.     Palpations: Abdomen is soft.  Musculoskeletal:        General: No tenderness or deformity. Normal range of motion.     Cervical back: Normal range of motion.  Lymphadenopathy:     Cervical: No cervical adenopathy.  Skin:    General: Skin is warm and dry.     Findings: No erythema or rash.  Neurological:     Mental Status: She is alert and oriented to person, place, and time.  Psychiatric:        Behavior: Behavior normal.        Thought Content: Thought content normal.        Judgment: Judgment normal.     Lab Results  Component Value Date   WBC 6.1 12/05/2022   HGB 13.0 12/05/2022   HCT 40.2 12/05/2022   MCV 102.6 (H) 12/05/2022   PLT 278 12/05/2022     Chemistry      Component Value Date/Time   NA 140 12/05/2022 1140   NA 144 09/11/2017 1335   NA 138 07/31/2016 0947   K 4.6 12/05/2022 1140   K 4.2 09/11/2017 1335   K 4.3 07/31/2016 0947   CL 103 12/05/2022 1140   CL 104 09/11/2017 1335   CO2 28 12/05/2022 1140   CO2 28 09/11/2017 1335   CO2 27 07/31/2016 0947   BUN 20 12/05/2022 1140   BUN 17 09/11/2017 1335   BUN 14.7 07/31/2016 0947   CREATININE 0.89 12/05/2022 1140   CREATININE 0.9 09/11/2017 1335   CREATININE 0.7 07/31/2016 0947       Component Value Date/Time   CALCIUM 9.6 12/05/2022 1140   CALCIUM 9.0 09/11/2017 1335   CALCIUM 9.2 07/31/2016 0947   ALKPHOS 90 12/05/2022 1140   ALKPHOS 102 (H) 09/11/2017 1335   ALKPHOS 70 07/31/2016 0947   AST 25 12/05/2022 1140   AST 18 07/31/2016 0947  ALT 13 12/05/2022 1140   ALT 20 09/11/2017 1335   ALT 9 07/31/2016 0947   BILITOT 0.6 12/05/2022 1140   BILITOT 0.62 07/31/2016 0947      Impression and Plan:  Ms. Makley is 82 year old white female. She has a clear autoimmune issue. She has pernicious anemia. She has vitiligo. She has hypothyroidism.  She is on Plaquenil.    I am glad that she is feeling better. Her hemoglobin is normal today. Will follow up on her iron studies and Vitamin B12 level.  Follow-up in about 4 months   Mikey Bussing, NP 2/16/202412:12 PM

## 2022-12-05 NOTE — Telephone Encounter (Signed)
-----   Message from Volanda Napoleon, MD sent at 12/05/2022  4:00 PM EST ----- Please call and let her know that the iron levels are okay.  Thanks.  [name]

## 2022-12-05 NOTE — Telephone Encounter (Signed)
Pt notified per order of Dr. Marin Olp that "the iron levels are okay."  Pt is appreciative of call and has no questions or concerns at this time.

## 2023-04-02 ENCOUNTER — Other Ambulatory Visit: Payer: Self-pay | Admitting: Hematology & Oncology

## 2023-04-02 DIAGNOSIS — D510 Vitamin B12 deficiency anemia due to intrinsic factor deficiency: Secondary | ICD-10-CM

## 2023-04-06 ENCOUNTER — Inpatient Hospital Stay: Payer: Medicare HMO | Attending: Hematology & Oncology

## 2023-04-06 ENCOUNTER — Encounter: Payer: Self-pay | Admitting: Hematology & Oncology

## 2023-04-06 ENCOUNTER — Inpatient Hospital Stay: Payer: Medicare HMO | Admitting: Hematology & Oncology

## 2023-04-06 ENCOUNTER — Other Ambulatory Visit: Payer: Self-pay

## 2023-04-06 VITALS — BP 106/57 | HR 83 | Temp 97.6°F | Resp 20 | Wt 96.0 lb

## 2023-04-06 DIAGNOSIS — Z7982 Long term (current) use of aspirin: Secondary | ICD-10-CM | POA: Insufficient documentation

## 2023-04-06 DIAGNOSIS — G629 Polyneuropathy, unspecified: Secondary | ICD-10-CM | POA: Insufficient documentation

## 2023-04-06 DIAGNOSIS — E039 Hypothyroidism, unspecified: Secondary | ICD-10-CM | POA: Insufficient documentation

## 2023-04-06 DIAGNOSIS — D500 Iron deficiency anemia secondary to blood loss (chronic): Secondary | ICD-10-CM | POA: Diagnosis not present

## 2023-04-06 DIAGNOSIS — Z79899 Other long term (current) drug therapy: Secondary | ICD-10-CM | POA: Diagnosis not present

## 2023-04-06 DIAGNOSIS — D509 Iron deficiency anemia, unspecified: Secondary | ICD-10-CM | POA: Diagnosis not present

## 2023-04-06 DIAGNOSIS — Z7969 Long term (current) use of other immunomodulators and immunosuppressants: Secondary | ICD-10-CM | POA: Insufficient documentation

## 2023-04-06 DIAGNOSIS — M069 Rheumatoid arthritis, unspecified: Secondary | ICD-10-CM | POA: Diagnosis not present

## 2023-04-06 DIAGNOSIS — D508 Other iron deficiency anemias: Secondary | ICD-10-CM

## 2023-04-06 DIAGNOSIS — D510 Vitamin B12 deficiency anemia due to intrinsic factor deficiency: Secondary | ICD-10-CM | POA: Insufficient documentation

## 2023-04-06 DIAGNOSIS — D693 Immune thrombocytopenic purpura: Secondary | ICD-10-CM | POA: Diagnosis present

## 2023-04-06 DIAGNOSIS — L80 Vitiligo: Secondary | ICD-10-CM | POA: Insufficient documentation

## 2023-04-06 DIAGNOSIS — R634 Abnormal weight loss: Secondary | ICD-10-CM | POA: Diagnosis not present

## 2023-04-06 DIAGNOSIS — E538 Deficiency of other specified B group vitamins: Secondary | ICD-10-CM

## 2023-04-06 LAB — CBC WITH DIFFERENTIAL (CANCER CENTER ONLY)
Abs Immature Granulocytes: 0.04 10*3/uL (ref 0.00–0.07)
Basophils Absolute: 0.1 10*3/uL (ref 0.0–0.1)
Basophils Relative: 1 %
Eosinophils Absolute: 0.1 10*3/uL (ref 0.0–0.5)
Eosinophils Relative: 1 %
HCT: 40.7 % (ref 36.0–46.0)
Hemoglobin: 13.1 g/dL (ref 12.0–15.0)
Immature Granulocytes: 1 %
Lymphocytes Relative: 38 %
Lymphs Abs: 2.8 10*3/uL (ref 0.7–4.0)
MCH: 33.7 pg (ref 26.0–34.0)
MCHC: 32.2 g/dL (ref 30.0–36.0)
MCV: 104.6 fL — ABNORMAL HIGH (ref 80.0–100.0)
Monocytes Absolute: 0.9 10*3/uL (ref 0.1–1.0)
Monocytes Relative: 13 %
Neutro Abs: 3.4 10*3/uL (ref 1.7–7.7)
Neutrophils Relative %: 46 %
Platelet Count: 307 10*3/uL (ref 150–400)
RBC: 3.89 MIL/uL (ref 3.87–5.11)
RDW: 13.8 % (ref 11.5–15.5)
WBC Count: 7.3 10*3/uL (ref 4.0–10.5)
nRBC: 0 % (ref 0.0–0.2)

## 2023-04-06 LAB — RETICULOCYTES
Immature Retic Fract: 6.7 % (ref 2.3–15.9)
RBC.: 3.87 MIL/uL (ref 3.87–5.11)
Retic Count, Absolute: 106.4 10*3/uL (ref 19.0–186.0)
Retic Ct Pct: 2.8 % (ref 0.4–3.1)

## 2023-04-06 LAB — CMP (CANCER CENTER ONLY)
ALT: 20 U/L (ref 0–44)
AST: 33 U/L (ref 15–41)
Albumin: 4.3 g/dL (ref 3.5–5.0)
Alkaline Phosphatase: 87 U/L (ref 38–126)
Anion gap: 11 (ref 5–15)
BUN: 22 mg/dL (ref 8–23)
CO2: 28 mmol/L (ref 22–32)
Calcium: 10.5 mg/dL — ABNORMAL HIGH (ref 8.9–10.3)
Chloride: 101 mmol/L (ref 98–111)
Creatinine: 0.94 mg/dL (ref 0.44–1.00)
GFR, Estimated: >60 mL/min (ref >60)
Glucose, Bld: 112 mg/dL — ABNORMAL HIGH (ref 70–99)
Potassium: 4.7 mmol/L (ref 3.5–5.1)
Sodium: 140 mmol/L (ref 135–145)
Total Bilirubin: 0.7 mg/dL (ref 0.3–1.2)
Total Protein: 7.1 g/dL (ref 6.5–8.1)

## 2023-04-06 LAB — IRON AND IRON BINDING CAPACITY (CC-WL,HP ONLY)
Iron: 85 ug/dL (ref 28–170)
Saturation Ratios: 25 % (ref 10.4–31.8)
TIBC: 344 ug/dL (ref 250–450)
UIBC: 259 ug/dL (ref 148–442)

## 2023-04-06 LAB — FERRITIN: Ferritin: 48 ng/mL (ref 11–307)

## 2023-04-06 LAB — VITAMIN B12: Vitamin B-12: 2647 pg/mL — ABNORMAL HIGH (ref 180–914)

## 2023-04-06 NOTE — Progress Notes (Signed)
Hematology and Oncology Follow Up Visit  Kathleen Pace [id number] [birth year] 82 y.o. 04/06/2023   Principle Diagnosis:  Chronic immune thrombocytopenia, remission. 2. Pernicious anemia. 3. Vitiligo. 4. Rheumatoid Arthritis 5. Iron Deficiency Anemia  Current Therapy:   Vitamin B12 1 mg IM Q. month  IV Iron as needed --Monoferric given on 01/08/2022      Interim History:  Ms.  Pace is back for follow-up.  She is still losing some weight.  I am unsure as why she is losing weight.  She does not have much of an appetite.  We have tried her on Megace in the past and she did not like this.  Thankfully, she has had no cardiac issues.  Her rheumatoid arthritis seems to be under decent control.  When we last saw her, her ferritin was 25 with an iron saturation of 25%.  She has had no change in bowel or bladder habits.  There is been no bleeding.  She has had no rashes.  Patient does have some neuropathy in her feet.  I had to believe this is probably from the scoliosis that she has.  She has had no problems with COVID.  Overall, I would say performance status is probably ECOG 2.  .    Medications:  Current Outpatient Medications:    acetaminophen (TYLENOL) 650 MG CR tablet, Take by mouth., Disp: , Rfl:    albuterol (PROVENTIL HFA;VENTOLIN HFA) 108 (90 Base) MCG/ACT inhaler, Inhale 2 puffs into the lungs every 4 (four) hours as needed., Disp: , Rfl:    ALPRAZolam (XANAX) 0.25 MG tablet, Take 0.25 mg by mouth 2 (two) times daily as needed. (Patient not taking: Reported on 10/10/2021), Disp: , Rfl:    amLODipine (NORVASC) 10 MG tablet, Take 10 mg by mouth daily., Disp: , Rfl:    aspirin 81 MG EC tablet, Take 1 tablet by mouth daily., Disp: , Rfl:    atorvastatin (LIPITOR) 80 MG tablet, SMARTSIG:1 Tablet(s) By Mouth Every Evening, Disp: , Rfl:    CALCIUM-VITAMIN D PO, Take 1 capsule by mouth every morning. Calcium 600 mg  Vitamin D 200 (Patient not taking: Reported on 06/17/2021), Disp: ,  Rfl:    Cholecalciferol (VITAMIN D) 2000 UNITS tablet, Take 2,000 Units by mouth daily., Disp: , Rfl:    cyanocobalamin (VITAMIN B12) 1000 MCG/ML injection, INJECT 1 ML EVERY MONTH INTO YOUR MUSCLE, Disp: 3 mL, Rfl: 24   esomeprazole (NEXIUM) 20 MG capsule, Take 20 mg by mouth daily at 12 noon., Disp: , Rfl:    hydroxychloroquine (PLAQUENIL) 200 MG tablet, 200 mg daily. , Disp: , Rfl:    leflunomide (ARAVA) 10 MG tablet, Take 10 mg by mouth daily. Take 1 and 1/2 tablet total of 15 mg daily., Disp: , Rfl:    levothyroxine (SYNTHROID, LEVOTHROID) 125 MCG tablet, 125 mcg daily. 08/31/2019 One day each week, will increase to 1.5 tabs., Disp: , Rfl:    metoprolol succinate (TOPROL-XL) 25 MG 24 hr tablet, Take 25 mg by mouth daily. (Patient not taking: Reported on 10/10/2021), Disp: , Rfl:    ondansetron (ZOFRAN-ODT) 8 MG disintegrating tablet, Take 8 mg by mouth every 8 (eight) hours as needed., Disp: , Rfl:    prednisoLONE acetate (PRED FORTE) 1 % ophthalmic suspension, Place 1 drop into both eyes daily., Disp: , Rfl:    sertraline (ZOLOFT) 50 MG tablet, Take 50 mg by mouth daily., Disp: , Rfl:    umeclidinium-vilanterol (ANORO ELLIPTA) 62.5-25 MCG/INH AEPB, Inhale into the lungs  daily. , Disp: , Rfl:    valsartan (DIOVAN) 320 MG tablet, Take by mouth daily. , Disp: , Rfl:   Allergies:  Allergies  Allergen Reactions   Iodinated Contrast Media Swelling   Sulfa Antibiotics Other (See Comments)    Drops Blood Pressure   Spironolactone Rash    Rash with itching unresponsive to benadryl.    Methotrexate Other (See Comments)   Doxycycline Nausea Only   Penicillins Rash    Patient tolerated cefazolin on 01/10/21   Quinine Derivatives Other (See Comments)    Patient doesn't remember reaction    Past Medical History, Surgical history, Social history, and Family History were reviewed and updated.  Review of Systems: Review of Systems  Constitutional:  Positive for malaise/fatigue.  HENT:   Positive for congestion.   Eyes: Negative.   Respiratory:  Positive for cough.   Cardiovascular:  Positive for palpitations.  Gastrointestinal:  Positive for diarrhea and nausea.  Genitourinary: Negative.   Musculoskeletal:  Positive for joint pain and myalgias.  Skin: Negative.   Neurological: Negative.   Endo/Heme/Allergies: Negative.   Psychiatric/Behavioral: Negative.       Physical Exam:  weight is 96 lb (43.5 kg). Her oral temperature is 97.6 F (36.4 C). Her blood pressure is 106/57 (abnormal) and her pulse is 83. Her respiration is 20 and oxygen saturation is 98%.   Physical Exam Vitals reviewed.  HENT:     Head: Normocephalic and atraumatic.  Eyes:     Pupils: Pupils are equal, round, and reactive to light.  Cardiovascular:     Rate and Rhythm: Normal rate and regular rhythm.     Heart sounds: Normal heart sounds.  Pulmonary:     Effort: Pulmonary effort is normal.     Breath sounds: Normal breath sounds.  Abdominal:     General: Bowel sounds are normal.     Palpations: Abdomen is soft.  Musculoskeletal:        General: No tenderness or deformity. Normal range of motion.     Cervical back: Normal range of motion.  Lymphadenopathy:     Cervical: No cervical adenopathy.  Skin:    General: Skin is warm and dry.     Findings: No erythema or rash.  Neurological:     Mental Status: She is alert and oriented to person, place, and time.  Psychiatric:        Behavior: Behavior normal.        Thought Content: Thought content normal.        Judgment: Judgment normal.     Lab Results  Component Value Date   WBC 7.3 04/06/2023   HGB 13.1 04/06/2023   HCT 40.7 04/06/2023   MCV 104.6 (H) 04/06/2023   PLT 307 04/06/2023     Chemistry      Component Value Date/Time   NA 140 04/06/2023 1140   NA 144 09/11/2017 1335   NA 138 07/31/2016 0947   K 4.7 04/06/2023 1140   K 4.2 09/11/2017 1335   K 4.3 07/31/2016 0947   CL 101 04/06/2023 1140   CL 104 09/11/2017  1335   CO2 28 04/06/2023 1140   CO2 28 09/11/2017 1335   CO2 27 07/31/2016 0947   BUN 22 04/06/2023 1140   BUN 17 09/11/2017 1335   BUN 14.7 07/31/2016 0947   CREATININE 0.94 04/06/2023 1140   CREATININE 0.9 09/11/2017 1335   CREATININE 0.7 07/31/2016 0947      Component Value Date/Time   CALCIUM 10.5 (  H) 04/06/2023 1140   CALCIUM 9.0 09/11/2017 1335   CALCIUM 9.2 07/31/2016 0947   ALKPHOS 87 04/06/2023 1140   ALKPHOS 102 (H) 09/11/2017 1335   ALKPHOS 70 07/31/2016 0947   AST 33 04/06/2023 1140   AST 18 07/31/2016 0947   ALT 20 04/06/2023 1140   ALT 20 09/11/2017 1335   ALT 9 07/31/2016 0947   BILITOT 0.7 04/06/2023 1140   BILITOT 0.62 07/31/2016 0947      Impression and Plan:  Kathleen Pace is 82 year old white female. She has a clear autoimmune issue. She has pernicious anemia. She has vitiligo. She has hypothyroidism.  She is on Plaquenil for the rheumatism.  I hate that she is losing the weight.  Again I am not sure why she is losing weight.  We we will see what her iron level is.  She has her own vitamin B12.  I will have to get her back probably in a couple months to see how she is doing.   Josph Macho, MD 6/17/202412:45 PM

## 2023-05-25 ENCOUNTER — Encounter: Payer: Self-pay | Admitting: Hematology & Oncology

## 2023-05-25 ENCOUNTER — Other Ambulatory Visit: Payer: Self-pay

## 2023-05-25 ENCOUNTER — Inpatient Hospital Stay: Payer: Medicare HMO | Attending: Hematology & Oncology

## 2023-05-25 ENCOUNTER — Inpatient Hospital Stay: Payer: Medicare HMO | Admitting: Hematology & Oncology

## 2023-05-25 VITALS — BP 103/55 | HR 85 | Temp 97.6°F | Resp 19 | Wt 96.0 lb

## 2023-05-25 DIAGNOSIS — K5900 Constipation, unspecified: Secondary | ICD-10-CM | POA: Insufficient documentation

## 2023-05-25 DIAGNOSIS — M790 Rheumatism, unspecified: Secondary | ICD-10-CM | POA: Diagnosis not present

## 2023-05-25 DIAGNOSIS — D693 Immune thrombocytopenic purpura: Secondary | ICD-10-CM | POA: Diagnosis present

## 2023-05-25 DIAGNOSIS — D510 Vitamin B12 deficiency anemia due to intrinsic factor deficiency: Secondary | ICD-10-CM | POA: Insufficient documentation

## 2023-05-25 DIAGNOSIS — M069 Rheumatoid arthritis, unspecified: Secondary | ICD-10-CM | POA: Insufficient documentation

## 2023-05-25 DIAGNOSIS — E039 Hypothyroidism, unspecified: Secondary | ICD-10-CM | POA: Diagnosis not present

## 2023-05-25 DIAGNOSIS — D500 Iron deficiency anemia secondary to blood loss (chronic): Secondary | ICD-10-CM

## 2023-05-25 DIAGNOSIS — Z7969 Long term (current) use of other immunomodulators and immunosuppressants: Secondary | ICD-10-CM | POA: Insufficient documentation

## 2023-05-25 DIAGNOSIS — Z7982 Long term (current) use of aspirin: Secondary | ICD-10-CM | POA: Diagnosis not present

## 2023-05-25 DIAGNOSIS — Z79890 Hormone replacement therapy (postmenopausal): Secondary | ICD-10-CM | POA: Diagnosis not present

## 2023-05-25 DIAGNOSIS — R197 Diarrhea, unspecified: Secondary | ICD-10-CM | POA: Insufficient documentation

## 2023-05-25 DIAGNOSIS — L80 Vitiligo: Secondary | ICD-10-CM | POA: Insufficient documentation

## 2023-05-25 DIAGNOSIS — D509 Iron deficiency anemia, unspecified: Secondary | ICD-10-CM | POA: Insufficient documentation

## 2023-05-25 DIAGNOSIS — Z79899 Other long term (current) drug therapy: Secondary | ICD-10-CM | POA: Insufficient documentation

## 2023-05-25 LAB — CBC WITH DIFFERENTIAL (CANCER CENTER ONLY)
Abs Immature Granulocytes: 0.02 10*3/uL (ref 0.00–0.07)
Basophils Absolute: 0.1 10*3/uL (ref 0.0–0.1)
Basophils Relative: 1 %
Eosinophils Absolute: 0.1 10*3/uL (ref 0.0–0.5)
Eosinophils Relative: 2 %
HCT: 40.9 % (ref 36.0–46.0)
Hemoglobin: 13 g/dL (ref 12.0–15.0)
Immature Granulocytes: 0 %
Lymphocytes Relative: 49 %
Lymphs Abs: 3.2 10*3/uL (ref 0.7–4.0)
MCH: 33.2 pg (ref 26.0–34.0)
MCHC: 31.8 g/dL (ref 30.0–36.0)
MCV: 104.3 fL — ABNORMAL HIGH (ref 80.0–100.0)
Monocytes Absolute: 0.9 10*3/uL (ref 0.1–1.0)
Monocytes Relative: 13 %
Neutro Abs: 2.3 10*3/uL (ref 1.7–7.7)
Neutrophils Relative %: 35 %
Platelet Count: 282 10*3/uL (ref 150–400)
RBC: 3.92 MIL/uL (ref 3.87–5.11)
RDW: 13.3 % (ref 11.5–15.5)
WBC Count: 6.6 10*3/uL (ref 4.0–10.5)
nRBC: 0 % (ref 0.0–0.2)

## 2023-05-25 LAB — CMP (CANCER CENTER ONLY)
ALT: 16 U/L (ref 0–44)
AST: 28 U/L (ref 15–41)
Albumin: 4 g/dL (ref 3.5–5.0)
Alkaline Phosphatase: 86 U/L (ref 38–126)
Anion gap: 9 (ref 5–15)
BUN: 20 mg/dL (ref 8–23)
CO2: 29 mmol/L (ref 22–32)
Calcium: 9.8 mg/dL (ref 8.9–10.3)
Chloride: 101 mmol/L (ref 98–111)
Creatinine: 1.09 mg/dL — ABNORMAL HIGH (ref 0.44–1.00)
GFR, Estimated: 51 mL/min — ABNORMAL LOW (ref >60)
Glucose, Bld: 130 mg/dL — ABNORMAL HIGH (ref 70–99)
Potassium: 5.1 mmol/L (ref 3.5–5.1)
Sodium: 139 mmol/L (ref 135–145)
Total Bilirubin: 0.7 mg/dL (ref 0.3–1.2)
Total Protein: 7 g/dL (ref 6.5–8.1)

## 2023-05-25 LAB — RETICULOCYTES
Immature Retic Fract: 4.4 % (ref 2.3–15.9)
RBC.: 3.87 MIL/uL (ref 3.87–5.11)
Retic Count, Absolute: 97.1 10*3/uL (ref 19.0–186.0)
Retic Ct Pct: 2.5 % (ref 0.4–3.1)

## 2023-05-25 LAB — FERRITIN: Ferritin: 35 ng/mL (ref 11–307)

## 2023-05-25 LAB — PREALBUMIN: Prealbumin: 20 mg/dL (ref 18–38)

## 2023-05-25 LAB — IRON AND IRON BINDING CAPACITY (CC-WL,HP ONLY)
Iron: 94 ug/dL (ref 28–170)
Saturation Ratios: 28 % (ref 10.4–31.8)
TIBC: 336 ug/dL (ref 250–450)
UIBC: 242 ug/dL (ref 148–442)

## 2023-05-25 LAB — VITAMIN B12: Vitamin B-12: 453 pg/mL (ref 180–914)

## 2023-05-25 MED ORDER — DRONABINOL 2.5 MG PO CAPS: 2.5000 mg | ORAL_CAPSULE | Freq: Two times a day (BID) | ORAL | 0 refills | Status: DC

## 2023-05-25 NOTE — Progress Notes (Signed)
Hematology and Oncology Follow Up Visit  Kathleen Pace [id number] 27-Apr-1941 82 y.o. 05/25/2023   Principle Diagnosis:  Chronic immune thrombocytopenia, remission. 2. Pernicious anemia. 3. Vitiligo. 4. Rheumatoid Arthritis 5. Iron Deficiency Anemia  Current Therapy:   Vitamin B12 1 mg IM Q. month  IV Iron as needed --Monoferric given on 01/08/2022      Interim History:  Ms.  Pace is back for follow-up.  Unfortunately, her weight is still the problem.  She has not gaining weight.  Her weight has been dropping.  Her weight today is holding steady.  We are going to have to try some to help her gain weight.  I think the best thing to try is Marinol.  We tried her on Megace elixir which she really cannot tolerate.  I talked to her about Marinol.  He will be 2.5 mg p.o. twice daily.  I think this would be reasonable.  She is not hurting.  She is having alternating constipation and diarrhea.  She says that she may have diarrhea once a week.  She is on Synthroid.  She says that her doctor checks her thyroid levels.  She has had no nausea or vomiting.  There has been no bleeding.  She has had no cough or shortness of breath.  Her iron studies show ferritin of 48 with an iron saturation of 25%.  So far, her heart seems doing pretty well.  I will see any issues with respect to the rheumatoid arthritis.  Currently, I would say that her performance status is probably ECOG 2.     Medications:  Current Outpatient Medications:    acetaminophen (TYLENOL) 650 MG CR tablet, Take by mouth., Disp: , Rfl:    albuterol (PROVENTIL HFA;VENTOLIN HFA) 108 (90 Base) MCG/ACT inhaler, Inhale 2 puffs into the lungs every 4 (four) hours as needed., Disp: , Rfl:    ALPRAZolam (XANAX) 0.25 MG tablet, Take 0.25 mg by mouth 2 (two) times daily as needed. (Patient not taking: Reported on 10/10/2021), Disp: , Rfl:    amLODipine (NORVASC) 10 MG tablet, Take 10 mg by mouth daily., Disp: , Rfl:    aspirin 81  MG EC tablet, Take 1 tablet by mouth daily., Disp: , Rfl:    atorvastatin (LIPITOR) 80 MG tablet, SMARTSIG:1 Tablet(s) By Mouth Every Evening, Disp: , Rfl:    CALCIUM-VITAMIN D PO, Take 1 capsule by mouth every morning. Calcium 600 mg  Vitamin D 200 (Patient not taking: Reported on 06/17/2021), Disp: , Rfl:    Cholecalciferol (VITAMIN D) 2000 UNITS tablet, Take 2,000 Units by mouth daily., Disp: , Rfl:    cyanocobalamin (VITAMIN B12) 1000 MCG/ML injection, INJECT 1 ML EVERY MONTH INTO YOUR MUSCLE, Disp: 3 mL, Rfl: 24   esomeprazole (NEXIUM) 20 MG capsule, Take 20 mg by mouth daily at 12 noon., Disp: , Rfl:    hydroxychloroquine (PLAQUENIL) 200 MG tablet, 200 mg daily. , Disp: , Rfl:    leflunomide (ARAVA) 10 MG tablet, Take 10 mg by mouth daily. Take 1 and 1/2 tablet total of 15 mg daily., Disp: , Rfl:    levothyroxine (SYNTHROID, LEVOTHROID) 125 MCG tablet, 125 mcg daily. 08/31/2019 One day each week, will increase to 1.5 tabs., Disp: , Rfl:    metoprolol succinate (TOPROL-XL) 25 MG 24 hr tablet, Take 25 mg by mouth daily. (Patient not taking: Reported on 10/10/2021), Disp: , Rfl:    ondansetron (ZOFRAN-ODT) 8 MG disintegrating tablet, Take 8 mg by mouth every 8 (eight) hours as  needed., Disp: , Rfl:    prednisoLONE acetate (PRED FORTE) 1 % ophthalmic suspension, Place 1 drop into both eyes daily., Disp: , Rfl:    sertraline (ZOLOFT) 50 MG tablet, Take 50 mg by mouth daily., Disp: , Rfl:    umeclidinium-vilanterol (ANORO ELLIPTA) 62.5-25 MCG/INH AEPB, Inhale into the lungs daily. , Disp: , Rfl:    valsartan (DIOVAN) 320 MG tablet, Take by mouth daily. , Disp: , Rfl:   Allergies:  Allergies  Allergen Reactions   Iodinated Contrast Media Swelling   Sulfa Antibiotics Other (See Comments)    Drops Blood Pressure   Spironolactone Rash    Rash with itching unresponsive to benadryl.    Methotrexate Other (See Comments)   Doxycycline Nausea Only   Penicillins Rash    Patient tolerated cefazolin on  01/10/21   Quinine Derivatives Other (See Comments)    Patient doesn't remember reaction    Past Medical History, Surgical history, Social history, and Family History were reviewed and updated.  Review of Systems: Review of Systems  Constitutional:  Positive for malaise/fatigue.  HENT:  Positive for congestion.   Eyes: Negative.   Respiratory:  Positive for cough.   Cardiovascular:  Positive for palpitations.  Gastrointestinal:  Positive for diarrhea and nausea.  Genitourinary: Negative.   Musculoskeletal:  Positive for joint pain and myalgias.  Skin: Negative.   Neurological: Negative.   Endo/Heme/Allergies: Negative.   Psychiatric/Behavioral: Negative.       Physical Exam:  weight is 96 lb (43.5 kg). Her oral temperature is 97.6 F (36.4 C). Her blood pressure is 103/55 (abnormal) and her pulse is 85. Her respiration is 19 and oxygen saturation is 95%.   Physical Exam Vitals reviewed.  HENT:     Head: Normocephalic and atraumatic.  Eyes:     Pupils: Pupils are equal, round, and reactive to light.  Cardiovascular:     Rate and Rhythm: Normal rate and regular rhythm.     Heart sounds: Normal heart sounds.  Pulmonary:     Effort: Pulmonary effort is normal.     Breath sounds: Normal breath sounds.  Abdominal:     General: Bowel sounds are normal.     Palpations: Abdomen is soft.  Musculoskeletal:        General: No tenderness or deformity. Normal range of motion.     Cervical back: Normal range of motion.  Lymphadenopathy:     Cervical: No cervical adenopathy.  Skin:    General: Skin is warm and dry.     Findings: No erythema or rash.  Neurological:     Mental Status: She is alert and oriented to person, place, and time.  Psychiatric:        Behavior: Behavior normal.        Thought Content: Thought content normal.        Judgment: Judgment normal.      Lab Results  Component Value Date   WBC 6.6 05/25/2023   HGB 13.0 05/25/2023   HCT 40.9 05/25/2023    MCV 104.3 (H) 05/25/2023   PLT 282 05/25/2023     Chemistry      Component Value Date/Time   NA 139 05/25/2023 1144   NA 144 09/11/2017 1335   NA 138 07/31/2016 0947   K 5.1 05/25/2023 1144   K 4.2 09/11/2017 1335   K 4.3 07/31/2016 0947   CL 101 05/25/2023 1144   CL 104 09/11/2017 1335   CO2 29 05/25/2023 1144   CO2 28  09/11/2017 1335   CO2 27 07/31/2016 0947   BUN 20 05/25/2023 1144   BUN 17 09/11/2017 1335   BUN 14.7 07/31/2016 0947   CREATININE 1.09 (H) 05/25/2023 1144   CREATININE 0.9 09/11/2017 1335   CREATININE 0.7 07/31/2016 0947      Component Value Date/Time   CALCIUM 9.8 05/25/2023 1144   CALCIUM 9.0 09/11/2017 1335   CALCIUM 9.2 07/31/2016 0947   ALKPHOS 86 05/25/2023 1144   ALKPHOS 102 (H) 09/11/2017 1335   ALKPHOS 70 07/31/2016 0947   AST 28 05/25/2023 1144   AST 18 07/31/2016 0947   ALT 16 05/25/2023 1144   ALT 20 09/11/2017 1335   ALT 9 07/31/2016 0947   BILITOT 0.7 05/25/2023 1144   BILITOT 0.62 07/31/2016 0947      Impression and Plan:  Ms. Lamberg is 82 year old white female. She has a clear autoimmune issue. She has pernicious anemia. She has vitiligo. She has hypothyroidism.  She is on Plaquenil for the rheumatism.  I really hope that the Marinol will help a little bit.  Will plan to get her back in a month or so and see how she is doing.   Josph Macho, MD 8/5/202412:36 PM

## 2023-05-26 ENCOUNTER — Telehealth: Payer: Self-pay

## 2023-05-26 NOTE — Telephone Encounter (Signed)
LMTCB for lab results. Unable to leave detailed VM per DPR.

## 2023-05-26 NOTE — Telephone Encounter (Signed)
-----   Message from Josph Macho sent at 05/25/2023  5:59 PM EDT ----- Call and let him know that the iron studies and vitamin B12 are all normal.  Thanks.  Cindee Lame

## 2023-05-26 NOTE — Telephone Encounter (Signed)
Called and informed patient of lab results via VM.

## 2023-06-02 ENCOUNTER — Encounter: Payer: Self-pay | Admitting: *Deleted

## 2023-06-02 NOTE — Progress Notes (Signed)
[employer] denied Dronabinol capsules, in addition to appeal.  Dr. Myna Hidalgo made aware of decision.

## 2023-07-02 ENCOUNTER — Other Ambulatory Visit: Payer: Self-pay

## 2023-07-02 ENCOUNTER — Inpatient Hospital Stay: Payer: Medicare HMO | Attending: Hematology & Oncology

## 2023-07-02 ENCOUNTER — Inpatient Hospital Stay: Payer: Medicare HMO | Admitting: Hematology & Oncology

## 2023-07-02 VITALS — BP 153/61 | HR 86 | Temp 97.8°F | Resp 20 | Wt 99.0 lb

## 2023-07-02 DIAGNOSIS — Z7982 Long term (current) use of aspirin: Secondary | ICD-10-CM | POA: Insufficient documentation

## 2023-07-02 DIAGNOSIS — D500 Iron deficiency anemia secondary to blood loss (chronic): Secondary | ICD-10-CM | POA: Diagnosis not present

## 2023-07-02 DIAGNOSIS — D693 Immune thrombocytopenic purpura: Secondary | ICD-10-CM | POA: Insufficient documentation

## 2023-07-02 DIAGNOSIS — Z7969 Long term (current) use of other immunomodulators and immunosuppressants: Secondary | ICD-10-CM | POA: Insufficient documentation

## 2023-07-02 DIAGNOSIS — Z79899 Other long term (current) drug therapy: Secondary | ICD-10-CM | POA: Diagnosis not present

## 2023-07-02 DIAGNOSIS — E039 Hypothyroidism, unspecified: Secondary | ICD-10-CM | POA: Insufficient documentation

## 2023-07-02 DIAGNOSIS — D510 Vitamin B12 deficiency anemia due to intrinsic factor deficiency: Secondary | ICD-10-CM | POA: Insufficient documentation

## 2023-07-02 DIAGNOSIS — D509 Iron deficiency anemia, unspecified: Secondary | ICD-10-CM | POA: Insufficient documentation

## 2023-07-02 DIAGNOSIS — L80 Vitiligo: Secondary | ICD-10-CM | POA: Diagnosis not present

## 2023-07-02 DIAGNOSIS — Z7952 Long term (current) use of systemic steroids: Secondary | ICD-10-CM | POA: Diagnosis not present

## 2023-07-02 DIAGNOSIS — M069 Rheumatoid arthritis, unspecified: Secondary | ICD-10-CM | POA: Insufficient documentation

## 2023-07-02 LAB — CMP (CANCER CENTER ONLY)
ALT: 16 U/L (ref 0–44)
AST: 26 U/L (ref 15–41)
Albumin: 3.9 g/dL (ref 3.5–5.0)
Alkaline Phosphatase: 95 U/L (ref 38–126)
Anion gap: 8 (ref 5–15)
BUN: 21 mg/dL (ref 8–23)
CO2: 29 mmol/L (ref 22–32)
Calcium: 9.3 mg/dL (ref 8.9–10.3)
Chloride: 102 mmol/L (ref 98–111)
Creatinine: 0.73 mg/dL (ref 0.44–1.00)
GFR, Estimated: >60 mL/min (ref >60)
Glucose, Bld: 109 mg/dL — ABNORMAL HIGH (ref 70–99)
Potassium: 4 mmol/L (ref 3.5–5.1)
Sodium: 139 mmol/L (ref 135–145)
Total Bilirubin: 0.5 mg/dL (ref 0.3–1.2)
Total Protein: 6.6 g/dL (ref 6.5–8.1)

## 2023-07-02 LAB — CBC WITH DIFFERENTIAL (CANCER CENTER ONLY)
Abs Immature Granulocytes: 0.01 10*3/uL (ref 0.00–0.07)
Basophils Absolute: 0 10*3/uL (ref 0.0–0.1)
Basophils Relative: 1 %
Eosinophils Absolute: 0.1 10*3/uL (ref 0.0–0.5)
Eosinophils Relative: 1 %
HCT: 37.8 % (ref 36.0–46.0)
Hemoglobin: 12.4 g/dL (ref 12.0–15.0)
Immature Granulocytes: 0 %
Lymphocytes Relative: 36 %
Lymphs Abs: 2.8 10*3/uL (ref 0.7–4.0)
MCH: 34 pg (ref 26.0–34.0)
MCHC: 32.8 g/dL (ref 30.0–36.0)
MCV: 103.6 fL — ABNORMAL HIGH (ref 80.0–100.0)
Monocytes Absolute: 1.2 10*3/uL — ABNORMAL HIGH (ref 0.1–1.0)
Monocytes Relative: 15 %
Neutro Abs: 3.6 10*3/uL (ref 1.7–7.7)
Neutrophils Relative %: 47 %
Platelet Count: 291 10*3/uL (ref 150–400)
RBC: 3.65 MIL/uL — ABNORMAL LOW (ref 3.87–5.11)
RDW: 13.3 % (ref 11.5–15.5)
WBC Count: 7.7 10*3/uL (ref 4.0–10.5)
nRBC: 0 % (ref 0.0–0.2)

## 2023-07-02 LAB — IRON AND IRON BINDING CAPACITY (CC-WL,HP ONLY)
Iron: 82 ug/dL (ref 28–170)
Saturation Ratios: 25 % (ref 10.4–31.8)
TIBC: 330 ug/dL (ref 250–450)
UIBC: 248 ug/dL (ref 148–442)

## 2023-07-02 LAB — FERRITIN: Ferritin: 50 ng/mL (ref 11–307)

## 2023-07-02 LAB — PREALBUMIN: Prealbumin: 21 mg/dL (ref 18–38)

## 2023-07-02 NOTE — Progress Notes (Signed)
Hematology and Oncology Follow Up Visit  CAMREN WAHLERT [id number] 10-May-1941 82 y.o. 07/02/2023   Principle Diagnosis:  Chronic immune thrombocytopenia, remission. 2. Pernicious anemia. 3. Vitiligo. 4. Rheumatoid Arthritis 5. Iron Deficiency Anemia  Current Therapy:   Vitamin B12 1 mg IM Q. month  IV Iron as needed --Monoferric given on 01/08/2022      Interim History:  Ms.  Neal is back for follow-up.  She apparently fell out of bed a couple days ago.  She was down in [facility].  Thankfully, she did not break anything.  Currently, she is on a Medrol Dosepak.  This is helping her a little bit.  Her weight is up a little bit.  We cannot give her the Marinol.  Apparently her insurance would not cover this.  She has had no problems with bowels or bladder.  There is been no problems with diarrhea.  Thankfully, her weight is up a little bit.  She has had no bleeding.  There has been no palpitations.  She has had no cardiac issues.  Her last iron studies back in August showed a ferritin of 35 with an iron saturation of 28%.  Currently, I would say that her performance status is probably ECOG 1.   Medications:  Current Outpatient Medications:    predniSONE (STERAPRED UNI-PAK 21 TAB) 10 MG (21) TBPK tablet, Take 10 mg by mouth daily., Disp: , Rfl:    acetaminophen (TYLENOL) 650 MG CR tablet, Take by mouth., Disp: , Rfl:    albuterol (PROVENTIL HFA;VENTOLIN HFA) 108 (90 Base) MCG/ACT inhaler, Inhale 2 puffs into the lungs every 4 (four) hours as needed., Disp: , Rfl:    ALPRAZolam (XANAX) 0.25 MG tablet, Take 0.25 mg by mouth 2 (two) times daily as needed. (Patient not taking: Reported on 10/10/2021), Disp: , Rfl:    amLODipine (NORVASC) 10 MG tablet, Take 10 mg by mouth daily., Disp: , Rfl:    aspirin 81 MG EC tablet, Take 1 tablet by mouth daily., Disp: , Rfl:    atorvastatin (LIPITOR) 80 MG tablet, SMARTSIG:1 Tablet(s) By Mouth Every Evening, Disp: , Rfl:     CALCIUM-VITAMIN D PO, Take 1 capsule by mouth every morning. Calcium 600 mg  Vitamin D 200 (Patient not taking: Reported on 06/17/2021), Disp: , Rfl:    Cholecalciferol (VITAMIN D) 2000 UNITS tablet, Take 2,000 Units by mouth daily., Disp: , Rfl:    cyanocobalamin (VITAMIN B12) 1000 MCG/ML injection, INJECT 1 ML EVERY MONTH INTO YOUR MUSCLE, Disp: 3 mL, Rfl: 24   dronabinol (MARINOL) 2.5 MG capsule, Take 1 capsule (2.5 mg total) by mouth 2 (two) times daily before a meal., Disp: 60 capsule, Rfl: 0   esomeprazole (NEXIUM) 20 MG capsule, Take 20 mg by mouth daily at 12 noon., Disp: , Rfl:    hydroxychloroquine (PLAQUENIL) 200 MG tablet, 200 mg daily. , Disp: , Rfl:    leflunomide (ARAVA) 10 MG tablet, Take 10 mg by mouth daily. Take 1 and 1/2 tablet total of 15 mg daily., Disp: , Rfl:    levothyroxine (SYNTHROID, LEVOTHROID) 125 MCG tablet, 125 mcg daily. 08/31/2019 One day each week, will increase to 1.5 tabs., Disp: , Rfl:    metoprolol succinate (TOPROL-XL) 25 MG 24 hr tablet, Take 25 mg by mouth daily. (Patient not taking: Reported on 10/10/2021), Disp: , Rfl:    ondansetron (ZOFRAN-ODT) 8 MG disintegrating tablet, Take 8 mg by mouth every 8 (eight) hours as needed., Disp: , Rfl:    prednisoLONE  acetate (PRED FORTE) 1 % ophthalmic suspension, Place 1 drop into both eyes daily., Disp: , Rfl:    sertraline (ZOLOFT) 50 MG tablet, Take 50 mg by mouth daily., Disp: , Rfl:    umeclidinium-vilanterol (ANORO ELLIPTA) 62.5-25 MCG/INH AEPB, Inhale into the lungs daily. , Disp: , Rfl:    valsartan (DIOVAN) 320 MG tablet, Take by mouth daily. , Disp: , Rfl:   Allergies:  Allergies  Allergen Reactions   Iodinated Contrast Media Swelling   Sulfa Antibiotics Other (See Comments)    Drops Blood Pressure   Spironolactone Rash    Rash with itching unresponsive to benadryl.    Methotrexate Other (See Comments)   Doxycycline Nausea Only   Penicillins Rash    Patient tolerated cefazolin on 01/10/21   Quinine  Derivatives Other (See Comments)    Patient doesn't remember reaction    Past Medical History, Surgical history, Social history, and Family History were reviewed and updated.  Review of Systems: Review of Systems  Constitutional:  Positive for malaise/fatigue.  HENT:  Positive for congestion.   Eyes: Negative.   Respiratory:  Positive for cough.   Cardiovascular:  Positive for palpitations.  Gastrointestinal:  Positive for diarrhea and nausea.  Genitourinary: Negative.   Musculoskeletal:  Positive for joint pain and myalgias.  Skin: Negative.   Neurological: Negative.   Endo/Heme/Allergies: Negative.   Psychiatric/Behavioral: Negative.       Physical Exam:  weight is 99 lb (44.9 kg). Her oral temperature is 97.8 F (36.6 C). Her blood pressure is 153/61 (abnormal) and her pulse is 86. Her respiration is 20 and oxygen saturation is 94%.   Physical Exam Vitals reviewed.  HENT:     Head: Normocephalic and atraumatic.  Eyes:     Pupils: Pupils are equal, round, and reactive to light.  Cardiovascular:     Rate and Rhythm: Normal rate and regular rhythm.     Heart sounds: Normal heart sounds.  Pulmonary:     Effort: Pulmonary effort is normal.     Breath sounds: Normal breath sounds.  Abdominal:     General: Bowel sounds are normal.     Palpations: Abdomen is soft.  Musculoskeletal:        General: No tenderness or deformity. Normal range of motion.     Cervical back: Normal range of motion.  Lymphadenopathy:     Cervical: No cervical adenopathy.  Skin:    General: Skin is warm and dry.     Findings: No erythema or rash.  Neurological:     Mental Status: She is alert and oriented to person, place, and time.  Psychiatric:        Behavior: Behavior normal.        Thought Content: Thought content normal.        Judgment: Judgment normal.      Lab Results  Component Value Date   WBC 7.7 07/02/2023   HGB 12.4 07/02/2023   HCT 37.8 07/02/2023   MCV 103.6 (H)  07/02/2023   PLT 291 07/02/2023     Chemistry      Component Value Date/Time   NA 139 07/02/2023 1133   NA 144 09/11/2017 1335   NA 138 07/31/2016 0947   K 4.0 07/02/2023 1133   K 4.2 09/11/2017 1335   K 4.3 07/31/2016 0947   CL 102 07/02/2023 1133   CL 104 09/11/2017 1335   CO2 29 07/02/2023 1133   CO2 28 09/11/2017 1335   CO2 27 07/31/2016 0947  BUN 21 07/02/2023 1133   BUN 17 09/11/2017 1335   BUN 14.7 07/31/2016 0947   CREATININE 0.73 07/02/2023 1133   CREATININE 0.9 09/11/2017 1335   CREATININE 0.7 07/31/2016 0947      Component Value Date/Time   CALCIUM 9.3 07/02/2023 1133   CALCIUM 9.0 09/11/2017 1335   CALCIUM 9.2 07/31/2016 0947   ALKPHOS 95 07/02/2023 1133   ALKPHOS 102 (H) 09/11/2017 1335   ALKPHOS 70 07/31/2016 0947   AST 26 07/02/2023 1133   AST 18 07/31/2016 0947   ALT 16 07/02/2023 1133   ALT 20 09/11/2017 1335   ALT 9 07/31/2016 0947   BILITOT 0.5 07/02/2023 1133   BILITOT 0.62 07/31/2016 0947      Impression and Plan:  Ms. Trow is 82 year old white female. She has a clear autoimmune issue. She has pernicious anemia. She has vitiligo. She has hypothyroidism.  She is on Plaquenil for the rheumatism.  At least, her weight is up a little bit.  I am happy about this.  Sound like she may go try some CBD oil.  I know she has family who is try to help her out with this.  I think we can probably get her back now in 2 months.  I would like to get her back right around the Holiday season so that we can make sure her blood is okay.   Josph Macho, MD 9/12/202412:20 PM

## 2023-07-03 ENCOUNTER — Telehealth: Payer: Self-pay

## 2023-07-03 NOTE — Telephone Encounter (Signed)
-----   Message from Josph Macho sent at 07/02/2023  4:50 PM EDT ----- Please call let her know that the iron studies look okay.  Thanks.  Cindee Lame

## 2023-07-03 NOTE — Telephone Encounter (Signed)
Called patient to inform her that her iron studies are ok per Dr. Myna Hidalgo. She expressed gratitude for the information.

## 2023-09-10 ENCOUNTER — Inpatient Hospital Stay: Payer: Medicare HMO

## 2023-09-10 ENCOUNTER — Inpatient Hospital Stay: Payer: Medicare HMO | Admitting: Hematology & Oncology

## 2023-09-21 ENCOUNTER — Inpatient Hospital Stay: Payer: Medicare HMO | Admitting: Medical Oncology

## 2023-09-21 ENCOUNTER — Inpatient Hospital Stay: Payer: Medicare HMO

## 2023-10-12 ENCOUNTER — Encounter: Payer: Self-pay | Admitting: Medical Oncology

## 2023-10-12 ENCOUNTER — Inpatient Hospital Stay: Payer: Medicare HMO | Admitting: Medical Oncology

## 2023-10-12 ENCOUNTER — Inpatient Hospital Stay: Payer: Medicare HMO | Attending: Hematology & Oncology

## 2023-10-12 VITALS — BP 160/60 | HR 65 | Temp 98.2°F | Resp 20 | Ht 62.99 in | Wt 96.0 lb

## 2023-10-12 DIAGNOSIS — Z7901 Long term (current) use of anticoagulants: Secondary | ICD-10-CM | POA: Diagnosis not present

## 2023-10-12 DIAGNOSIS — K5900 Constipation, unspecified: Secondary | ICD-10-CM | POA: Insufficient documentation

## 2023-10-12 DIAGNOSIS — E039 Hypothyroidism, unspecified: Secondary | ICD-10-CM | POA: Insufficient documentation

## 2023-10-12 DIAGNOSIS — D510 Vitamin B12 deficiency anemia due to intrinsic factor deficiency: Secondary | ICD-10-CM | POA: Insufficient documentation

## 2023-10-12 DIAGNOSIS — Z7982 Long term (current) use of aspirin: Secondary | ICD-10-CM | POA: Insufficient documentation

## 2023-10-12 DIAGNOSIS — L80 Vitiligo: Secondary | ICD-10-CM | POA: Insufficient documentation

## 2023-10-12 DIAGNOSIS — Z79899 Other long term (current) drug therapy: Secondary | ICD-10-CM | POA: Insufficient documentation

## 2023-10-12 DIAGNOSIS — D508 Other iron deficiency anemias: Secondary | ICD-10-CM

## 2023-10-12 DIAGNOSIS — D500 Iron deficiency anemia secondary to blood loss (chronic): Secondary | ICD-10-CM

## 2023-10-12 DIAGNOSIS — M069 Rheumatoid arthritis, unspecified: Secondary | ICD-10-CM | POA: Insufficient documentation

## 2023-10-12 DIAGNOSIS — Z79890 Hormone replacement therapy (postmenopausal): Secondary | ICD-10-CM | POA: Diagnosis not present

## 2023-10-12 DIAGNOSIS — D693 Immune thrombocytopenic purpura: Secondary | ICD-10-CM | POA: Insufficient documentation

## 2023-10-12 LAB — CBC WITH DIFFERENTIAL (CANCER CENTER ONLY)
Abs Immature Granulocytes: 0.02 10*3/uL (ref 0.00–0.07)
Basophils Absolute: 0.1 10*3/uL (ref 0.0–0.1)
Basophils Relative: 1 %
Eosinophils Absolute: 0.1 10*3/uL (ref 0.0–0.5)
Eosinophils Relative: 1 %
HCT: 33.7 % — ABNORMAL LOW (ref 36.0–46.0)
Hemoglobin: 11.1 g/dL — ABNORMAL LOW (ref 12.0–15.0)
Immature Granulocytes: 0 %
Lymphocytes Relative: 37 %
Lymphs Abs: 2.8 10*3/uL (ref 0.7–4.0)
MCH: 35 pg — ABNORMAL HIGH (ref 26.0–34.0)
MCHC: 32.9 g/dL (ref 30.0–36.0)
MCV: 106.3 fL — ABNORMAL HIGH (ref 80.0–100.0)
Monocytes Absolute: 1.1 10*3/uL — ABNORMAL HIGH (ref 0.1–1.0)
Monocytes Relative: 15 %
Neutro Abs: 3.4 10*3/uL (ref 1.7–7.7)
Neutrophils Relative %: 46 %
Platelet Count: 183 10*3/uL (ref 150–400)
RBC: 3.17 MIL/uL — ABNORMAL LOW (ref 3.87–5.11)
RDW: 14.1 % (ref 11.5–15.5)
WBC Count: 7.5 10*3/uL (ref 4.0–10.5)
nRBC: 0 % (ref 0.0–0.2)

## 2023-10-12 LAB — CMP (CANCER CENTER ONLY)
ALT: 15 U/L (ref 0–44)
AST: 29 U/L (ref 15–41)
Albumin: 3.9 g/dL (ref 3.5–5.0)
Alkaline Phosphatase: 90 U/L (ref 38–126)
Anion gap: 7 (ref 5–15)
BUN: 15 mg/dL (ref 8–23)
CO2: 31 mmol/L (ref 22–32)
Calcium: 9.2 mg/dL (ref 8.9–10.3)
Chloride: 103 mmol/L (ref 98–111)
Creatinine: 0.76 mg/dL (ref 0.44–1.00)
GFR, Estimated: >60 mL/min (ref >60)
Glucose, Bld: 109 mg/dL — ABNORMAL HIGH (ref 70–99)
Potassium: 4.4 mmol/L (ref 3.5–5.1)
Sodium: 141 mmol/L (ref 135–145)
Total Bilirubin: 0.7 mg/dL (ref <1.2)
Total Protein: 6.2 g/dL — ABNORMAL LOW (ref 6.5–8.1)

## 2023-10-12 LAB — FERRITIN: Ferritin: 57 ng/mL (ref 11–307)

## 2023-10-12 LAB — RETICULOCYTES
Immature Retic Fract: 5.3 % (ref 2.3–15.9)
RBC.: 3.15 MIL/uL — ABNORMAL LOW (ref 3.87–5.11)
Retic Count, Absolute: 84.7 10*3/uL (ref 19.0–186.0)
Retic Ct Pct: 2.7 % (ref 0.4–3.1)

## 2023-10-12 NOTE — Progress Notes (Signed)
Hematology and Oncology Follow Up Visit  Kathleen Pace [id number] 04-Apr-1941 82 y.o. 10/12/2023   Principle Diagnosis:  Chronic immune thrombocytopenia, remission. 2. Pernicious anemia. 3. Vitiligo. 4. Rheumatoid Arthritis 5. Iron Deficiency Anemia  Current Therapy:   Vitamin B12 1 mg IM Q. month  IV Iron as needed --Monoferric given on 01/08/2022      Interim History:  Ms.  Kathleen Pace is back for follow-up.   Today she states that she is doing ok.   She has had no problems with bowels or bladder.  There is been no problems with diarrhea.  She was started on Eliquis about a month ago for a blood clot in her liver. She is tolerating it well aside from some nosebleeds. She is getting these about every other day.  Recently her oxygen company came out and added humidity to her oxygen which has helped reduce the nosebleeds. She has also seen a few drop or so of bright red blood rarely when she has a BM when constipated. No abdominal pain. No known hemorrhoids.  She does report missing her B12 last month. She injects this at home by herself.   There has been no palpitations.  She has had no cardiac issues.  Her last iron studies back in August showed a ferritin of 35 with an iron saturation of 28%.  Her weight is down a bit. Appetite is down due to grief- she recently lost one of her sons unexpectantly. Insurance would not cover Marinol.   Currently, I would say that her performance status is probably ECOG 1. Wt Readings from Last 3 Encounters:  10/12/23 96 lb (43.5 kg)  07/02/23 99 lb (44.9 kg)  05/25/23 96 lb (43.5 kg)   Medications:  Current Outpatient Medications:    acetaminophen (TYLENOL) 650 MG CR tablet, Take by mouth., Disp: , Rfl:    albuterol (PROVENTIL HFA;VENTOLIN HFA) 108 (90 Base) MCG/ACT inhaler, Inhale 2 puffs into the lungs every 4 (four) hours as needed., Disp: , Rfl:    ALPRAZolam (XANAX) 0.25 MG tablet, Take 0.25 mg by mouth 2 (two) times daily as needed.,  Disp: , Rfl:    amLODipine (NORVASC) 10 MG tablet, Take 10 mg by mouth daily., Disp: , Rfl:    apixaban (ELIQUIS) 5 MG TABS tablet, Take 5 mg by mouth 2 (two) times daily., Disp: , Rfl:    aspirin 81 MG EC tablet, Take 1 tablet by mouth daily., Disp: , Rfl:    atorvastatin (LIPITOR) 80 MG tablet, SMARTSIG:1 Tablet(s) By Mouth Every Evening, Disp: , Rfl:    CALCIUM-VITAMIN D PO, Take 1 capsule by mouth every morning. Calcium 600 mg  Vitamin D 200, Disp: , Rfl:    Cholecalciferol (VITAMIN D) 2000 UNITS tablet, Take 2,000 Units by mouth daily., Disp: , Rfl:    cyanocobalamin (VITAMIN B12) 1000 MCG/ML injection, INJECT 1 ML EVERY MONTH INTO YOUR MUSCLE, Disp: 3 mL, Rfl: 24   dronabinol (MARINOL) 2.5 MG capsule, Take 1 capsule (2.5 mg total) by mouth 2 (two) times daily before a meal., Disp: 60 capsule, Rfl: 0   esomeprazole (NEXIUM) 20 MG capsule, Take 20 mg by mouth daily at 12 noon., Disp: , Rfl:    hydroxychloroquine (PLAQUENIL) 200 MG tablet, 200 mg daily. , Disp: , Rfl:    leflunomide (ARAVA) 10 MG tablet, Take 10 mg by mouth daily. Take 1 and 1/2 tablet total of 15 mg daily., Disp: , Rfl:    levothyroxine (SYNTHROID, LEVOTHROID) 125 MCG tablet, 125 mcg  daily. 08/31/2019 One day each week, will increase to 1.5 tabs., Disp: , Rfl:    metoprolol succinate (TOPROL-XL) 25 MG 24 hr tablet, Take 25 mg by mouth daily., Disp: , Rfl:    ondansetron (ZOFRAN-ODT) 8 MG disintegrating tablet, Take 8 mg by mouth every 8 (eight) hours as needed., Disp: , Rfl:    prednisoLONE acetate (PRED FORTE) 1 % ophthalmic suspension, Place 1 drop into both eyes daily., Disp: , Rfl:    sertraline (ZOLOFT) 50 MG tablet, Take 50 mg by mouth daily., Disp: , Rfl:    umeclidinium-vilanterol (ANORO ELLIPTA) 62.5-25 MCG/INH AEPB, Inhale into the lungs daily. , Disp: , Rfl:    valsartan (DIOVAN) 320 MG tablet, Take by mouth daily. , Disp: , Rfl:    naloxone (NARCAN) nasal spray 4 mg/0.1 mL, Place 1 spray into the nose once.  (Patient not taking: Reported on 10/12/2023), Disp: , Rfl:   Allergies:  Allergies  Allergen Reactions   Iodinated Contrast Media Swelling   Sulfa Antibiotics Other (See Comments)    Drops Blood Pressure   Methotrexate Nausea Only and Other (See Comments)    Nose bleeds   Spironolactone Rash    Rash with itching unresponsive to benadryl.    Doxycycline Nausea Only   Penicillins Rash    Patient tolerated cefazolin on 01/10/21   Quinine Derivatives Other (See Comments)    Patient doesn't remember reaction    Past Medical History, Surgical history, Social history, and Family History were reviewed and updated.  Review of Systems: Review of Systems  Constitutional:  Positive for malaise/fatigue.  HENT:  Positive for congestion.   Eyes: Negative.   Respiratory:  Positive for cough.   Cardiovascular:  Positive for palpitations.  Gastrointestinal:  Positive for diarrhea and nausea.  Genitourinary: Negative.   Musculoskeletal:  Positive for joint pain and myalgias.  Skin: Negative.   Neurological: Negative.   Endo/Heme/Allergies: Negative.   Psychiatric/Behavioral: Negative.       Physical Exam:  height is 5' 2.99" (1.6 m) and weight is 96 lb (43.5 kg). Her oral temperature is 98.2 F (36.8 C). Her blood pressure is 160/60 (abnormal) and her pulse is 65. Her respiration is 20 and oxygen saturation is 100%.   Physical Exam Vitals reviewed.  Constitutional:      Comments: Thin, Frail. Elderly   HENT:     Head: Normocephalic and atraumatic.  Eyes:     Pupils: Pupils are equal, round, and reactive to light.  Cardiovascular:     Rate and Rhythm: Normal rate and regular rhythm.     Heart sounds: Normal heart sounds.  Pulmonary:     Effort: Pulmonary effort is normal.     Breath sounds: Normal breath sounds.  Abdominal:     General: Bowel sounds are normal.     Palpations: Abdomen is soft.  Musculoskeletal:        General: No tenderness or deformity. Normal range of motion.      Cervical back: Normal range of motion.  Lymphadenopathy:     Cervical: No cervical adenopathy.  Skin:    General: Skin is warm and dry.     Findings: No erythema or rash.  Neurological:     Mental Status: She is alert and oriented to person, place, and time.  Psychiatric:        Behavior: Behavior normal.        Thought Content: Thought content normal.        Judgment: Judgment normal.  Lab Results  Component Value Date   WBC 7.5 10/12/2023   HGB 11.1 (L) 10/12/2023   HCT 33.7 (L) 10/12/2023   MCV 106.3 (H) 10/12/2023   PLT 183 10/12/2023     Chemistry      Component Value Date/Time   NA 141 10/12/2023 1446   NA 144 09/11/2017 1335   NA 138 07/31/2016 0947   K 4.4 10/12/2023 1446   K 4.2 09/11/2017 1335   K 4.3 07/31/2016 0947   CL 103 10/12/2023 1446   CL 104 09/11/2017 1335   CO2 31 10/12/2023 1446   CO2 28 09/11/2017 1335   CO2 27 07/31/2016 0947   BUN 15 10/12/2023 1446   BUN 17 09/11/2017 1335   BUN 14.7 07/31/2016 0947   CREATININE 0.76 10/12/2023 1446   CREATININE 0.9 09/11/2017 1335   CREATININE 0.7 07/31/2016 0947      Component Value Date/Time   CALCIUM 9.2 10/12/2023 1446   CALCIUM 9.0 09/11/2017 1335   CALCIUM 9.2 07/31/2016 0947   ALKPHOS 90 10/12/2023 1446   ALKPHOS 102 (H) 09/11/2017 1335   ALKPHOS 70 07/31/2016 0947   AST 29 10/12/2023 1446   AST 18 07/31/2016 0947   ALT 15 10/12/2023 1446   ALT 20 09/11/2017 1335   ALT 9 07/31/2016 0947   BILITOT 0.7 10/12/2023 1446   BILITOT 0.62 07/31/2016 0947     Encounter Diagnoses  Name Primary?   Iron deficiency anemia due to chronic blood loss    Idiopathic thrombocytopenic purpura (HCC) Yes   Pernicious anemia    Other iron deficiency anemia     Impression and Plan:  Ms. Beerman is 82 year old white female. She has a clear autoimmune issue. She has pernicious anemia. She has vitiligo. She has hypothyroidism.  She is on Plaquenil for the rheumatism.  She has had a tough month.  I am very sorry to hear about her son. Her daughter who accompanies her here today states that she is slowly getting better with time. I am hopeful that her weight will recover as her grief improves.   In terms of her ITP this is well controlled today with a normal platelet count.   Her anemia is a bit worsened due to her missing her B12 injection and likely from her nosebleeds. I have suggested a nasal saline gel to help prevent nose bleeds as well. Reviewed red flags.   Iron studied pending- likely low- will replace if needed.   RTC 1 month APP, labs (CBC, CMP, B12, iron, ferritin)   Rushie Chestnut, PA-C 12/23/20243:27 PM

## 2023-10-13 LAB — IRON AND IRON BINDING CAPACITY (CC-WL,HP ONLY)
Iron: 99 ug/dL (ref 28–170)
Saturation Ratios: 34 % — ABNORMAL HIGH (ref 10.4–31.8)
TIBC: 291 ug/dL (ref 250–450)
UIBC: 192 ug/dL (ref 148–442)

## 2023-11-05 ENCOUNTER — Encounter: Payer: Self-pay | Admitting: Hematology & Oncology

## 2023-11-12 ENCOUNTER — Other Ambulatory Visit: Payer: Self-pay | Admitting: Medical Oncology

## 2023-11-12 ENCOUNTER — Inpatient Hospital Stay: Payer: Medicare Other | Attending: Medical Oncology

## 2023-11-12 ENCOUNTER — Encounter: Payer: Self-pay | Admitting: Medical Oncology

## 2023-11-12 ENCOUNTER — Inpatient Hospital Stay: Payer: Medicare Other | Admitting: Medical Oncology

## 2023-11-12 VITALS — BP 150/69 | HR 57 | Temp 97.8°F | Resp 18 | Ht 62.0 in | Wt 91.0 lb

## 2023-11-12 DIAGNOSIS — L80 Vitiligo: Secondary | ICD-10-CM | POA: Insufficient documentation

## 2023-11-12 DIAGNOSIS — D510 Vitamin B12 deficiency anemia due to intrinsic factor deficiency: Secondary | ICD-10-CM

## 2023-11-12 DIAGNOSIS — D693 Immune thrombocytopenic purpura: Secondary | ICD-10-CM | POA: Insufficient documentation

## 2023-11-12 DIAGNOSIS — D500 Iron deficiency anemia secondary to blood loss (chronic): Secondary | ICD-10-CM | POA: Insufficient documentation

## 2023-11-12 DIAGNOSIS — E039 Hypothyroidism, unspecified: Secondary | ICD-10-CM | POA: Insufficient documentation

## 2023-11-12 DIAGNOSIS — E538 Deficiency of other specified B group vitamins: Secondary | ICD-10-CM

## 2023-11-12 DIAGNOSIS — M069 Rheumatoid arthritis, unspecified: Secondary | ICD-10-CM | POA: Diagnosis not present

## 2023-11-12 LAB — CBC WITH DIFFERENTIAL (CANCER CENTER ONLY)
Abs Immature Granulocytes: 0.01 10*3/uL (ref 0.00–0.07)
Basophils Absolute: 0.1 10*3/uL (ref 0.0–0.1)
Basophils Relative: 2 %
Eosinophils Absolute: 0.1 10*3/uL (ref 0.0–0.5)
Eosinophils Relative: 2 %
HCT: 34.1 % — ABNORMAL LOW (ref 36.0–46.0)
Hemoglobin: 11.2 g/dL — ABNORMAL LOW (ref 12.0–15.0)
Immature Granulocytes: 0 %
Lymphocytes Relative: 39 %
Lymphs Abs: 2.3 10*3/uL (ref 0.7–4.0)
MCH: 34.4 pg — ABNORMAL HIGH (ref 26.0–34.0)
MCHC: 32.8 g/dL (ref 30.0–36.0)
MCV: 104.6 fL — ABNORMAL HIGH (ref 80.0–100.0)
Monocytes Absolute: 0.9 10*3/uL (ref 0.1–1.0)
Monocytes Relative: 16 %
Neutro Abs: 2.5 10*3/uL (ref 1.7–7.7)
Neutrophils Relative %: 41 %
Platelet Count: 231 10*3/uL (ref 150–400)
RBC: 3.26 MIL/uL — ABNORMAL LOW (ref 3.87–5.11)
RDW: 13.2 % (ref 11.5–15.5)
WBC Count: 5.9 10*3/uL (ref 4.0–10.5)
nRBC: 0 % (ref 0.0–0.2)

## 2023-11-12 LAB — CMP (CANCER CENTER ONLY)
ALT: 11 U/L (ref 0–44)
AST: 26 U/L (ref 15–41)
Albumin: 4.2 g/dL (ref 3.5–5.0)
Alkaline Phosphatase: 82 U/L (ref 38–126)
Anion gap: 10 (ref 5–15)
BUN: 19 mg/dL (ref 8–23)
CO2: 28 mmol/L (ref 22–32)
Calcium: 9.1 mg/dL (ref 8.9–10.3)
Chloride: 96 mmol/L — ABNORMAL LOW (ref 98–111)
Creatinine: 0.69 mg/dL (ref 0.44–1.00)
GFR, Estimated: >60 mL/min (ref >60)
Glucose, Bld: 110 mg/dL — ABNORMAL HIGH (ref 70–99)
Potassium: 3.6 mmol/L (ref 3.5–5.1)
Sodium: 134 mmol/L — ABNORMAL LOW (ref 135–145)
Total Bilirubin: 0.6 mg/dL (ref 0.0–1.2)
Total Protein: 6.3 g/dL — ABNORMAL LOW (ref 6.5–8.1)

## 2023-11-12 LAB — VITAMIN B12: Vitamin B-12: 432 pg/mL (ref 180–914)

## 2023-11-12 LAB — FERRITIN: Ferritin: 34 ng/mL (ref 11–307)

## 2023-11-12 MED ORDER — DRONABINOL 2.5 MG PO CAPS: 2.5000 mg | ORAL_CAPSULE | Freq: Two times a day (BID) | ORAL | 3 refills | Status: DC

## 2023-11-12 NOTE — Progress Notes (Signed)
Hematology and Oncology Follow Up Visit  Kathleen Pace [id number] Aug 13, 1941 83 y.o. 11/12/2023   Principle Diagnosis:  Chronic immune thrombocytopenia, remission. 2. Pernicious anemia. 3. Vitiligo. 4. Rheumatoid Arthritis 5. Iron Deficiency Anemia  Current Therapy:   Vitamin B12 1 mg IM Q. month - she self administers IV Iron as needed --Monoferric given on 01/08/2022      Interim History:  Ms.  Kathleen Pace is back for follow-up.   Today she states that she is doing fair. She still is having grief from losing her son. This has stolen her appetite.   She has not taken her B12 for the month- she will try to take this today when she gets home.   She has had no problems with bowels or bladder.  Mild intermittent loose stools.   She is taking the Eliquis and doing well without side effects. Rare nosebleeds which she is able to stop fairly quickly.   There has been no other bleeding to her knowledge: denies epistaxis, gingivitis, hemoptysis, hematemesis, hematuria, melena, excessive bruising, blood donation.   No abdominal pain.   There has been no palpitations.  She has had no cardiac issues.  Her last iron studies back in August showed a ferritin of 57 with an iron saturation of 34%.  Appetite is down- she asks again about Marinol. This was prescribed before but insurance declined. She has new insurance and she wishes to try this again.   Currently, I would say that her performance status is probably ECOG 1. Wt Readings from Last 3 Encounters:  11/12/23 91 lb (41.3 kg)  10/12/23 96 lb (43.5 kg)  07/02/23 99 lb (44.9 kg)   Medications:  Current Outpatient Medications:    acetaminophen (TYLENOL) 650 MG CR tablet, Take by mouth., Disp: , Rfl:    albuterol (PROVENTIL HFA;VENTOLIN HFA) 108 (90 Base) MCG/ACT inhaler, Inhale 2 puffs into the lungs every 4 (four) hours as needed., Disp: , Rfl:    ALPRAZolam (XANAX) 0.25 MG tablet, Take 0.25 mg by mouth 2 (two) times daily as needed  for anxiety., Disp: , Rfl:    amLODipine (NORVASC) 10 MG tablet, Take 10 mg by mouth daily., Disp: , Rfl:    apixaban (ELIQUIS) 5 MG TABS tablet, Take 5 mg by mouth 2 (two) times daily., Disp: , Rfl:    aspirin 81 MG EC tablet, Take 1 tablet by mouth daily., Disp: , Rfl:    atorvastatin (LIPITOR) 80 MG tablet, SMARTSIG:1 Tablet(s) By Mouth Every Evening, Disp: , Rfl:    CALCIUM-VITAMIN D PO, Take 1 capsule by mouth every morning. Calcium 600 mg  Vitamin D 200, Disp: , Rfl:    Cholecalciferol (VITAMIN D) 2000 UNITS tablet, Take 2,000 Units by mouth daily., Disp: , Rfl:    cyanocobalamin (VITAMIN B12) 1000 MCG/ML injection, INJECT 1 ML EVERY MONTH INTO YOUR MUSCLE, Disp: 3 mL, Rfl: 24   dronabinol (MARINOL) 2.5 MG capsule, Take 1 capsule (2.5 mg total) by mouth 2 (two) times daily before a meal., Disp: 60 capsule, Rfl: 0   esomeprazole (NEXIUM) 20 MG capsule, Take 20 mg by mouth daily at 12 noon., Disp: , Rfl:    hydroxychloroquine (PLAQUENIL) 200 MG tablet, 200 mg daily. , Disp: , Rfl:    leflunomide (ARAVA) 10 MG tablet, Take 10 mg by mouth daily. Take 1 and 1/2 tablet total of 15 mg daily., Disp: , Rfl:    levothyroxine (SYNTHROID, LEVOTHROID) 125 MCG tablet, 125 mcg daily. 08/31/2019 One day each week, will  increase to 1.5 tabs., Disp: , Rfl:    metoprolol succinate (TOPROL-XL) 25 MG 24 hr tablet, Take 25 mg by mouth daily., Disp: , Rfl:    ondansetron (ZOFRAN-ODT) 8 MG disintegrating tablet, Take 8 mg by mouth every 8 (eight) hours as needed., Disp: , Rfl:    prednisoLONE acetate (PRED FORTE) 1 % ophthalmic suspension, Place 1 drop into both eyes daily., Disp: , Rfl:    sertraline (ZOLOFT) 50 MG tablet, Take 50 mg by mouth daily., Disp: , Rfl:    umeclidinium-vilanterol (ANORO ELLIPTA) 62.5-25 MCG/INH AEPB, Inhale into the lungs daily. , Disp: , Rfl:    valsartan (DIOVAN) 320 MG tablet, Take by mouth daily. , Disp: , Rfl:    naloxone (NARCAN) nasal spray 4 mg/0.1 mL, Place 1 spray into the nose  once. (Patient not taking: Reported on 11/12/2023), Disp: , Rfl:   Allergies:  Allergies  Allergen Reactions   Iodinated Contrast Media Swelling   Sulfa Antibiotics Other (See Comments)    Drops Blood Pressure   Methotrexate Nausea Only and Other (See Comments)    Nose bleeds   Spironolactone Rash    Rash with itching unresponsive to benadryl.    Doxycycline Nausea Only   Penicillins Rash    Patient tolerated cefazolin on 01/10/21   Quinine Derivatives Other (See Comments)    Patient doesn't remember reaction    Past Medical History, Surgical history, Social history, and Family History were reviewed and updated.  Review of Systems: Review of Systems  Constitutional:  Positive for malaise/fatigue.  HENT:  Positive for congestion.   Eyes: Negative.   Respiratory:  Positive for cough.   Cardiovascular:  Positive for palpitations.  Gastrointestinal:  Positive for diarrhea and nausea.  Genitourinary: Negative.   Musculoskeletal:  Positive for joint pain and myalgias.  Skin: Negative.   Neurological: Negative.   Endo/Heme/Allergies: Negative.   Psychiatric/Behavioral: Negative.     Physical Exam:  height is 5\' 2"  (1.575 m) and weight is 91 lb (41.3 kg). Her oral temperature is 97.8 F (36.6 C). Her blood pressure is 150/69 (abnormal) and her pulse is 57 (abnormal). Her respiration is 18 and oxygen saturation is 97%.   Physical Exam Vitals reviewed.  Constitutional:      Comments: Thin, Frail. Elderly   HENT:     Head: Normocephalic and atraumatic.  Eyes:     Pupils: Pupils are equal, round, and reactive to light.  Cardiovascular:     Rate and Rhythm: Normal rate and regular rhythm.     Heart sounds: Normal heart sounds.  Pulmonary:     Effort: Pulmonary effort is normal.     Breath sounds: Normal breath sounds.  Abdominal:     General: Bowel sounds are normal.     Palpations: Abdomen is soft.  Musculoskeletal:        General: No tenderness or deformity. Normal range  of motion.     Cervical back: Normal range of motion.  Lymphadenopathy:     Cervical: No cervical adenopathy.  Skin:    General: Skin is warm and dry.     Findings: No erythema or rash.  Neurological:     Mental Status: She is alert and oriented to person, place, and time.  Psychiatric:        Behavior: Behavior normal.        Thought Content: Thought content normal.        Judgment: Judgment normal.      Lab Results  Component Value  Date   WBC 5.9 11/12/2023   HGB 11.2 (L) 11/12/2023   HCT 34.1 (L) 11/12/2023   MCV 104.6 (H) 11/12/2023   PLT 231 11/12/2023     Chemistry      Component Value Date/Time   NA 134 (L) 11/12/2023 1442   NA 144 09/11/2017 1335   NA 138 07/31/2016 0947   K 3.6 11/12/2023 1442   K 4.2 09/11/2017 1335   K 4.3 07/31/2016 0947   CL 96 (L) 11/12/2023 1442   CL 104 09/11/2017 1335   CO2 28 11/12/2023 1442   CO2 28 09/11/2017 1335   CO2 27 07/31/2016 0947   BUN 19 11/12/2023 1442   BUN 17 09/11/2017 1335   BUN 14.7 07/31/2016 0947   CREATININE 0.69 11/12/2023 1442   CREATININE 0.9 09/11/2017 1335   CREATININE 0.7 07/31/2016 0947      Component Value Date/Time   CALCIUM 9.1 11/12/2023 1442   CALCIUM 9.0 09/11/2017 1335   CALCIUM 9.2 07/31/2016 0947   ALKPHOS 82 11/12/2023 1442   ALKPHOS 102 (H) 09/11/2017 1335   ALKPHOS 70 07/31/2016 0947   AST 26 11/12/2023 1442   AST 18 07/31/2016 0947   ALT 11 11/12/2023 1442   ALT 20 09/11/2017 1335   ALT 9 07/31/2016 0947   BILITOT 0.6 11/12/2023 1442   BILITOT 0.62 07/31/2016 0947     Encounter Diagnoses  Name Primary?   Iron deficiency anemia due to chronic blood loss Yes   Idiopathic thrombocytopenic purpura (HCC)    Pernicious anemia     Impression and Plan:  Ms. Sessum is 83 year old white female. She has a clear autoimmune issue. She has pernicious anemia. She has vitiligo. She has hypothyroidism.  She is on Plaquenil for the rheumatism.  In terms of her ITP this is well  controlled today with a normal platelet count.   Iron studied pending- likely low- will replace if needed.   RTC 1 month APP, labs (CBC, CMP, B12, iron, ferritin)   Rushie Chestnut, PA-C 1/23/20253:26 PM

## 2023-11-13 LAB — IRON AND IRON BINDING CAPACITY (CC-WL,HP ONLY)
Iron: 51 ug/dL (ref 28–170)
Saturation Ratios: 15 % (ref 10.4–31.8)
TIBC: 335 ug/dL (ref 250–450)
UIBC: 284 ug/dL (ref 148–442)

## 2023-11-16 ENCOUNTER — Other Ambulatory Visit: Payer: Self-pay | Admitting: Medical Oncology

## 2023-11-18 ENCOUNTER — Telehealth: Payer: Self-pay | Admitting: *Deleted

## 2023-11-18 NOTE — Telephone Encounter (Addendum)
-----   Message from Nurse Laverna Peace H sent at 11/17/2023  8:53 AM EST ----- Cov updated her orders to Venofer 300mg  per Abbe [place]. dph ----- Message ----- From: Karen Kitchens Sent: 11/16/2023   2:23 PM EST To: Doneta Public; Onc Nurse Hp  Called patient to leave following message but had to leave VM on private voicemail to call us back.    "Her B12 is on the lower side - Let's encourage her to do her B12 shot if she has not already for the month Iron is also on the lower side- Let's schedule her for Venofer treatment.  2 doses.  Scheduling message sent

## 2023-11-30 ENCOUNTER — Inpatient Hospital Stay: Payer: Medicare Other | Attending: Medical Oncology

## 2023-11-30 VITALS — BP 144/51 | HR 62 | Temp 97.9°F | Resp 18

## 2023-11-30 DIAGNOSIS — Z79899 Other long term (current) drug therapy: Secondary | ICD-10-CM | POA: Insufficient documentation

## 2023-11-30 DIAGNOSIS — Z7982 Long term (current) use of aspirin: Secondary | ICD-10-CM | POA: Diagnosis not present

## 2023-11-30 DIAGNOSIS — R5383 Other fatigue: Secondary | ICD-10-CM | POA: Insufficient documentation

## 2023-11-30 DIAGNOSIS — Z79890 Hormone replacement therapy (postmenopausal): Secondary | ICD-10-CM | POA: Diagnosis not present

## 2023-11-30 DIAGNOSIS — D500 Iron deficiency anemia secondary to blood loss (chronic): Secondary | ICD-10-CM | POA: Insufficient documentation

## 2023-11-30 DIAGNOSIS — L80 Vitiligo: Secondary | ICD-10-CM | POA: Insufficient documentation

## 2023-11-30 DIAGNOSIS — Z7901 Long term (current) use of anticoagulants: Secondary | ICD-10-CM | POA: Diagnosis not present

## 2023-11-30 DIAGNOSIS — Z7969 Long term (current) use of other immunomodulators and immunosuppressants: Secondary | ICD-10-CM | POA: Insufficient documentation

## 2023-11-30 DIAGNOSIS — I2699 Other pulmonary embolism without acute cor pulmonale: Secondary | ICD-10-CM | POA: Diagnosis not present

## 2023-11-30 DIAGNOSIS — D693 Immune thrombocytopenic purpura: Secondary | ICD-10-CM | POA: Diagnosis not present

## 2023-11-30 DIAGNOSIS — K909 Intestinal malabsorption, unspecified: Secondary | ICD-10-CM | POA: Diagnosis not present

## 2023-11-30 DIAGNOSIS — D510 Vitamin B12 deficiency anemia due to intrinsic factor deficiency: Secondary | ICD-10-CM | POA: Insufficient documentation

## 2023-11-30 DIAGNOSIS — E039 Hypothyroidism, unspecified: Secondary | ICD-10-CM | POA: Insufficient documentation

## 2023-11-30 DIAGNOSIS — M069 Rheumatoid arthritis, unspecified: Secondary | ICD-10-CM | POA: Insufficient documentation

## 2023-11-30 DIAGNOSIS — R197 Diarrhea, unspecified: Secondary | ICD-10-CM | POA: Insufficient documentation

## 2023-11-30 MED ORDER — SODIUM CHLORIDE 0.9 % IV SOLN
INTRAVENOUS | Status: DC
Start: 2023-11-30 — End: 2023-11-30

## 2023-11-30 MED ORDER — SODIUM CHLORIDE 0.9 % IV SOLN
300.0000 mg | Freq: Once | INTRAVENOUS | Status: AC
  Administered 2023-11-30: 300 mg via INTRAVENOUS
  Filled 2023-11-30: qty 300

## 2023-11-30 NOTE — Patient Instructions (Addendum)

## 2023-12-07 ENCOUNTER — Inpatient Hospital Stay: Payer: Medicare Other

## 2023-12-07 VITALS — BP 164/58 | HR 64 | Temp 97.8°F | Resp 20

## 2023-12-07 DIAGNOSIS — D500 Iron deficiency anemia secondary to blood loss (chronic): Secondary | ICD-10-CM

## 2023-12-07 DIAGNOSIS — K909 Intestinal malabsorption, unspecified: Secondary | ICD-10-CM

## 2023-12-07 MED ORDER — SODIUM CHLORIDE 0.9 % IV SOLN: INTRAVENOUS | Status: DC

## 2023-12-07 MED ORDER — SODIUM CHLORIDE 0.9 % IV SOLN
300.0000 mg | Freq: Once | INTRAVENOUS | Status: AC
  Administered 2023-12-07: 300 mg via INTRAVENOUS
  Filled 2023-12-07: qty 300

## 2023-12-07 NOTE — Patient Instructions (Signed)

## 2023-12-10 ENCOUNTER — Ambulatory Visit: Payer: Medicare Other | Admitting: Medical Oncology

## 2023-12-10 ENCOUNTER — Other Ambulatory Visit: Payer: Medicare Other

## 2023-12-15 ENCOUNTER — Inpatient Hospital Stay: Payer: Medicare Other

## 2023-12-15 ENCOUNTER — Inpatient Hospital Stay: Payer: Medicare Other | Admitting: Medical Oncology

## 2023-12-15 ENCOUNTER — Encounter: Payer: Self-pay | Admitting: Medical Oncology

## 2023-12-15 VITALS — BP 159/54 | HR 57 | Temp 97.7°F | Resp 18 | Ht 62.0 in | Wt 92.4 lb

## 2023-12-15 DIAGNOSIS — K909 Intestinal malabsorption, unspecified: Secondary | ICD-10-CM | POA: Diagnosis not present

## 2023-12-15 DIAGNOSIS — D500 Iron deficiency anemia secondary to blood loss (chronic): Secondary | ICD-10-CM

## 2023-12-15 DIAGNOSIS — D693 Immune thrombocytopenic purpura: Secondary | ICD-10-CM

## 2023-12-15 DIAGNOSIS — D510 Vitamin B12 deficiency anemia due to intrinsic factor deficiency: Secondary | ICD-10-CM | POA: Diagnosis not present

## 2023-12-15 LAB — CMP (CANCER CENTER ONLY)
ALT: 20 U/L (ref 0–44)
AST: 38 U/L (ref 15–41)
Albumin: 3.9 g/dL (ref 3.5–5.0)
Alkaline Phosphatase: 77 U/L (ref 38–126)
Anion gap: 11 (ref 5–15)
BUN: 24 mg/dL — ABNORMAL HIGH (ref 8–23)
CO2: 27 mmol/L (ref 22–32)
Calcium: 9.3 mg/dL (ref 8.9–10.3)
Chloride: 96 mmol/L — ABNORMAL LOW (ref 98–111)
Creatinine: 1.08 mg/dL — ABNORMAL HIGH (ref 0.44–1.00)
GFR, Estimated: 51 mL/min — ABNORMAL LOW (ref >60)
Glucose, Bld: 91 mg/dL (ref 70–99)
Potassium: 5.2 mmol/L — ABNORMAL HIGH (ref 3.5–5.1)
Sodium: 134 mmol/L — ABNORMAL LOW (ref 135–145)
Total Bilirubin: 0.6 mg/dL (ref 0.0–1.2)
Total Protein: 6.7 g/dL (ref 6.5–8.1)

## 2023-12-15 LAB — CBC
HCT: 34.6 % — ABNORMAL LOW (ref 36.0–46.0)
Hemoglobin: 11.2 g/dL — ABNORMAL LOW (ref 12.0–15.0)
MCH: 33.9 pg (ref 26.0–34.0)
MCHC: 32.4 g/dL (ref 30.0–36.0)
MCV: 104.8 fL — ABNORMAL HIGH (ref 80.0–100.0)
Platelets: 244 10*3/uL (ref 150–400)
RBC: 3.3 MIL/uL — ABNORMAL LOW (ref 3.87–5.11)
RDW: 13.5 % (ref 11.5–15.5)
WBC: 5.7 10*3/uL (ref 4.0–10.5)
nRBC: 0 % (ref 0.0–0.2)

## 2023-12-15 LAB — FERRITIN: Ferritin: 348 ng/mL — ABNORMAL HIGH (ref 11–307)

## 2023-12-15 LAB — VITAMIN B12: Vitamin B-12: 4906 pg/mL — ABNORMAL HIGH (ref 180–914)

## 2023-12-15 MED ORDER — MIRTAZAPINE 7.5 MG PO TABS: 7.5000 mg | ORAL_TABLET | Freq: Every day | ORAL | 6 refills | Status: DC

## 2023-12-15 NOTE — Progress Notes (Signed)
 Hematology and Oncology Follow Up Visit  Kathleen Pace [id number] [birth year] 83 y.o. 12/15/2023   Principle Diagnosis:  Chronic immune thrombocytopenia, remission. 2. Pernicious anemia. 3. Vitiligo. 4. Rheumatoid Arthritis 5. Iron Deficiency Anemia 6. Pulmonary Embolism -09/10/2023  Current Therapy:   Vitamin B12 1 mg IM Q. month - she self administers- last injections given yesterday Eliquis- managed by PCP/Cardiology  IV Iron as needed -- Monoferric given on 01/08/2022  Venofer given on 11/30/2023 and 12/07/2023      Interim History:  Kathleen Pace is back for follow-up.   Today she states that she has been doing better than her last visit. Fatigue has improved.   She has not taken her B12 for the month- she will try to take this today when she gets home.   She has had no problems with bowels or bladder.  Mild intermittent loose stools.   She is taking the Eliquis and doing well without side effects. Rare nosebleeds which she is able to stop fairly quickly.   There has been no other bleeding to her knowledge: denies epistaxis, gingivitis, hemoptysis, hematemesis, hematuria, melena, excessive bruising, blood donation.   No abdominal pain.   There has been no palpitations.  She has had no cardiac issues.  Her last iron studies back in January showed a ferritin of 34 with an iron saturation of 15%. So she was given 2 doses of IV Venofer.   Appetite remains down- She is waiting for Marinol to be approved. She was tried on Mirtazapine along with her Zoloft while inpatient from her PE.She reports that it did not work but she tolerated it well. According to hospital notes she did gain weight in the ER once started on this medication. We will restart and titrate up if it is felt to be ineffective initially while we await for her Marinol to be approved. Kathleen Pace   Currently, Kathleen Pace would say that her performance status is probably ECOG 1. Wt Readings from Last 3 Encounters:  12/15/23 92 lb  6.4 oz (41.9 kg)  11/12/23 91 lb (41.3 kg)  10/12/23 96 lb (43.5 kg)   Medications:  Current Outpatient Medications:    acetaminophen (TYLENOL) 650 MG CR tablet, Take by mouth., Disp: , Rfl:    albuterol (PROVENTIL HFA;VENTOLIN HFA) 108 (90 Base) MCG/ACT inhaler, Inhale 2 puffs into the lungs every 4 (four) hours as needed., Disp: , Rfl:    ALPRAZolam (XANAX) 0.25 MG tablet, Take 0.25 mg by mouth 2 (two) times daily as needed for anxiety., Disp: , Rfl:    amLODipine (NORVASC) 10 MG tablet, Take 10 mg by mouth daily., Disp: , Rfl:    aspirin 81 MG EC tablet, Take 1 tablet by mouth daily., Disp: , Rfl:    atorvastatin (LIPITOR) 80 MG tablet, SMARTSIG:1 Tablet(s) By Mouth Every Evening, Disp: , Rfl:    CALCIUM-VITAMIN D PO, Take 1 capsule by mouth every morning. Calcium 600 mg  Vitamin D 200, Disp: , Rfl:    Cholecalciferol (VITAMIN D) 2000 UNITS tablet, Take 2,000 Units by mouth daily., Disp: , Rfl:    cyanocobalamin (VITAMIN B12) 1000 MCG/ML injection, INJECT 1 ML EVERY MONTH INTO YOUR MUSCLE, Disp: 3 mL, Rfl: 24   dronabinol (MARINOL) 2.5 MG capsule, Take 1 capsule (2.5 mg total) by mouth 2 (two) times daily before a meal., Disp: 60 capsule, Rfl: 0   dronabinol (MARINOL) 2.5 MG capsule, Take 1 capsule (2.5 mg total) by mouth 2 (two) times daily before a meal., Disp:  60 capsule, Rfl: 3   esomeprazole (NEXIUM) 20 MG capsule, Take 20 mg by mouth daily at 12 noon., Disp: , Rfl:    hydroxychloroquine (PLAQUENIL) 200 MG tablet, 200 mg daily. , Disp: , Rfl:    leflunomide (ARAVA) 10 MG tablet, Take 10 mg by mouth daily. Take 1 and 1/2 tablet total of 15 mg daily., Disp: , Rfl:    levothyroxine (SYNTHROID, LEVOTHROID) 125 MCG tablet, 125 mcg daily. 08/31/2019 One day each week, will increase to 1.5 tabs., Disp: , Rfl:    metoprolol succinate (TOPROL-XL) 25 MG 24 hr tablet, Take 25 mg by mouth daily., Disp: , Rfl:    ondansetron (ZOFRAN-ODT) 8 MG disintegrating tablet, Take 8 mg by mouth every 8 (eight)  hours as needed., Disp: , Rfl:    prednisoLONE acetate (PRED FORTE) 1 % ophthalmic suspension, Place 1 drop into both eyes daily., Disp: , Rfl:    sertraline (ZOLOFT) 50 MG tablet, Take 50 mg by mouth daily., Disp: , Rfl:    umeclidinium-vilanterol (ANORO ELLIPTA) 62.5-25 MCG/INH AEPB, Inhale into the lungs daily. , Disp: , Rfl:    valsartan (DIOVAN) 320 MG tablet, Take by mouth daily. , Disp: , Rfl:    apixaban (ELIQUIS) 5 MG TABS tablet, Take 5 mg by mouth 2 (two) times daily., Disp: , Rfl:    naloxone (NARCAN) nasal spray 4 mg/0.1 mL, Place 1 spray into the nose once. (Patient not taking: Reported on 10/12/2023), Disp: , Rfl:   Allergies:  Allergies  Allergen Reactions   Iodinated Contrast Media Swelling   Sulfa Antibiotics Other (See Comments)    Drops Blood Pressure   Methotrexate Nausea Only and Other (See Comments)    Nose bleeds   Spironolactone Rash    Rash with itching unresponsive to benadryl.    Doxycycline Nausea Only   Penicillins Rash    Patient tolerated cefazolin on 01/10/21   Quinine Derivatives Other (See Comments)    Patient doesn't remember reaction    Past Medical History, Surgical history, Social history, and Family History were reviewed and updated.  Review of Systems: Review of Systems  Constitutional:  Positive for malaise/fatigue.  HENT:  Negative for congestion.   Eyes: Negative.   Respiratory:  Negative for cough.   Cardiovascular:  Positive for palpitations.  Gastrointestinal:  Negative for diarrhea and nausea.  Genitourinary: Negative.   Musculoskeletal:  Positive for joint pain and myalgias.  Skin: Negative.   Neurological: Negative.   Endo/Heme/Allergies: Negative.   Psychiatric/Behavioral: Negative.     Physical Exam:  height is 5\' 2"  (1.575 m) and weight is 92 lb 6.4 oz (41.9 kg). Her oral temperature is 97.7 F (36.5 C). Her blood pressure is 159/54 (abnormal) and her pulse is 57 (abnormal). Her respiration is 18 and oxygen saturation is  93%.   Physical Exam Vitals reviewed.  Constitutional:      Comments: Thin, Frail. Elderly   HENT:     Head: Normocephalic and atraumatic.  Eyes:     Pupils: Pupils are equal, round, and reactive to light.  Cardiovascular:     Rate and Rhythm: Normal rate and regular rhythm.     Heart sounds: Normal heart sounds.  Pulmonary:     Effort: Pulmonary effort is normal.     Breath sounds: Normal breath sounds.  Abdominal:     General: Bowel sounds are normal.     Palpations: Abdomen is soft.  Musculoskeletal:        General: No tenderness or deformity.  Normal range of motion.     Cervical back: Normal range of motion.  Lymphadenopathy:     Cervical: No cervical adenopathy.  Skin:    General: Skin is warm and dry.     Findings: No erythema or rash.  Neurological:     Mental Status: She is alert and oriented to person, place, and time.  Psychiatric:        Behavior: Behavior normal.        Thought Content: Thought content normal.        Judgment: Judgment normal.     Lab Results  Component Value Date   WBC 5.7 12/15/2023   HGB 11.2 (L) 12/15/2023   HCT 34.6 (L) 12/15/2023   MCV 104.8 (H) 12/15/2023   PLT 244 12/15/2023     Chemistry      Component Value Date/Time   NA 134 (L) 11/12/2023 1442   NA 144 09/11/2017 1335   NA 138 07/31/2016 0947   K 3.6 11/12/2023 1442   K 4.2 09/11/2017 1335   K 4.3 07/31/2016 0947   CL 96 (L) 11/12/2023 1442   CL 104 09/11/2017 1335   CO2 28 11/12/2023 1442   CO2 28 09/11/2017 1335   CO2 27 07/31/2016 0947   BUN 19 11/12/2023 1442   BUN 17 09/11/2017 1335   BUN 14.7 07/31/2016 0947   CREATININE 0.69 11/12/2023 1442   CREATININE 0.9 09/11/2017 1335   CREATININE 0.7 07/31/2016 0947      Component Value Date/Time   CALCIUM 9.1 11/12/2023 1442   CALCIUM 9.0 09/11/2017 1335   CALCIUM 9.2 07/31/2016 0947   ALKPHOS 82 11/12/2023 1442   ALKPHOS 102 (H) 09/11/2017 1335   ALKPHOS 70 07/31/2016 0947   AST 26 11/12/2023 1442   AST 18  07/31/2016 0947   ALT 11 11/12/2023 1442   ALT 20 09/11/2017 1335   ALT 9 07/31/2016 0947   BILITOT 0.6 11/12/2023 1442   BILITOT 0.62 07/31/2016 0947     Encounter Diagnoses  Name Primary?   Iron deficiency anemia due to chronic blood loss Yes   Malabsorption of iron    Idiopathic thrombocytopenic purpura (HCC)    Pernicious anemia     Impression and Plan:  Kathleen Pace is 83 year old white female. She has a clear autoimmune issue. She has pernicious anemia. She has vitiligo. She has hypothyroidism.  She is on Plaquenil for the rheumatism.  In terms of her ITP this is well controlled today with a normal platelet count.  Weight is still low. We will trial Mirtazapine again with the goal of increasing the dosage if needed in 6-8 weeks.   Today her Hgb is 11.2. MCV is improved at 104.8.  Iron studies pending. Will replace if needed    RTC 1 month APP, labs (CBC, CMP, B12, iron, ferritin)   Rushie Chestnut, PA-C 2/25/20252:34 PM

## 2023-12-16 ENCOUNTER — Telehealth: Payer: Self-pay | Admitting: *Deleted

## 2023-12-16 LAB — IRON AND IRON BINDING CAPACITY (CC-WL,HP ONLY)
Iron: 112 ug/dL (ref 28–170)
Saturation Ratios: 38 % — ABNORMAL HIGH (ref 10.4–31.8)
TIBC: 293 ug/dL (ref 250–450)
UIBC: 181 ug/dL (ref 148–442)

## 2023-12-16 NOTE — Telephone Encounter (Signed)
 As noted below by Clent Jacks, PA, I informed the patient that the B12 is high but you just got a B12 injection. Please continue the injections as previously planned. No change in plan today. She verbalized understanding.

## 2023-12-16 NOTE — Telephone Encounter (Signed)
-----   Message from Rushie Chestnut sent at 12/16/2023  2:40 PM EST ----- B12 is high but she just gave herself her B12 injection the day before so this is falsely elevated. She should continue with her injections as previously planned Ferritin falsely elevated- iron saturation is good- No change in plan today

## 2024-01-12 ENCOUNTER — Inpatient Hospital Stay: Payer: Medicare Other | Attending: Medical Oncology

## 2024-01-12 ENCOUNTER — Encounter: Payer: Self-pay | Admitting: Medical Oncology

## 2024-01-12 ENCOUNTER — Other Ambulatory Visit: Payer: Self-pay | Admitting: Medical Oncology

## 2024-01-12 ENCOUNTER — Inpatient Hospital Stay: Payer: Medicare Other | Admitting: Medical Oncology

## 2024-01-12 ENCOUNTER — Other Ambulatory Visit: Payer: Self-pay

## 2024-01-12 VITALS — BP 114/53 | HR 65 | Temp 97.7°F | Resp 19 | Ht 62.0 in | Wt 92.1 lb

## 2024-01-12 DIAGNOSIS — D510 Vitamin B12 deficiency anemia due to intrinsic factor deficiency: Secondary | ICD-10-CM

## 2024-01-12 DIAGNOSIS — D693 Immune thrombocytopenic purpura: Secondary | ICD-10-CM | POA: Diagnosis not present

## 2024-01-12 DIAGNOSIS — K909 Intestinal malabsorption, unspecified: Secondary | ICD-10-CM | POA: Diagnosis not present

## 2024-01-12 DIAGNOSIS — R630 Anorexia: Secondary | ICD-10-CM

## 2024-01-12 DIAGNOSIS — M069 Rheumatoid arthritis, unspecified: Secondary | ICD-10-CM | POA: Insufficient documentation

## 2024-01-12 DIAGNOSIS — D500 Iron deficiency anemia secondary to blood loss (chronic): Secondary | ICD-10-CM

## 2024-01-12 DIAGNOSIS — E039 Hypothyroidism, unspecified: Secondary | ICD-10-CM | POA: Insufficient documentation

## 2024-01-12 DIAGNOSIS — Z79899 Other long term (current) drug therapy: Secondary | ICD-10-CM | POA: Diagnosis not present

## 2024-01-12 DIAGNOSIS — Z7901 Long term (current) use of anticoagulants: Secondary | ICD-10-CM | POA: Diagnosis not present

## 2024-01-12 DIAGNOSIS — I2699 Other pulmonary embolism without acute cor pulmonale: Secondary | ICD-10-CM | POA: Diagnosis not present

## 2024-01-12 DIAGNOSIS — L80 Vitiligo: Secondary | ICD-10-CM | POA: Diagnosis not present

## 2024-01-12 DIAGNOSIS — E538 Deficiency of other specified B group vitamins: Secondary | ICD-10-CM

## 2024-01-12 DIAGNOSIS — Z7982 Long term (current) use of aspirin: Secondary | ICD-10-CM | POA: Insufficient documentation

## 2024-01-12 LAB — CMP (CANCER CENTER ONLY)
ALT: 16 U/L (ref 0–44)
AST: 29 U/L (ref 15–41)
Albumin: 4.1 g/dL (ref 3.5–5.0)
Alkaline Phosphatase: 99 U/L (ref 38–126)
Anion gap: 7 (ref 5–15)
BUN: 18 mg/dL (ref 8–23)
CO2: 27 mmol/L (ref 22–32)
Calcium: 9 mg/dL (ref 8.9–10.3)
Chloride: 101 mmol/L (ref 98–111)
Creatinine: 0.77 mg/dL (ref 0.44–1.00)
GFR, Estimated: >60 mL/min (ref >60)
Glucose, Bld: 112 mg/dL — ABNORMAL HIGH (ref 70–99)
Potassium: 4.2 mmol/L (ref 3.5–5.1)
Sodium: 135 mmol/L (ref 135–145)
Total Bilirubin: 0.6 mg/dL (ref 0.0–1.2)
Total Protein: 6.3 g/dL — ABNORMAL LOW (ref 6.5–8.1)

## 2024-01-12 LAB — CBC WITH DIFFERENTIAL (CANCER CENTER ONLY)
Abs Immature Granulocytes: 0.01 10*3/uL (ref 0.00–0.07)
Basophils Absolute: 0.1 10*3/uL (ref 0.0–0.1)
Basophils Relative: 2 %
Eosinophils Absolute: 0.1 10*3/uL (ref 0.0–0.5)
Eosinophils Relative: 2 %
HCT: 36.9 % (ref 36.0–46.0)
Hemoglobin: 12.3 g/dL (ref 12.0–15.0)
Immature Granulocytes: 0 %
Lymphocytes Relative: 46 %
Lymphs Abs: 3.3 10*3/uL (ref 0.7–4.0)
MCH: 34.5 pg — ABNORMAL HIGH (ref 26.0–34.0)
MCHC: 33.3 g/dL (ref 30.0–36.0)
MCV: 103.4 fL — ABNORMAL HIGH (ref 80.0–100.0)
Monocytes Absolute: 1 10*3/uL (ref 0.1–1.0)
Monocytes Relative: 14 %
Neutro Abs: 2.5 10*3/uL (ref 1.7–7.7)
Neutrophils Relative %: 36 %
Platelet Count: 291 10*3/uL (ref 150–400)
RBC: 3.57 MIL/uL — ABNORMAL LOW (ref 3.87–5.11)
RDW: 13.8 % (ref 11.5–15.5)
WBC Count: 7.1 10*3/uL (ref 4.0–10.5)
nRBC: 0 % (ref 0.0–0.2)

## 2024-01-12 LAB — IRON AND IRON BINDING CAPACITY (CC-WL,HP ONLY)
Iron: 77 ug/dL (ref 28–170)
Saturation Ratios: 28 % (ref 10.4–31.8)
TIBC: 274 ug/dL (ref 250–450)
UIBC: 197 ug/dL (ref 148–442)

## 2024-01-12 LAB — VITAMIN B12: Vitamin B-12: 295 pg/mL (ref 180–914)

## 2024-01-12 LAB — FERRITIN: Ferritin: 127 ng/mL (ref 11–307)

## 2024-01-12 NOTE — Progress Notes (Signed)
 Hematology and Oncology Follow Up Visit  Kathleen Pace [id number] 1940/12/07 83 y.o. 01/12/2024   Principle Diagnosis:  Chronic immune thrombocytopenia, remission. 2. Pernicious anemia. 3. Vitiligo. 4. Rheumatoid Arthritis 5. Iron Deficiency Anemia 6. Pulmonary Embolism -09/10/2023  Current Therapy:   Vitamin B12 1 mg IM Q. month - she self administers- last injections given yesterday Eliquis- managed by PCP/Cardiology  IV Iron as needed -- Monoferric given on 01/08/2022  Venofer given on 11/30/2023 and 12/07/2023      Interim History:  Ms.  Pace is back for follow-up.   She states that she has been doing well. She has no concerns today. She states that overall she is feeling better s/p Iv iron.   She is back to taking her B12 injections which she administers herself.   Appetite is slightly better now that she is on the Mirtazapine.   She has had no problems with bowels or bladder.  Mild intermittent loose stools.   She is taking the Eliquis and doing well without side effects.   There has been no other bleeding to her knowledge: denies epistaxis, gingivitis, hemoptysis, hematemesis, hematuria, melena, excessive bruising, blood donation.   No abdominal pain.   There has been no palpitations.  She has had no cardiac issues.  Her last iron studies back in Feb showed a ferritin of 348 with an iron saturation of 38% s/p Venofer on  11/30/2023 and 12/07/2023)  Currently, I would say that her performance status is probably ECOG 1. Wt Readings from Last 3 Encounters:  01/12/24 92 lb 1.9 oz (41.8 kg)  12/15/23 92 lb 6.4 oz (41.9 kg)  11/12/23 91 lb (41.3 kg)   Medications:  Current Outpatient Medications:    acetaminophen (TYLENOL) 650 MG CR tablet, Take by mouth., Disp: , Rfl:    albuterol (PROVENTIL HFA;VENTOLIN HFA) 108 (90 Base) MCG/ACT inhaler, Inhale 2 puffs into the lungs every 4 (four) hours as needed., Disp: , Rfl:    ALPRAZolam (XANAX) 0.25 MG tablet, Take  0.25 mg by mouth 2 (two) times daily as needed for anxiety., Disp: , Rfl:    amLODipine (NORVASC) 10 MG tablet, Take 10 mg by mouth daily., Disp: , Rfl:    aspirin 81 MG EC tablet, Take 1 tablet by mouth daily., Disp: , Rfl:    atorvastatin (LIPITOR) 80 MG tablet, SMARTSIG:1 Tablet(s) By Mouth Every Evening, Disp: , Rfl:    CALCIUM-VITAMIN D PO, Take 1 capsule by mouth every morning. Calcium 600 mg  Vitamin D 200, Disp: , Rfl:    Cholecalciferol (VITAMIN D) 2000 UNITS tablet, Take 2,000 Units by mouth daily., Disp: , Rfl:    cyanocobalamin (VITAMIN B12) 1000 MCG/ML injection, INJECT 1 ML EVERY MONTH INTO YOUR MUSCLE, Disp: 3 mL, Rfl: 24   dronabinol (MARINOL) 2.5 MG capsule, Take 1 capsule (2.5 mg total) by mouth 2 (two) times daily before a meal., Disp: 60 capsule, Rfl: 3   esomeprazole (NEXIUM) 20 MG capsule, Take 20 mg by mouth daily at 12 noon., Disp: , Rfl:    hydroxychloroquine (PLAQUENIL) 200 MG tablet, 200 mg daily. , Disp: , Rfl:    leflunomide (ARAVA) 10 MG tablet, Take 10 mg by mouth daily. Take 1 and 1/2 tablet total of 15 mg daily., Disp: , Rfl:    levothyroxine (SYNTHROID, LEVOTHROID) 125 MCG tablet, 125 mcg daily. 08/31/2019 One day each week, will increase to 1.5 tabs., Disp: , Rfl:    metoprolol succinate (TOPROL-XL) 25 MG 24 hr tablet, Take 25  mg by mouth daily., Disp: , Rfl:    mirtazapine (REMERON) 7.5 MG tablet, Take 1 tablet (7.5 mg total) by mouth at bedtime., Disp: 30 tablet, Rfl: 6   ondansetron (ZOFRAN-ODT) 8 MG disintegrating tablet, Take 8 mg by mouth every 8 (eight) hours as needed., Disp: , Rfl:    prednisoLONE acetate (PRED FORTE) 1 % ophthalmic suspension, Place 1 drop into both eyes daily., Disp: , Rfl:    sertraline (ZOLOFT) 50 MG tablet, Take 50 mg by mouth daily., Disp: , Rfl:    umeclidinium-vilanterol (ANORO ELLIPTA) 62.5-25 MCG/INH AEPB, Inhale into the lungs daily. , Disp: , Rfl:    valsartan (DIOVAN) 320 MG tablet, Take by mouth daily. , Disp: , Rfl:     apixaban (ELIQUIS) 5 MG TABS tablet, Take 5 mg by mouth 2 (two) times daily., Disp: , Rfl:    dronabinol (MARINOL) 2.5 MG capsule, Take 1 capsule (2.5 mg total) by mouth 2 (two) times daily before a meal. (Patient not taking: Reported on 01/12/2024), Disp: 60 capsule, Rfl: 0   naloxone (NARCAN) nasal spray 4 mg/0.1 mL, Place 1 spray into the nose once. (Patient not taking: Reported on 01/12/2024), Disp: , Rfl:   Allergies:  Allergies  Allergen Reactions   Iodinated Contrast Media Swelling   Sulfa Antibiotics Other (See Comments)    Drops Blood Pressure   Methotrexate Nausea Only and Other (See Comments)    Nose bleeds   Spironolactone Rash    Rash with itching unresponsive to benadryl.    Doxycycline Nausea Only   Penicillins Rash    Patient tolerated cefazolin on 01/10/21   Quinine Derivatives Other (See Comments)    Patient doesn't remember reaction    Past Medical History, Surgical history, Social history, and Family History were reviewed and updated.  Review of Systems: Review of Systems  Constitutional:  Positive for malaise/fatigue.  HENT:  Negative for congestion.   Eyes: Negative.   Respiratory:  Negative for cough.   Cardiovascular:  Positive for palpitations.  Gastrointestinal:  Negative for diarrhea and nausea.  Genitourinary: Negative.   Musculoskeletal:  Positive for joint pain and myalgias.  Skin: Negative.   Neurological: Negative.   Endo/Heme/Allergies: Negative.   Psychiatric/Behavioral: Negative.     Physical Exam:  height is 5\' 2"  (1.575 m) and weight is 92 lb 1.9 oz (41.8 kg). Her oral temperature is 97.7 F (36.5 C). Her blood pressure is 114/53 (abnormal) and her pulse is 65. Her respiration is 19 and oxygen saturation is 92%.   Physical Exam Vitals reviewed.  Constitutional:      Comments: Thin, Frail. Elderly   HENT:     Head: Normocephalic and atraumatic.  Eyes:     Pupils: Pupils are equal, round, and reactive to light.  Cardiovascular:      Rate and Rhythm: Normal rate and regular rhythm.     Heart sounds: Normal heart sounds.  Pulmonary:     Effort: Pulmonary effort is normal.     Breath sounds: Normal breath sounds.  Abdominal:     General: Bowel sounds are normal.     Palpations: Abdomen is soft.  Musculoskeletal:        General: No tenderness or deformity. Normal range of motion.     Cervical back: Normal range of motion.  Lymphadenopathy:     Cervical: No cervical adenopathy.  Skin:    General: Skin is warm and dry.     Findings: No erythema or rash.  Neurological:  Mental Status: She is alert and oriented to person, place, and time.  Psychiatric:        Behavior: Behavior normal.        Thought Content: Thought content normal.        Judgment: Judgment normal.      Lab Results  Component Value Date   WBC 7.1 01/12/2024   HGB 12.3 01/12/2024   HCT 36.9 01/12/2024   MCV 103.4 (H) 01/12/2024   PLT 291 01/12/2024     Chemistry      Component Value Date/Time   NA 135 01/12/2024 1045   NA 144 09/11/2017 1335   NA 138 07/31/2016 0947   K 4.2 01/12/2024 1045   K 4.2 09/11/2017 1335   K 4.3 07/31/2016 0947   CL 101 01/12/2024 1045   CL 104 09/11/2017 1335   CO2 27 01/12/2024 1045   CO2 28 09/11/2017 1335   CO2 27 07/31/2016 0947   BUN 18 01/12/2024 1045   BUN 17 09/11/2017 1335   BUN 14.7 07/31/2016 0947   CREATININE 0.77 01/12/2024 1045   CREATININE 0.9 09/11/2017 1335   CREATININE 0.7 07/31/2016 0947      Component Value Date/Time   CALCIUM 9.0 01/12/2024 1045   CALCIUM 9.0 09/11/2017 1335   CALCIUM 9.2 07/31/2016 0947   ALKPHOS 99 01/12/2024 1045   ALKPHOS 102 (H) 09/11/2017 1335   ALKPHOS 70 07/31/2016 0947   AST 29 01/12/2024 1045   AST 18 07/31/2016 0947   ALT 16 01/12/2024 1045   ALT 20 09/11/2017 1335   ALT 9 07/31/2016 0947   BILITOT 0.6 01/12/2024 1045   BILITOT 0.62 07/31/2016 0947     Encounter Diagnoses  Name Primary?   Iron deficiency anemia due to chronic blood loss  Yes   Malabsorption of iron    Idiopathic thrombocytopenic purpura (HCC)    Vitamin B12 deficiency    Pernicious anemia    Loss of appetite    Impression and Plan:  Ms. Uplinger is 83 year old white female. She has a clear autoimmune issue. She has pernicious anemia. She has vitiligo. She has hypothyroidism.  She is on Plaquenil for the rheumatism.  ITP continues to be well controlled with a normal platelet count of 291 Happy to see that weight loss has halted. She will continue her Mirtazapine   Today her Hgb is 12.3 up from 11.2. MCV is improved at 103.4 from 104.8 Iron studies pending. Will replace if needed  She will continue her home B12 injections   RTC 2 months MD, labs (CBC, CMP, B12, iron, ferritin)   Rushie Chestnut, PA-C 3/25/20252:05 PM

## 2024-02-03 ENCOUNTER — Ambulatory Visit: Payer: Medicare Other | Admitting: Medical Oncology

## 2024-02-03 ENCOUNTER — Inpatient Hospital Stay: Payer: Medicare Other

## 2024-03-15 ENCOUNTER — Inpatient Hospital Stay (HOSPITAL_BASED_OUTPATIENT_CLINIC_OR_DEPARTMENT_OTHER): Admitting: Medical Oncology

## 2024-03-15 ENCOUNTER — Inpatient Hospital Stay: Attending: Medical Oncology

## 2024-03-15 ENCOUNTER — Encounter: Payer: Self-pay | Admitting: Medical Oncology

## 2024-03-15 VITALS — BP 115/55 | HR 57 | Temp 97.8°F | Resp 20 | Ht 62.0 in | Wt 91.4 lb

## 2024-03-15 DIAGNOSIS — D500 Iron deficiency anemia secondary to blood loss (chronic): Secondary | ICD-10-CM | POA: Insufficient documentation

## 2024-03-15 DIAGNOSIS — K909 Intestinal malabsorption, unspecified: Secondary | ICD-10-CM | POA: Insufficient documentation

## 2024-03-15 DIAGNOSIS — D510 Vitamin B12 deficiency anemia due to intrinsic factor deficiency: Secondary | ICD-10-CM | POA: Insufficient documentation

## 2024-03-15 DIAGNOSIS — R197 Diarrhea, unspecified: Secondary | ICD-10-CM | POA: Insufficient documentation

## 2024-03-15 DIAGNOSIS — Z79890 Hormone replacement therapy (postmenopausal): Secondary | ICD-10-CM | POA: Diagnosis not present

## 2024-03-15 DIAGNOSIS — L80 Vitiligo: Secondary | ICD-10-CM | POA: Insufficient documentation

## 2024-03-15 DIAGNOSIS — M069 Rheumatoid arthritis, unspecified: Secondary | ICD-10-CM | POA: Diagnosis not present

## 2024-03-15 DIAGNOSIS — E039 Hypothyroidism, unspecified: Secondary | ICD-10-CM | POA: Insufficient documentation

## 2024-03-15 DIAGNOSIS — Z7969 Long term (current) use of other immunomodulators and immunosuppressants: Secondary | ICD-10-CM | POA: Insufficient documentation

## 2024-03-15 DIAGNOSIS — R630 Anorexia: Secondary | ICD-10-CM

## 2024-03-15 DIAGNOSIS — Z79899 Other long term (current) drug therapy: Secondary | ICD-10-CM | POA: Insufficient documentation

## 2024-03-15 DIAGNOSIS — D693 Immune thrombocytopenic purpura: Secondary | ICD-10-CM | POA: Insufficient documentation

## 2024-03-15 DIAGNOSIS — I2699 Other pulmonary embolism without acute cor pulmonale: Secondary | ICD-10-CM | POA: Diagnosis not present

## 2024-03-15 DIAGNOSIS — E538 Deficiency of other specified B group vitamins: Secondary | ICD-10-CM | POA: Diagnosis not present

## 2024-03-15 DIAGNOSIS — Z7982 Long term (current) use of aspirin: Secondary | ICD-10-CM | POA: Insufficient documentation

## 2024-03-15 DIAGNOSIS — Z7901 Long term (current) use of anticoagulants: Secondary | ICD-10-CM | POA: Insufficient documentation

## 2024-03-15 LAB — CMP (CANCER CENTER ONLY)
ALT: 10 U/L (ref 0–44)
AST: 24 U/L (ref 15–41)
Albumin: 4.4 g/dL (ref 3.5–5.0)
Alkaline Phosphatase: 103 U/L (ref 38–126)
Anion gap: 10 (ref 5–15)
BUN: 20 mg/dL (ref 8–23)
CO2: 28 mmol/L (ref 22–32)
Calcium: 9.4 mg/dL (ref 8.9–10.3)
Chloride: 100 mmol/L (ref 98–111)
Creatinine: 0.87 mg/dL (ref 0.44–1.00)
GFR, Estimated: >60 mL/min (ref >60)
Glucose, Bld: 112 mg/dL — ABNORMAL HIGH (ref 70–99)
Potassium: 4.5 mmol/L (ref 3.5–5.1)
Sodium: 138 mmol/L (ref 135–145)
Total Bilirubin: 0.7 mg/dL (ref 0.0–1.2)
Total Protein: 6.5 g/dL (ref 6.5–8.1)

## 2024-03-15 LAB — IRON AND IRON BINDING CAPACITY (CC-WL,HP ONLY)
Iron: 86 ug/dL (ref 28–170)
Saturation Ratios: 30 % (ref 10.4–31.8)
TIBC: 284 ug/dL (ref 250–450)
UIBC: 198 ug/dL (ref 148–442)

## 2024-03-15 LAB — CBC
HCT: 38.7 % (ref 36.0–46.0)
Hemoglobin: 12.7 g/dL (ref 12.0–15.0)
MCH: 34.2 pg — ABNORMAL HIGH (ref 26.0–34.0)
MCHC: 32.8 g/dL (ref 30.0–36.0)
MCV: 104.3 fL — ABNORMAL HIGH (ref 80.0–100.0)
Platelets: 280 10*3/uL (ref 150–400)
RBC: 3.71 MIL/uL — ABNORMAL LOW (ref 3.87–5.11)
RDW: 14 % (ref 11.5–15.5)
WBC: 9.7 10*3/uL (ref 4.0–10.5)
nRBC: 0 % (ref 0.0–0.2)

## 2024-03-15 LAB — RETIC PANEL
Immature Retic Fract: 5.2 % (ref 2.3–15.9)
RBC.: 3.72 MIL/uL — ABNORMAL LOW (ref 3.87–5.11)
Retic Count, Absolute: 96.7 10*3/uL (ref 19.0–186.0)
Retic Ct Pct: 2.6 % (ref 0.4–3.1)
Reticulocyte Hemoglobin: 37 pg (ref >27.9)

## 2024-03-15 LAB — VITAMIN B12: Vitamin B-12: 401 pg/mL (ref 180–914)

## 2024-03-15 LAB — FERRITIN: Ferritin: 182 ng/mL (ref 11–307)

## 2024-03-15 NOTE — Progress Notes (Signed)
 Hematology and Oncology Follow Up Visit  Kathleen Pace [id number] 12/07/1940 83 y.o. 03/15/2024   Principle Diagnosis:  Chronic immune thrombocytopenia, remission. 2. Pernicious anemia. 3. Vitiligo. 4. Rheumatoid Arthritis 5. Iron  Deficiency Anemia 6. Pulmonary Embolism -09/10/2023  Current Therapy:   Vitamin B12 1 mg IM Q. month - she self administers- last injection given about 2 weeks ago Eliquis- managed by PCP/Cardiology  IV Iron  as needed -- Monoferric  given on 01/08/2022  Venofer  given on 11/30/2023 and 12/07/2023      Interim History:  Ms.  Pace is back for follow-up.   Today she reports that she is well. She has no concerns for our office but does state that she has an appointment this afternoon with her PCP to discuss a potential UTI. No fevers or vomiting. No severe abdominal pain.   She is back to taking her B12 injections which she administers herself.   Appetite is slightly better now that she is on the Mirtazapine .   She has had no problems with bowels or bladder.  Mild intermittent loose stools.   She is taking the Eliquis and doing well without side effects.   There has been no other bleeding to her knowledge: denies epistaxis, gingivitis, hemoptysis, hematemesis, hematuria, melena, excessive bruising, blood donation.   No abdominal pain.   There has been no palpitations.  She has had no cardiac issues.  Her last iron  studies back in March showed a ferritin of 127 with an iron  saturation of 28% s/p Venofer  on  11/30/2023 and 12/07/2023)  Currently, I would say that her performance status is probably ECOG 1. Wt Readings from Last 3 Encounters:  03/15/24 91 lb 6.4 oz (41.5 kg)  01/12/24 92 lb 1.9 oz (41.8 kg)  12/15/23 92 lb 6.4 oz (41.9 kg)   Medications:  Current Outpatient Medications:    acetaminophen  (TYLENOL ) 650 MG CR tablet, Take by mouth., Disp: , Rfl:    albuterol  (PROVENTIL  HFA;VENTOLIN  HFA) 108 (90 Base) MCG/ACT inhaler, Inhale 2  puffs into the lungs every 4 (four) hours as needed., Disp: , Rfl:    amLODipine (NORVASC) 10 MG tablet, Take 10 mg by mouth daily., Disp: , Rfl:    [START ON 03/20/2024] apixaban (ELIQUIS) 2.5 MG TABS tablet, Take 2.5 mg by mouth 2 (two) times daily., Disp: , Rfl:    aspirin 81 MG EC tablet, Take 1 tablet by mouth daily., Disp: , Rfl:    atorvastatin (LIPITOR) 80 MG tablet, SMARTSIG:1 Tablet(s) By Mouth Every Evening, Disp: , Rfl:    CALCIUM-VITAMIN D  PO, Take 1 capsule by mouth every morning. Calcium 600 mg  Vitamin D  200, Disp: , Rfl:    Cholecalciferol (VITAMIN D ) 2000 UNITS tablet, Take 2,000 Units by mouth daily., Disp: , Rfl:    cyanocobalamin  (VITAMIN B12) 1000 MCG/ML injection, INJECT 1 ML EVERY MONTH INTO YOUR MUSCLE, Disp: 3 mL, Rfl: 24   esomeprazole (NEXIUM) 20 MG capsule, Take 20 mg by mouth daily at 12 noon., Disp: , Rfl:    hydroxychloroquine (PLAQUENIL) 200 MG tablet, 200 mg daily. , Disp: , Rfl:    leflunomide (ARAVA) 10 MG tablet, Take 10 mg by mouth daily. Take 1 and 1/2 tablet total of 15 mg daily., Disp: , Rfl:    levothyroxine (SYNTHROID, LEVOTHROID) 125 MCG tablet, 125 mcg daily. 08/31/2019 One day each week, will increase to 1.5 tabs., Disp: , Rfl:    metoprolol succinate (TOPROL-XL) 25 MG 24 hr tablet, Take 25 mg by mouth daily., Disp: ,  Rfl:    ondansetron (ZOFRAN-ODT) 8 MG disintegrating tablet, Take 8 mg by mouth every 8 (eight) hours as needed., Disp: , Rfl:    sertraline (ZOLOFT) 50 MG tablet, Take 50 mg by mouth daily., Disp: , Rfl:    umeclidinium-vilanterol (ANORO ELLIPTA) 62.5-25 MCG/INH AEPB, Inhale into the lungs daily. , Disp: , Rfl:    valsartan (DIOVAN) 320 MG tablet, Take by mouth daily. , Disp: , Rfl:    mirtazapine  (REMERON ) 7.5 MG tablet, Take 1 tablet (7.5 mg total) by mouth at bedtime. (Patient not taking: Reported on 03/15/2024), Disp: 30 tablet, Rfl: 6   naloxone (NARCAN) nasal spray 4 mg/0.1 mL, Place 1 spray into the nose once. (Patient not taking:  Reported on 03/15/2024), Disp: , Rfl:   Allergies:  Allergies  Allergen Reactions   Iodinated Contrast Media Swelling   Sulfa Antibiotics Other (See Comments)    Drops Blood Pressure   Methotrexate Nausea Only and Other (See Comments)    Nose bleeds   Spironolactone Rash    Rash with itching unresponsive to benadryl .    Doxycycline Nausea Only   Penicillins Rash    Patient tolerated cefazolin on 01/10/21   Quinine Derivatives Other (See Comments)    Patient doesn't remember reaction    Past Medical History, Surgical history, Social history, and Family History were reviewed and updated.  Review of Systems: Review of Systems  Constitutional:  Positive for malaise/fatigue.  HENT:  Negative for congestion.   Eyes: Negative.   Respiratory:  Negative for cough.   Cardiovascular:  Positive for palpitations.  Gastrointestinal:  Negative for diarrhea and nausea.  Genitourinary: Negative.   Musculoskeletal:  Positive for joint pain and myalgias.  Skin: Negative.   Neurological: Negative.   Endo/Heme/Allergies: Negative.   Psychiatric/Behavioral: Negative.     Physical Exam:  height is 5\' 2"  (1.575 m) and weight is 91 lb 6.4 oz (41.5 kg). Her oral temperature is 97.8 F (36.6 C). Her blood pressure is 115/55 (abnormal) and her pulse is 57 (abnormal). Her respiration is 20 and oxygen saturation is 98%.   Physical Exam Vitals reviewed.  Constitutional:      Comments: Thin, Frail. Elderly   HENT:     Head: Normocephalic and atraumatic.  Eyes:     Pupils: Pupils are equal, round, and reactive to light.  Cardiovascular:     Rate and Rhythm: Normal rate and regular rhythm.     Heart sounds: Normal heart sounds.  Pulmonary:     Effort: Pulmonary effort is normal.     Breath sounds: Normal breath sounds.  Abdominal:     General: Bowel sounds are normal.     Palpations: Abdomen is soft.  Musculoskeletal:        General: No tenderness or deformity. Normal range of motion.      Cervical back: Normal range of motion.  Lymphadenopathy:     Cervical: No cervical adenopathy.  Skin:    General: Skin is warm and dry.     Findings: No erythema or rash.  Neurological:     Mental Status: She is alert and oriented to person, place, and time.  Psychiatric:        Behavior: Behavior normal.        Thought Content: Thought content normal.        Judgment: Judgment normal.      Lab Results  Component Value Date   WBC 9.7 03/15/2024   HGB 12.7 03/15/2024   HCT 38.7 03/15/2024  MCV 104.3 (H) 03/15/2024   PLT 280 03/15/2024     Chemistry      Component Value Date/Time   NA 138 03/15/2024 1112   NA 144 09/11/2017 1335   NA 138 07/31/2016 0947   K 4.5 03/15/2024 1112   K 4.2 09/11/2017 1335   K 4.3 07/31/2016 0947   CL 100 03/15/2024 1112   CL 104 09/11/2017 1335   CO2 28 03/15/2024 1112   CO2 28 09/11/2017 1335   CO2 27 07/31/2016 0947   BUN 20 03/15/2024 1112   BUN 17 09/11/2017 1335   BUN 14.7 07/31/2016 0947   CREATININE 0.87 03/15/2024 1112   CREATININE 0.9 09/11/2017 1335   CREATININE 0.7 07/31/2016 0947      Component Value Date/Time   CALCIUM 9.4 03/15/2024 1112   CALCIUM 9.0 09/11/2017 1335   CALCIUM 9.2 07/31/2016 0947   ALKPHOS 103 03/15/2024 1112   ALKPHOS 102 (H) 09/11/2017 1335   ALKPHOS 70 07/31/2016 0947   Pace 24 03/15/2024 1112   Pace 18 07/31/2016 0947   ALT 10 03/15/2024 1112   ALT 20 09/11/2017 1335   ALT 9 07/31/2016 0947   BILITOT 0.7 03/15/2024 1112   BILITOT 0.62 07/31/2016 0947     Encounter Diagnoses  Name Primary?   Iron  deficiency anemia due to chronic blood loss Yes   Idiopathic thrombocytopenic purpura (HCC)    Malabsorption of iron     Vitamin B12 deficiency    Pernicious anemia    Loss of appetite     Impression and Plan:  Kathleen Pace is 83 year old white female. She has clear autoimmune issues. She has pernicious anemia. She has vitiligo. She has hypothyroidism.  She is on Plaquenil for rheumatism.  ITP  continues to be well controlled with a normal platelet count of 280 Weight is down one pound-She will continue her Mirtazapine    Today her Hgb is 12.7.MCV is stable at 104.3 Iron  studies pending. Will replace if needed  She will continue her home B12 injections   RTC 2 months MD, labs (CBC, CMP, B12, iron , ferritin)   Sharla Davis, PA-C 5/27/20252:37 PM

## 2024-03-16 ENCOUNTER — Ambulatory Visit: Payer: Self-pay | Admitting: Medical Oncology

## 2024-03-16 NOTE — Telephone Encounter (Signed)
-----   Message from Kathleen Pace sent at 03/16/2024  2:38 PM EDT ----- No adjustments needed in her plan at this time. Continue her B12 at home once monthly

## 2024-03-16 NOTE — Telephone Encounter (Signed)
Called and informed patient of lab results, patient verbalized understanding and denies any questions or concerns at this time.   

## 2024-05-13 ENCOUNTER — Other Ambulatory Visit: Payer: Self-pay

## 2024-05-13 DIAGNOSIS — D500 Iron deficiency anemia secondary to blood loss (chronic): Secondary | ICD-10-CM

## 2024-05-13 DIAGNOSIS — D693 Immune thrombocytopenic purpura: Secondary | ICD-10-CM

## 2024-05-13 DIAGNOSIS — K909 Intestinal malabsorption, unspecified: Secondary | ICD-10-CM

## 2024-05-13 DIAGNOSIS — D510 Vitamin B12 deficiency anemia due to intrinsic factor deficiency: Secondary | ICD-10-CM

## 2024-05-13 DIAGNOSIS — E538 Deficiency of other specified B group vitamins: Secondary | ICD-10-CM

## 2024-05-16 ENCOUNTER — Inpatient Hospital Stay: Admitting: Hematology & Oncology

## 2024-05-16 ENCOUNTER — Encounter: Payer: Self-pay | Admitting: Hematology & Oncology

## 2024-05-16 ENCOUNTER — Inpatient Hospital Stay: Attending: Medical Oncology

## 2024-05-16 VITALS — BP 109/52 | HR 64 | Temp 97.5°F | Resp 19 | Ht 62.0 in | Wt 88.0 lb

## 2024-05-16 DIAGNOSIS — D510 Vitamin B12 deficiency anemia due to intrinsic factor deficiency: Secondary | ICD-10-CM | POA: Insufficient documentation

## 2024-05-16 DIAGNOSIS — Z7901 Long term (current) use of anticoagulants: Secondary | ICD-10-CM | POA: Diagnosis not present

## 2024-05-16 DIAGNOSIS — Z79899 Other long term (current) drug therapy: Secondary | ICD-10-CM | POA: Diagnosis not present

## 2024-05-16 DIAGNOSIS — Z79890 Hormone replacement therapy (postmenopausal): Secondary | ICD-10-CM | POA: Diagnosis not present

## 2024-05-16 DIAGNOSIS — Z7969 Long term (current) use of other immunomodulators and immunosuppressants: Secondary | ICD-10-CM | POA: Diagnosis not present

## 2024-05-16 DIAGNOSIS — M069 Rheumatoid arthritis, unspecified: Secondary | ICD-10-CM | POA: Diagnosis not present

## 2024-05-16 DIAGNOSIS — E538 Deficiency of other specified B group vitamins: Secondary | ICD-10-CM

## 2024-05-16 DIAGNOSIS — D500 Iron deficiency anemia secondary to blood loss (chronic): Secondary | ICD-10-CM | POA: Diagnosis present

## 2024-05-16 DIAGNOSIS — Z7982 Long term (current) use of aspirin: Secondary | ICD-10-CM | POA: Insufficient documentation

## 2024-05-16 DIAGNOSIS — E039 Hypothyroidism, unspecified: Secondary | ICD-10-CM | POA: Insufficient documentation

## 2024-05-16 DIAGNOSIS — D693 Immune thrombocytopenic purpura: Secondary | ICD-10-CM

## 2024-05-16 DIAGNOSIS — L80 Vitiligo: Secondary | ICD-10-CM | POA: Diagnosis not present

## 2024-05-16 DIAGNOSIS — K909 Intestinal malabsorption, unspecified: Secondary | ICD-10-CM

## 2024-05-16 DIAGNOSIS — R634 Abnormal weight loss: Secondary | ICD-10-CM | POA: Insufficient documentation

## 2024-05-16 DIAGNOSIS — I2699 Other pulmonary embolism without acute cor pulmonale: Secondary | ICD-10-CM | POA: Insufficient documentation

## 2024-05-16 LAB — CMP (CANCER CENTER ONLY)
ALT: 24 U/L (ref 0–44)
AST: 45 U/L — ABNORMAL HIGH (ref 15–41)
Albumin: 4.5 g/dL (ref 3.5–5.0)
Alkaline Phosphatase: 115 U/L (ref 38–126)
Anion gap: 13 (ref 5–15)
BUN: 16 mg/dL (ref 8–23)
CO2: 26 mmol/L (ref 22–32)
Calcium: 10.1 mg/dL (ref 8.9–10.3)
Chloride: 102 mmol/L (ref 98–111)
Creatinine: 0.87 mg/dL (ref 0.44–1.00)
GFR, Estimated: >60 mL/min (ref >60)
Glucose, Bld: 132 mg/dL — ABNORMAL HIGH (ref 70–99)
Potassium: 5.2 mmol/L — ABNORMAL HIGH (ref 3.5–5.1)
Sodium: 141 mmol/L (ref 135–145)
Total Bilirubin: 0.6 mg/dL (ref 0.0–1.2)
Total Protein: 7.1 g/dL (ref 6.5–8.1)

## 2024-05-16 LAB — IRON AND IRON BINDING CAPACITY (CC-WL,HP ONLY)
Iron: 88 ug/dL (ref 28–170)
Saturation Ratios: 27 % (ref 10.4–31.8)
TIBC: 332 ug/dL (ref 250–450)
UIBC: 244 ug/dL

## 2024-05-16 LAB — RETIC PANEL
Immature Retic Fract: 3.8 % (ref 2.3–15.9)
RBC.: 3.88 MIL/uL (ref 3.87–5.11)
Retic Count, Absolute: 83.4 K/uL (ref 19.0–186.0)
Retic Ct Pct: 2.2 % (ref 0.4–3.1)
Reticulocyte Hemoglobin: 35.9 pg (ref >27.9)

## 2024-05-16 LAB — CBC
HCT: 40.8 % (ref 36.0–46.0)
Hemoglobin: 13.3 g/dL (ref 12.0–15.0)
MCH: 34.5 pg — ABNORMAL HIGH (ref 26.0–34.0)
MCHC: 32.6 g/dL (ref 30.0–36.0)
MCV: 106 fL — ABNORMAL HIGH (ref 80.0–100.0)
Platelets: 254 K/uL (ref 150–400)
RBC: 3.85 MIL/uL — ABNORMAL LOW (ref 3.87–5.11)
RDW: 12.9 % (ref 11.5–15.5)
WBC: 5.8 K/uL (ref 4.0–10.5)
nRBC: 0 % (ref 0.0–0.2)

## 2024-05-16 LAB — VITAMIN B12: Vitamin B-12: 505 pg/mL (ref 180–914)

## 2024-05-16 LAB — FERRITIN: Ferritin: 60 ng/mL (ref 11–307)

## 2024-05-16 MED ORDER — MEGESTROL ACETATE 400 MG/10ML PO SUSP: 400.0000 mg | Freq: Two times a day (BID) | ORAL | 4 refills | Status: DC

## 2024-05-16 NOTE — Progress Notes (Signed)
 Hematology and Oncology Follow Up Visit  Kathleen Pace 26-Dec-1940 83 y.o. 05/16/2024   Principle Diagnosis:  Chronic immune thrombocytopenia, remission. 2. Pernicious anemia. 3. Vitiligo. 4. Rheumatoid Arthritis 5. Iron  Deficiency Anemia 6. Pulmonary Embolism -09/10/2023  Current Therapy:   Vitamin B12 1 mg IM Q. month - she self administers- last injection given about 2 weeks ago Eliquis- managed by PCP/Cardiology  IV Iron  as needed -- Monoferric  given on 01/08/2022  Venofer  given on 11/30/2023 and 12/07/2023      Interim History:  Ms.  Pace is back for follow-up.  Her main problem clearly is a weight loss.  Her weight is now down to 88 pounds.  I am still unsure as to why the weight is down so much.  She is eating.  She is trying to intaking more protein.  She does have issues with a pulmonary embolism.  She was found to have 1 back in November of last year.  She is on Eliquis for this.  She has been given Remeron  to try to help with her appetite.  I suppose we could always try her on Megace  elixir although she has had the pulmonary embolism.  Regardless, she is on blood thinner so the Megace  may help.  It may not be a bad idea to get a CT scan on her.  She does wear oxygen.  She does not have any on today.  She mostly wears this at nighttime.  I think this was secondary to the pulmonary embolism that she had.  She also has rheumatoid arthritis.  I suppose that this could also be a risk factor for her having weight loss.  There is been no fever.  She has had no bleeding.  There is been no change in bowel or bladder habits.  She has had no rashes.  There has been no leg swelling.  She has had no cough.  Overall, I would say that her performance status is probably ECOG 3.   Wt Readings from Last 3 Encounters:  05/16/24 88 lb (39.9 kg)  03/15/24 91 lb 6.4 oz (41.5 kg)  01/12/24 92 lb 1.9 oz (41.8 kg)   Medications:  Current Outpatient Medications:     acetaminophen  (TYLENOL ) 650 MG CR tablet, Take by mouth., Disp: , Rfl:    albuterol  (PROVENTIL  HFA;VENTOLIN  HFA) 108 (90 Base) MCG/ACT inhaler, Inhale 2 puffs into the lungs every 4 (four) hours as needed., Disp: , Rfl:    amLODipine (NORVASC) 10 MG tablet, Take 10 mg by mouth daily., Disp: , Rfl:    apixaban (ELIQUIS) 2.5 MG TABS tablet, Take 2.5 mg by mouth 2 (two) times daily., Disp: , Rfl:    aspirin 81 MG EC tablet, Take 1 tablet by mouth daily., Disp: , Rfl:    atorvastatin (LIPITOR) 80 MG tablet, SMARTSIG:1 Tablet(s) By Mouth Every Evening, Disp: , Rfl:    CALCIUM-VITAMIN D  PO, Take 1 capsule by mouth every morning. Calcium 600 mg  Vitamin D  200, Disp: , Rfl:    Cholecalciferol (VITAMIN D ) 2000 UNITS tablet, Take 2,000 Units by mouth daily., Disp: , Rfl:    cyanocobalamin  (VITAMIN B12) 1000 MCG/ML injection, INJECT 1 ML EVERY MONTH INTO YOUR MUSCLE, Disp: 3 mL, Rfl: 24   esomeprazole (NEXIUM) 20 MG capsule, Take 20 mg by mouth daily at 12 noon., Disp: , Rfl:    hydroxychloroquine (PLAQUENIL) 200 MG tablet, 200 mg daily. , Disp: , Rfl:    leflunomide (ARAVA) 10 MG tablet, Take 10 mg  by mouth daily. Take 1 and 1/2 tablet total of 15 mg daily., Disp: , Rfl:    levothyroxine (SYNTHROID, LEVOTHROID) 125 MCG tablet, 125 mcg daily. 08/31/2019 One day each week, will increase to 1.5 tabs., Disp: , Rfl:    metoprolol succinate (TOPROL-XL) 25 MG 24 hr tablet, Take 25 mg by mouth daily., Disp: , Rfl:    ondansetron (ZOFRAN-ODT) 8 MG disintegrating tablet, Take 8 mg by mouth every 8 (eight) hours as needed., Disp: , Rfl:    sertraline (ZOLOFT) 50 MG tablet, Take 50 mg by mouth daily., Disp: , Rfl:    umeclidinium-vilanterol (ANORO ELLIPTA) 62.5-25 MCG/INH AEPB, Inhale into the lungs daily. , Disp: , Rfl:    valsartan (DIOVAN) 320 MG tablet, Take by mouth daily. , Disp: , Rfl:    mirtazapine  (REMERON ) 7.5 MG tablet, Take 1 tablet (7.5 mg total) by mouth at bedtime. (Patient not taking: Reported on  05/16/2024), Disp: 30 tablet, Rfl: 6   naloxone (NARCAN) nasal spray 4 mg/0.1 mL, Place 1 spray into the nose once. (Patient not taking: Reported on 05/16/2024), Disp: , Rfl:   Allergies:  Allergies  Allergen Reactions   Iodinated Contrast Media Swelling   Sulfa Antibiotics Other (See Comments)    Drops Blood Pressure   Methotrexate Nausea Only and Other (See Comments)    Nose bleeds   Spironolactone Rash    Rash with itching unresponsive to benadryl .    Doxycycline Nausea Only   Penicillins Rash    Patient tolerated cefazolin on 01/10/21   Quinine Derivatives Other (See Comments)    Patient doesn't remember reaction    Past Medical History, Surgical history, Social history, and Family History were reviewed and updated.  Review of Systems: Review of Systems  Constitutional:  Positive for malaise/fatigue.  HENT:  Negative for congestion.   Eyes: Negative.   Respiratory:  Negative for cough.   Cardiovascular:  Positive for palpitations.  Gastrointestinal:  Negative for diarrhea and nausea.  Genitourinary: Negative.   Musculoskeletal:  Positive for joint pain and myalgias.  Skin: Negative.   Neurological: Negative.   Endo/Heme/Allergies: Negative.   Psychiatric/Behavioral: Negative.     Physical Exam:  height is 5' 2 (1.575 m) and weight is 88 lb (39.9 kg). Her oral temperature is 97.5 F (36.4 C) (abnormal). Her blood pressure is 109/52 (abnormal) and her pulse is 64. Her respiration is 19 and oxygen saturation is 98%.   Physical Exam Vitals reviewed.  Constitutional:      Comments: Thin, Frail. Elderly   HENT:     Head: Normocephalic and atraumatic.  Eyes:     Pupils: Pupils are equal, round, and reactive to light.  Cardiovascular:     Rate and Rhythm: Normal rate and regular rhythm.     Heart sounds: Normal heart sounds.  Pulmonary:     Effort: Pulmonary effort is normal.     Breath sounds: Normal breath sounds.  Abdominal:     General: Bowel sounds are normal.      Palpations: Abdomen is soft.  Musculoskeletal:        General: No tenderness or deformity. Normal range of motion.     Cervical back: Normal range of motion.  Lymphadenopathy:     Cervical: No cervical adenopathy.  Skin:    General: Skin is warm and dry.     Findings: No erythema or rash.  Neurological:     Mental Status: She is alert and oriented to person, place, and time.  Psychiatric:  Behavior: Behavior normal.        Thought Content: Thought content normal.        Judgment: Judgment normal.      Lab Results  Component Value Date   WBC 5.8 05/16/2024   HGB 13.3 05/16/2024   HCT 40.8 05/16/2024   MCV 106.0 (H) 05/16/2024   PLT 254 05/16/2024     Chemistry      Component Value Date/Time   NA 141 05/16/2024 1143   NA 144 09/11/2017 1335   NA 138 07/31/2016 0947   K 5.2 (H) 05/16/2024 1143   K 4.2 09/11/2017 1335   K 4.3 07/31/2016 0947   CL 102 05/16/2024 1143   CL 104 09/11/2017 1335   CO2 26 05/16/2024 1143   CO2 28 09/11/2017 1335   CO2 27 07/31/2016 0947   BUN 16 05/16/2024 1143   BUN 17 09/11/2017 1335   BUN 14.7 07/31/2016 0947   CREATININE 0.87 05/16/2024 1143   CREATININE 0.9 09/11/2017 1335   CREATININE 0.7 07/31/2016 0947      Component Value Date/Time   CALCIUM 10.1 05/16/2024 1143   CALCIUM 9.0 09/11/2017 1335   CALCIUM 9.2 07/31/2016 0947   ALKPHOS 115 05/16/2024 1143   ALKPHOS 102 (H) 09/11/2017 1335   ALKPHOS 70 07/31/2016 0947   AST 45 (H) 05/16/2024 1143   AST 18 07/31/2016 0947   ALT 24 05/16/2024 1143   ALT 20 09/11/2017 1335   ALT 9 07/31/2016 0947   BILITOT 0.6 05/16/2024 1143   BILITOT 0.62 07/31/2016 0947      Impression and Plan:  Kathleen Pace is 83 year old white female. She has clear autoimmune issues. She has pernicious anemia. She has vitiligo. She has hypothyroidism.  She is on Plaquenil for rheumatism.  Her biggest problem clearly is the weight loss.  Again I am not sure as to why she has this weight  loss.  It might be from her chronic medical problems.  I do not think that anything that we do here with her would be an issue.  I do think that a CT scan would not be a bad idea for her.  Again I will try her on some Megace  elixir.  Will have to follow her closely.  I will have to see her back in her back maybe in 6 weeks.   Kathleen JONELLE Crease, MD 7/28/202512:53 PM

## 2024-05-19 ENCOUNTER — Ambulatory Visit (HOSPITAL_BASED_OUTPATIENT_CLINIC_OR_DEPARTMENT_OTHER)
Admission: RE | Admit: 2024-05-19 | Discharge: 2024-05-19 | Disposition: A | Discharge status: Home / Self Care | Source: Ambulatory Visit | Attending: Hematology & Oncology | Admitting: Hematology & Oncology

## 2024-05-19 DIAGNOSIS — J439 Emphysema, unspecified: Secondary | ICD-10-CM | POA: Insufficient documentation

## 2024-05-19 DIAGNOSIS — R634 Abnormal weight loss: Secondary | ICD-10-CM | POA: Diagnosis present

## 2024-05-19 DIAGNOSIS — I700 Atherosclerosis of aorta: Secondary | ICD-10-CM | POA: Diagnosis not present

## 2024-05-19 DIAGNOSIS — D693 Immune thrombocytopenic purpura: Secondary | ICD-10-CM | POA: Insufficient documentation

## 2024-05-19 DIAGNOSIS — M40204 Unspecified kyphosis, thoracic region: Secondary | ICD-10-CM | POA: Diagnosis not present

## 2024-05-19 DIAGNOSIS — I2510 Atherosclerotic heart disease of native coronary artery without angina pectoris: Secondary | ICD-10-CM | POA: Insufficient documentation

## 2024-05-28 ENCOUNTER — Ambulatory Visit: Payer: Self-pay | Admitting: Hematology & Oncology

## 2024-05-31 ENCOUNTER — Encounter: Payer: Self-pay | Admitting: Hematology & Oncology

## 2024-05-31 NOTE — Telephone Encounter (Signed)
 LM for patient to call back regarding results.

## 2024-05-31 NOTE — Telephone Encounter (Signed)
-----   Message from Maude JONELLE Crease sent at 05/28/2024  1:39 PM EDT ----- Call - the CT scan does not show any obvious cancer!!!!  Jeralyn ----- Message ----- From: Rebecka, Rad Results In Sent: 05/28/2024   5:47 AM EDT To: Maude JONELLE Crease, MD

## 2024-05-31 NOTE — Telephone Encounter (Addendum)
 Third attempt made to contact patient to discuss CT results. Unable to reach patient.

## 2024-06-06 ENCOUNTER — Telehealth: Payer: Self-pay

## 2024-06-06 NOTE — Telephone Encounter (Signed)
 Received phone call from patient stating she was calling for results.  Pt states her husband passed away unexpectedly. Pt given the following results: The CT scan does not show any obvious cancer!!!!  Pt given condolences for recent loss. Pt verbalized understanding of results and had no further questions.

## 2024-06-25 ENCOUNTER — Other Ambulatory Visit: Payer: Self-pay | Admitting: Hematology & Oncology

## 2024-06-25 DIAGNOSIS — D510 Vitamin B12 deficiency anemia due to intrinsic factor deficiency: Secondary | ICD-10-CM

## 2024-06-30 ENCOUNTER — Inpatient Hospital Stay

## 2024-06-30 ENCOUNTER — Inpatient Hospital Stay: Admitting: Hematology & Oncology

## 2024-06-30 NOTE — Discharge Summary (Signed)
 Hospital Medicine Discharge Summary   Demographics: Kathleen Pace  83 y.o. 26-Nov-1940 MRN: [id number]    Extended Emergency Contact Information Primary Emergency Contact: Palacios,Lisa Mobile Phone: [phone] Relation: Daughter  DNAR per Portable DNAR  Admit Date: 06/24/2024                            Attending Physician: No att. providers found Discharge Date: 06/30/2024  Primary Care Provider: [name], MD   [phone]  Consults during this admission: Consult Orders             IP CONSULT TO HEMATOLOGY       Specialty:  Hematology  Provider:  (Not yet assigned)      IP CONSULT TO HOSPITALIST       Provider:  (Not yet assigned)              Active & Resolved Diagnosis: Principal Problem:   COPD exacerbation    (CMD) Active Problems:   Pulmonary emphysema (HCC)   Essential hypertension   Acquired hypothyroidism   Gastroesophageal reflux disease without esophagitis   Hyperlipidemia LDL goal <70   A-fib    (CMD)   Goals of care, counseling/discussion Resolved Problems:   * No resolved hospital problems. *   Disposition: Patient discharged to Other: Home with hospice in stable condition.     Scheduled Future Appointments       Provider Department Dept Phone Center   07/05/2024 11:00 AM [facility] [facility]  - Pulmonary [place] [phone] [facility] [place]   07/06/2024 2:40 PM Baker Adela Bare [facility]  - Chronic Complex Care [place] [phone] [facility] [place]   02/27/2025 11:30 AM Alverna Lamar Sieving [facility]  - Cardiology [place] [phone] [facility] 306 [facility] Course:  Kathleen Pace is a 83 y.o. female with hx of RA, COPD on 2 L chronically, PE on Eliquis, HFpEF, hypothyroidism, GERD, chronic immune thrombocytopenia, pernicious anemia presented to [facility] ED on 06/25/2024 secondary to shortness of breath.  Patient had been  having worsening weakness and worsening shortness of breath. Patient recently completed course of Levaquin, she was noted to have COPD exacerbation, she was given prednisone  40 mg daily.  Patient was also on 7 days course of prednisone  and was still short of breath.  Patient has chronic ITP.  Patient was very dyspneic on limiting exertion, needing up to 4 L during ambulation.  Patient had some epistaxis likely secondary to dry nose, which eventually resolved.  CT of the chest was done which was negative for PE, procalcitonin was negative, was noted to have a pulmonary nodule which patient says she is aware of and advanced emphysema.  I believe patient has severe COPD which is worsening.  She does have very poor lung reserve.  Patient's daughter, Kathleen Pace states that she has been declining since her husband's demise and multiple death in family recently. Goals of care were discussed, patient and patient's daughter wanted to discuss with hospice.  Patient is being discharged home with hospice, with focus on comfort. She was sitting up in the chair today, was excited to be discharged home.  Denied any complaints concerns.  All medications have been resumed prior to discharge.   Wound / Incision Assessment: Refer to Chart Review and Media Tab for images if available.      Temp:  [98.1 F (  36.7 C)-98.4 F (36.9 C)] 98.1 F (36.7 C) Heart Rate:  [68-84] 73 Resp:  [15-17] 15 BP: (131-165)/(59-75) 150/70  Gen- awake, alert, frail-appearing, not in acute distress  Head-  atraumatic, normocephalic  ENT- normal oral  mucosa Respiratory- B/l diminished breath sounds all over, no wheezing, no crackle  Dyspneic when she walks with a walker. CVS- s1s2+ regular  GI- soft, BS+, non tender in all quadrants Neuro- awake, alert, no FND Skin- normal turgor Psych- normal mood  Ext- no pedal edema   Anticoagulant Medications     Direct Factor Xa Inhibitors Start End   * apixaban (Eliquis) 2.5 mg tab 03/20/2024 --    Sig - Route: Take 1 tablet (2.5 mg total) by mouth 2 (two) times a day. - oral   Notes to Pharmacy: Start 2.5 mg twice daily in June.   Cosign for Ordering: Accepted by [name], MD on 02/19/2024 11:41 AM         Discharge Medications     Modified Medications      Sig Disp Refill Start End  * levothyroxine 125 mcg tablet Commonly known as: SYNTHROID What changed: Another medication with the same name was changed. Make sure you understand how and when to take each.  Take 0.5 tablets by mouth 2 (two) times a week. Patient is taking 125 mcg Mon-Fri and 62.5 mcg on Sat and Sun.   0     * levothyroxine 125 mcg tablet Commonly known as: SYNTHROID What changed:  how much to take how to take this when to take this additional instructions  TAKE 1 TABLET 4 DAYS A WEEK AND 1 AND 1/2 TABLET 3 DAYS A WEEK  135 tablet  2        * * There are duplicate medications prescribed to the patient          Medications To Continue      Sig Disp Refill Start End  acetaminophen  650 mg ER tablet Commonly known as: TYLENOL   Take 650 mg by mouth as needed in the morning and 650 mg as needed in the evening.   0     albuterol  HFA 90 mcg/actuation inhaler Commonly known as: PROVENTIL  HFA;VENTOLIN  HFA;PROAIR  HFA  Inhale 2 puffs every 4 (four) hours as needed for shortness of breath.  18 g  11     amLODIPine 10 mg tablet Commonly known as: NORVASC  TAKE 1 TABLET BY MOUTH EVERY DAY  90 tablet  3     Anoro Ellipta 62.5-25 mcg/actuation Dsdv Generic drug: umeclidinium-vilanteroL  Inhale 1 puff daily.  60 each  5     aspirin 81 mg EC tablet  Take 81 mg by mouth Once Daily.  30 tablet  0     atorvastatin 80 mg tablet Commonly known as: LIPITOR  TAKE 1 TABLET BY MOUTH DAILY AT 6 PM.  90 tablet  3     calcium carbonate 1500 mg (600 mg calcium) tablet Commonly known as: OS-CAL  Take 1 tablet by mouth daily.   0     cholecalciferol 2,000 unit tablet Commonly known as:  VITAMIN D3  Take 2,000 Units by mouth daily.   0     cyanocobalamin  1,000 mcg/mL injection Commonly known as: VITAMIN B12  Inject 1,000 mcg into the muscle every 30 (thirty) days.   0     diclofenac sodium 1 % gel Commonly known as: VOLTAREN  Apply 2 g topically 2 (two) times a day as needed (back  pain). AS DIRECTED  100 g  1     Eliquis 2.5 mg Tab Generic drug: apixaban  Take 1 tablet (2.5 mg total) by mouth 2 (two) times a day.  180 tablet  1     esomeprazole 20 mg DR capsule Commonly known as: NexIUM  Take 20 mg by mouth daily before breakfast.   0     hydroxychloroquine 200 mg tablet Commonly known as: PLAQUENIL  Take 200 mg by mouth nightly.   0     leflunomide 10 mg tablet Commonly known as: ARAVA  Take 10 mg by mouth daily.   3     megestroL  400 mg/10 mL (40 mg/mL) suspension Commonly known as: MEGACE   Take 10 mL by mouth in the morning and 10 mL in the evening.   0     metoprolol succinate 25 mg 24 hr tablet Commonly known as: TOPROL XL  Take 1 tablet (25 mg total) by mouth daily.  90 tablet  2     mirtazapine  7.5 mg tablet Commonly known as: REMERON   Take 7.5 mg by mouth at bedtime.   0     naloxone 4 mg/actuation Spry nasal spray Commonly known as: NARCAN  Administer 1 spray into affected nostril(s) as needed (opiod overdose).  2 each  0     ondansetron 8 mg disintegrating tablet Commonly known as: ZOFRAN-ODT  Dissolve 1 tablet (8 mg total) on tongue every 8 (eight) hours as needed for nausea.  20 tablet  0     Restasis 0.05 % ophthalmic emulsion Generic drug: cycloSPORINE  Administer 1 drop into both eyes 2 (two) times a day.   0     sertraline 50 mg tablet Commonly known as: ZOLOFT  TAKE 1 TABLET BY MOUTH EVERY DAY  90 tablet  1     solifenacin 10 mg tablet Commonly known as: VESICARE  TAKE 1 TABLET BY MOUTH EVERY DAY  90 tablet  1     tiZANidine 2 mg tablet Commonly known as: ZANAFLEX  Take 1 tablet (2 mg total) by mouth  every 8 (eight) hours as needed for muscle spasms.  15 tablet  0     valsartan 320 mg tablet Commonly known as: DIOVAN  TAKE 1 TABLET BY MOUTH EVERY DAY  90 tablet  3         Discharge Orders     DNAR per Portable DNAR     Lifting Limits:     Details:    Lifting Limits: No lifting limits   Occupational Therapy Home Health Coordination     Details:    Actions:  Evaluate and Treat Home Safety Evaluation     Physical Therapy Home Health Coordination     Details:    Actions:  Evaluate and Treat Home Safety Evaluation     Return to previous diet     Details:    Diet type: Return to previous diet   Walker     Details:    Height (in inches): 1.549 m (5' 1)   Weight (in lbs.): 43.1 kg (95 lb)   Walker Options:  Pension scheme manager or Youth     Length of need: Lifetime  Additional instructions:   This patient is currently using a home medication that needs to be returned to them prior to discharge.         Lab Results  Component Value Date/Time   HGB 11.1 (L) 06/30/2024 01:15 AM   HCT 31.6 (L) 06/30/2024 01:15  AM   WBC 9.90 06/30/2024 01:15 AM   PLT 91 (L) 06/30/2024 01:15 AM   Lab Results  Component Value Date/Time   NA 137 06/30/2024 01:15 AM   K 4.0 06/30/2024 01:15 AM   CREATININE 0.62 06/30/2024 01:15 AM   BUN 24 06/30/2024 01:15 AM   GLUCOSE 116 (H) 06/30/2024 01:15 AM    Pertinent Imaging: XR Chest 1 View  Final Result by Karlton Marilou Ferron, MD (09/05 1651)  XR CHEST 1 VIEW, 06/24/2024 4:47 PM    INDICATION:Fever   COMPARISON: CT 09/10/2023    FINDINGS:     Supportive devices: None  Cardiovascular/lungs/pleura: Cardiac silhouette and pulmonary vasculature   are within normal limits. Emphysematous lung.  Other: No interval osseous changes.    IMPRESSION:  No focal consolidation.          Electronically signed by: Wilnette Overman, MD 06/30/2024 3:51 PM   Time spent on discharge: 35  minutes *Some images could not be shown.

## 2024-07-05 NOTE — Progress Notes (Signed)
 [facility] at [facility] Care Subjective:    Patient ID: Kathleen Pace is a 83 y.o. female.  DOA:06/24/2024 DOD: 06/30/2024 No data recorded Institution: [facility] Discharge Diagnosis: COPD exacerbation Discharge Summary Available: Yes Primary Care Provider on Record: Kathleen CHRISTELLA Amos, MD  Hospital Discharge Summary and HPI:   Kathleen Pace for a post hospital visit.  She is accompanied by her daughter. She has chronic medical conditions significant for COPD/emphysema, acute on chronic hypoxic respiratory insufficiency, history of PE Nov. 2024 on Eliquis, CAD with history of NSTEMI s/p cardiac stents December 2021, aortic valve stenosis s/p TAVR 01/10/2021, CHF with HFpEF, hypothyroid, thrombocytopenia, hypertension, dyslipidemia, osteoporosis, rheumatoid arthritis and anxiety/depressive disorder.  She presented to ED on 06/24/2024 with shortness of breath.  She was hospitalized with acute COPD exacerbation.  Since discharge she has not had any subsequent ER visits or readmissions.  History of Present Illness She recently had a hospital stay due to COPD exacerbation and she has already had a follow-up visit with her Pulmonologist.  Her maintenance inhaler was switched from Anoro to Trelegy and she was provided with 2 samples of this inhaler to trial. She is unsure which inhaler she prefers as she has only used one dose of the new one.   Her daughter reports that her blood pressure has been consistently low even prior to her most recent hospitalization.  She is currently on Amlodipine 10 mg, Metoprolol XL 25 mg, and Valsartan 320 mg.  She is on Eliquis 2.5 mg daily for history of PE.    She is currently on Plaquenil 200 mg and Arava 10 mg daily for rheumatoid arthritis. Daughter states that she missed her most recent Rheumatology appointment due to  hospitalization and plans to reschedule.  She is also on levothyroxine 125 mcg for hypothyroid.  She is taking Megace  10 mg for appetite support. She takes Sertraline 50 mg  daily for anxiety/depression. She is also prescribed mirtazapine  7.5 mg at bedtime for mood and appetite reports however Pace she reports that she self discontinued this medication due to adverse effects.  We we will discontinue this from her medication list.    Review of Systems  Constitutional:  Positive for fatigue. Negative for chills and fever.  HENT:  Positive for hearing loss and nosebleeds (Episodic nosebleeds that resolved without intervention.  Patient is on continuous O2 per nasal cannula.).   Eyes:  Negative for discharge and redness.  Respiratory:  Positive for shortness of breath. Negative for cough and wheezing.   Cardiovascular:  Positive for leg swelling. Negative for chest pain.  Gastrointestinal:  Negative for abdominal distention, abdominal pain, constipation, diarrhea, nausea and vomiting.  Genitourinary:  Negative for dysuria and hematuria.  Musculoskeletal:  Positive for arthralgias and myalgias.  Skin: Negative.   Neurological:  Positive for weakness. Negative for dizziness and light-headedness.  Psychiatric/Behavioral:  The patient is nervous/anxious.     Relevant Hospitalization Related Diagnostics   No results displayed because visit has over 200 results.       CT Angio Chest Pulmonary Embolism Narrative: CT ANGIO CHEST PULMONARY EMBOLISM, 06/29/2024 9:58 AM  INDICATION: worsening dyspnea  COMPARISON: CTA PE study 09/10/2023  TECHNIQUE: Iodinated contrast was administered intravenously by rapid injection, with further multislice axial sections acquired in the pulmonary arterial phase from the thoracic inlet to the upper abdomen. Coronal maximal intensity projection images were created  for comprehensive analysis and diagnosis of the regional circulation.  All CT scans at [facility] are performed using radiation dose optimization techniques as appropriate to a performed exam, including but not limited to one or more of the following: automatic exposure control, adjustment of the mA and/or kV according to patient size, use of iterative reconstruction technique. In addition, our institution participates in a radiation dose monitoring program to optimize patient radiation exposure.  FINDINGS:   Pulmonary arteries: No pulmonary emboli identified.  Aorta/great vessels: No aneurysm. Status post TAVR. Similar caliber of the thoracic aorta with scattered calcifications. Similar diminutive caliber of the left vertebral artery.  Thoracic inlet/central airways: Thyroid  normal. Airway patent.  Mediastinum/hila/axilla: No adenopathy. Small hiatal hernia. Mild wall thickening of the lower esophagus  Heart: Normal heart size. No pericardial effusion. Coronary artery calcifications and stenting.  Lungs/pleura: Background of advanced destructive centrilobular emphysema. Similar right apical pleural parenchymal scarring. Additional linear scarring in the right lower lobe. N new left upper lobe 4 mm nodule (series 6 image 24).SABRA  Upper abdomen: Surgical changes about the posterior aspect of the stomach..  Chest wall/MSK: No acute osseous abnormality. Similar compression deformities of the midthoracic spine. Polyarticular degenerative changes. Multiple remote left-sided rib fractures. Impression: 1.  No acute pulmonary thromboembolic disease. 2.  New left upper lobe 4 mm nodule. 3.  Similar background of advanced destructive centrilobular emphysema.  Pulmonary nodule recommendation: No follow-up needed if patient is low-risk. Non-contrast chest CT can be considered in 12 months if patient is high-risk (Per Fleischner Society guidelines).     Assessment and Plan      We reviewed diagnosis, details of hospitalization and treatment plan  with patient and daughter. Hospital records were reviewed, including progress notes, consultations and diagnostics. Medications were also reconciled using hospital records.  Post Acute Needs: Pulmonology follow-up  Discussion and Medical Decision Making:   1. COPD exacerbation    (CMD) (Primary) - Patient was recently hospitalized from 06/24/2024 to 06/30/2024 with COPD exacerbation. - Chronic medical conditions significant for chronic hypoxic respiratory insufficiency with continuous O2 per nasal cannula and history of PE November 2024 currently on Eliquis 2.5 mg twice daily. - Chest CTA 06/29/2024 showed new left upper lobe 4 mm nodule. Similar background of advanced destructive centrilobular emphysema but no evidence of acute pulmonary embolism. - Continue Albuterol  HFA prn.  Maintenance inhaler was switched from Anoro to Trelegy for trial per Pulmonology.   - Patient follows with Pulmonology, last seen 07/05/2024.  2. Essential hypertension - BP 113/63, HR 74 in clinic Pace. - Labs 06/30/2024: Creatinine 0.62, eGFR 89. - Recommend monitor home BP and keep BP log.  Take log to next scheduled PCP visit for review. - Decrease Amlodipine from 10 mg to 5 mg daily. - Continue Metoprolol XL 25 mg daily and Valsartan 320 mg daily.    Monitoring and Navigation:  Patient has follow-up visits with hematology 07/13/2024, pulmonology 08/16/2024 and cardiology 02/27/2025.   Patient will need a follow-up visit with PCP within 1 month.  Follow-up: Patient will return to PCP for next scheduled appointment. Follow up with TCM clinic for any worsening symptoms.     Orders Placed This Encounter  Medications  . amLODIPine (NORVASC) 10 mg tablet    Sig: Take 0.5 tablets (5 mg total) by mouth daily.    Medication List and Pharmacology   Hospital medications reconciled with outpatient medications. Clinical Pharmacist involved with appointment Pace, discussion follows:  Chart reviewed. No changes were  made to medications at discharge.    Saw pulmonology Pace and was prescribed Trelegy (given samples) to replace Anoro. Plan to continue if tolerated well- recommend [facility] to [facility] as she has active [facility].   Allergies  Allergen Reactions  . Iodinated Contrast Media Swelling  . Sulfa (Sulfonamide Antibiotics) Hypotension and Other (See Comments)    Drops Blood Pressure  . Methotrexate Nausea Only and Other (See Comments)    Nose bleeds  . Spironolactone Rash    Rash with itching unresponsive to benadryl .   . Doxycycline GI Intolerance  . Ioxaglate Sodium Rash  . Penicillins Rash and Other (See Comments)    Patient tolerated cefazolin on 01/10/21  . Quinine Other (See Comments)    Patient doesn't remember reaction    Physical Exam     BP 113/63   Pulse 74   Temp 97.7 F (36.5 C)   Resp 14   Ht 1.549 m (5' 1)   Wt 46.1 kg (101 lb 9.6 oz)   SpO2 (!) 88%   PF (!) 3 L/min Comment: pulse  BMI 19.20 kg/m   Physical Exam Vitals and nursing note reviewed.  Constitutional:      Comments: Thin, frail, chronically ill appearing 83 year old female in no acute distress.  HENT:     Head: Normocephalic.     Nose: Nose normal.     Mouth/Throat:     Mouth: Mucous membranes are moist.  Eyes:     General: No scleral icterus.    Conjunctiva/sclera: Conjunctivae normal.  Cardiovascular:     Rate and Rhythm: Normal rate and regular rhythm.     Pulses: Normal pulses.     Heart sounds: Normal heart sounds.  Pulmonary:     Breath sounds: No wheezing or rales.     Comments: Increased respiratory effort.  Decreased breath sounds to left lower lobe but no wheezes or crackles noted.  O2 per nasal cannula present Abdominal:     General: There is no distension.     Palpations: Abdomen is soft.     Tenderness: There is no abdominal tenderness.     Comments: Hyperactive bowel sounds throughout  Musculoskeletal:        General: Normal range of motion.     Cervical back: Neck  supple.     Right lower leg: Edema (Non pitting ankle and pedal edema that does not extend to the lower leg) present.     Left lower leg: Edema (Non pitting ankle and pedal edema that does not extend to the lower leg) present.  Skin:    General: Skin is warm and dry.  Neurological:     Mental Status: She is alert and oriented to person, place, and time. Mental status is at baseline.     Sensory: Sensory deficit (impaired hearing) present.  Psychiatric:        Behavior: Behavior normal.      Health Maintenance and Opportunities in Care    Immunizations by Immunization Family     Covid-19 Vaccine Unspecified 01/26/2020 (83 y.o.) 02/23/2020 (83 y.o.)     Influenza LAIV (Nasal) 07/20/2016 (83 y.o.)      Influenza, High-dose Seasonal, Quadrivalent, Preservative Free 07/12/2019 (83 y.o.) 07/26/2020 (83 y.o.) 09/30/2021 (83 y.o.) 07/30/2022 (83 y.o.)   Influenza, Recombinant, Quadrivalent, Injectable, Preservative Free (Egg Free) 07/20/2016 (83 y.o.)      Influenza, Recombinant, trivalent, PF 07/20/2016 (83 y.o.)      Influenza, Unspecified 07/03/2014 (83  y.o.) 07/12/2019 (83 y.o.) 07/26/2020 (83 y.o.)    Influenza, high-dose, trivalent, PF 06/17/2017 (83 y.o.) 07/21/2018 (83 y.o.) 07/28/2023 (83 y.o.)    Pneumococcal Conjugate 13-Valent 04/03/2014 (83 y.o.)      Pneumococcal Conjugate Vaccine 20-Valent (PREVNAR-20) 6 wks+ 12/29/2022 (83 y.o.)      Pneumococcal Polysaccharide Vaccine, 23 Valent (PNEUMOVAX-23) 2Y+ 11/09/1997 (83 y.o.) 01/08/2011 (83 y.o.)     Pneumococcal, Unspecified 07/03/2014 (83 y.o.)      TDAP VACCINE (BOOSTRIX,ADACEL) 7Y+ 01/08/2011 (83 y.o.)      Varicella Zoster [facility]) 18Y+ 05/29/2023 (83 y.o.)          Scheduled Future Appointments       Provider Department Dept Phone Center   08/16/2024 4:20 PM Eva Pastor [facility]  - Pulmonary [place] [phone] [facility] [place]   02/27/2025 11:30 AM Alverna Lamar Sieving [facility]  -  Cardiology [employer] [phone] [facility] [address]        Electronically signed by: Baker Adela Bare, ANP 07/05/2024 3:36 PM

## 2024-07-05 NOTE — Progress Notes (Signed)
 Subjective:   Patient ID: Kathleen Pace is a 83 y.o. female.  HPI  82 year old ex-smoker with severe COPD who presents for follow-up.  Patient was recently hospitalized for COPD exacerbation.  CTA chest completed during hospitalization showed no PE, 4 mm left upper lobe lung nodule was noted.  Patient has remained on continuous oxygen therapy at 2 L/min post hospitalization.  She has started home OT/PT sessions.  She acknowledges general weakness and recent wheezing.  She is interested in switching from Anoro to alternate maintenance medication for COPD.  Patient reports no recent fever, chills, chest pain, or hemoptysis.  Review of Systems   A complete ROS was performed with pertinent positives/negatives noted in the HPI. The remainder of the ROS is negative.  Objective:   Physical Exam   Constitutional: She is oriented to person, place, and time. She appears well-developed and well-nourished.  Head: Normocephalic and atraumatic.  Eyes: Conjunctivae are normal. Pupils are equal, round, and reactive to light.  Neck: Normal range of motion.  Cardiovascular: Normal rate and regular rhythm. Exam reveals no gallop and no friction rub.  No murmur heard. Pulmonary/Chest: Effort normal and breath sounds diminished. Abdominal: Soft.  Musculoskeletal: Normal range of motion.  Neurological: She is alert and oriented to person, place, and time.  Skin: Skin is warm and dry.  Psychiatric: She has a normal mood and affect. Her behavior is normal. Judgment and thought content normal.   Assessment/Plan:   1.  COPD, severe 2.  Chronic respiratory failure with hypoxia  Records from patient's recent hospitalization were reviewed.  She will temporarily discontinue Anoro, received samples x 2 of Trelegy 100 with instructions at 1 inhalation daily, rinse mouth after use.  Patient will continue medication if tolerated well, was informed potential side effects.  She will proceed with home PT/OT  sessions as scheduled.  Patient will return to our office in 6 weeks for reevaluation.  We discussed having repeat PFTs performed but will hold on testing until schedule reevaluation.  She will visit our office sooner if needed

## 2024-07-10 ENCOUNTER — Emergency Department (HOSPITAL_COMMUNITY)
Admission: EM | Admit: 2024-07-10 | Discharge: 2024-07-10 | Disposition: A | Discharge status: Home / Self Care | Attending: Emergency Medicine | Admitting: Emergency Medicine

## 2024-07-10 ENCOUNTER — Emergency Department (HOSPITAL_COMMUNITY)

## 2024-07-10 ENCOUNTER — Telehealth: Payer: Self-pay

## 2024-07-10 DIAGNOSIS — Z79899 Other long term (current) drug therapy: Secondary | ICD-10-CM | POA: Insufficient documentation

## 2024-07-10 DIAGNOSIS — Z7901 Long term (current) use of anticoagulants: Secondary | ICD-10-CM | POA: Diagnosis not present

## 2024-07-10 DIAGNOSIS — I4891 Unspecified atrial fibrillation: Secondary | ICD-10-CM | POA: Insufficient documentation

## 2024-07-10 DIAGNOSIS — I639 Cerebral infarction, unspecified: Secondary | ICD-10-CM | POA: Diagnosis not present

## 2024-07-10 DIAGNOSIS — J449 Chronic obstructive pulmonary disease, unspecified: Secondary | ICD-10-CM | POA: Insufficient documentation

## 2024-07-10 DIAGNOSIS — R531 Weakness: Secondary | ICD-10-CM | POA: Insufficient documentation

## 2024-07-10 DIAGNOSIS — D696 Thrombocytopenia, unspecified: Secondary | ICD-10-CM | POA: Diagnosis not present

## 2024-07-10 DIAGNOSIS — G459 Transient cerebral ischemic attack, unspecified: Secondary | ICD-10-CM

## 2024-07-10 DIAGNOSIS — Z7982 Long term (current) use of aspirin: Secondary | ICD-10-CM | POA: Insufficient documentation

## 2024-07-10 DIAGNOSIS — R29702 NIHSS score 2: Secondary | ICD-10-CM

## 2024-07-10 LAB — I-STAT CHEM 8, ED
BUN: 17 mg/dL (ref 8–23)
Calcium, Ion: 0.9 mmol/L — ABNORMAL LOW (ref 1.15–1.40)
Chloride: 101 mmol/L (ref 98–111)
Creatinine, Ser: 0.7 mg/dL (ref 0.44–1.00)
Glucose, Bld: 93 mg/dL (ref 70–99)
HCT: 30 % — ABNORMAL LOW (ref 36.0–46.0)
Hemoglobin: 10.2 g/dL — ABNORMAL LOW (ref 12.0–15.0)
Potassium: 5.2 mmol/L — ABNORMAL HIGH (ref 3.5–5.1)
Sodium: 133 mmol/L — ABNORMAL LOW (ref 135–145)
TCO2: 29 mmol/L (ref 22–32)

## 2024-07-10 LAB — I-STAT ARTERIAL BLOOD GAS, ED
Acid-Base Excess: 4 mmol/L — ABNORMAL HIGH (ref 0.0–2.0)
Bicarbonate: 29.8 mmol/L — ABNORMAL HIGH (ref 20.0–28.0)
Calcium, Ion: 1.04 mmol/L — ABNORMAL LOW (ref 1.15–1.40)
HCT: 28 % — ABNORMAL LOW (ref 36.0–46.0)
Hemoglobin: 9.5 g/dL — ABNORMAL LOW (ref 12.0–15.0)
O2 Saturation: 100 %
Patient temperature: 97.6
Potassium: 4.5 mmol/L (ref 3.5–5.1)
Sodium: 134 mmol/L — ABNORMAL LOW (ref 135–145)
TCO2: 31 mmol/L (ref 22–32)
pCO2 arterial: 51.2 mmHg — ABNORMAL HIGH (ref 32–48)
pH, Arterial: 7.371 (ref 7.35–7.45)
pO2, Arterial: 382 mmHg — ABNORMAL HIGH (ref 83–108)

## 2024-07-10 LAB — CBG MONITORING, ED: Glucose-Capillary: 93 mg/dL (ref 70–99)

## 2024-07-10 MED ORDER — METHYLPREDNISOLONE SODIUM SUCC 125 MG IJ SOLR
125.0000 mg | Freq: Once | INTRAMUSCULAR | Status: AC
  Administered 2024-07-10: 125 mg via INTRAVENOUS
  Filled 2024-07-10: qty 2

## 2024-07-10 MED ORDER — ALBUTEROL SULFATE (2.5 MG/3ML) 0.083% IN NEBU
5.0000 mg | INHALATION_SOLUTION | Freq: Once | RESPIRATORY_TRACT | Status: AC
  Administered 2024-07-10: 5 mg via RESPIRATORY_TRACT
  Filled 2024-07-10: qty 6

## 2024-07-10 MED ORDER — PREDNISONE 10 MG PO TABS: 20.0000 mg | ORAL_TABLET | Freq: Every day | ORAL | 0 refills | Status: DC

## 2024-07-10 MED ORDER — PREDNISONE 10 MG (21) PO TBPK
ORAL_TABLET | ORAL | 0 refills | Status: DC
Start: 2024-07-10 — End: 2024-07-27

## 2024-07-10 MED ORDER — SODIUM CHLORIDE 0.9% FLUSH: 3.0000 mL | Freq: Once | INTRAVENOUS | Status: DC

## 2024-07-10 NOTE — ED Notes (Signed)
 Patient family members are refusing lab work at the moment. they stated they would like to wait for the iv team to start iv and collect labs then.

## 2024-07-10 NOTE — Consult Note (Addendum)
 NEUROLOGY CONSULT NOTE   Date of service: July 10, 2024 Kathleen Pace Name: Kathleen Pace MRN:  [id number] DOB:  July 09, 1941 Chief Complaint: CODE STROKE Requesting Provider: Levander Houston, MD  History of Present Illness  Kathleen Pace is a 83 y.o. female with hx of RA, COPD on 2 L chronically, PE (09/2023) on Eliquis, HFpEF, hypothyroidism, GERD, chronic immune thrombocytopenia, pernicious anemia who was brought in as an EMS-activated CODE STROKE due to left-sided weakness. Deficits were noted at 0645 this morning with 0630 when daughter was getting her out of bed. Per EMS, Kathleen Pace exhibited left arm weakness, en route she required O2 via NRB.   On exam at bridge, Kathleen Pace was awake and alert, oriented, generalized weakness with some questionable left mild facial droop and slight left arm weakness. Kathleen Pace was tachypneic and labored in her breathing. CBG and BP WNL. IV and lab draws were attempted, but unsuccessful. Taken to CT, CTH negative.   Kathleen Pace was able to tell us  that her breathing was worse this morning and felt a little better now. She also endorsed that her arm was a lot weaker earlier this morning.   Review of last admission records reveals she was discharged home with home hospice with a DNR/DNI status.  Family reports that they were given information that her COPD is end-stage but the pulmonologist said that there is severe COPD and not end-stage COPD and they are not sure as to what her pulmonary status truly is since she has been in and out of the hospital.  Daughter confirmed the DNR/DNI status  LKW: 0630 Modified rankin score: 4-Needs assistance to walk and tend to bodily needs IV Thrombolysis: No, on Eliquis and chronically thrombocytopenic. EVT: No, no LVO suspected  NIHSS components Score: Comment  1a Level of Conscious 0[x]  1[]  2[]  3[]      1b LOC Questions 0[x]  1[]  2[]       1c LOC Commands 0[x]  1[]  2[]       2 Best Gaze 0[x]  1[]  2[]       3 Visual 0[x]  1[]   2[]  3[]      4 Facial Palsy 0[]  1[x]  2[]  3[]      5a Motor Arm - left 0[]  1[x]  2[]  3[]  4[]  UN[]    5b Motor Arm - Right 0[x]  1[]  2[]  3[]  4[]  UN[]    6a Motor Leg - Left 0[x]  1[]  2[]  3[]  4[]  UN[]    6b Motor Leg - Right 0[x]  1[]  2[]  3[]  4[]  UN[]    7 Limb Ataxia 0[x]  1[]  2[]  UN[]      8 Sensory 0[x]  1[]  2[]  UN[]      9 Best Language 0[x]  1[]  2[]  3[]      10 Dysarthria 0[x]  1[]  2[]  UN[]      11 Extinct. and Inattention 0[x]  1[]  2[]       TOTAL:  2      ROS  Comprehensive ROS performed and pertinent positives documented in HPI   Past History   Past Medical History:  Diagnosis Date   Iron  deficiency anemia 11/28/2015   Malabsorption of iron  11/28/2015    No past surgical history on file.  Family History: No family history on file.  Social History  reports that she quit smoking about 29 years ago. Her smoking use included cigarettes. She started smoking about 62 years ago. She has a 48.8 pack-year smoking history. She has never used smokeless tobacco. She reports current alcohol use of about 1.0 standard drink of alcohol per week. She reports that she does not use drugs.  Allergies  Allergen  Reactions   Iodinated Contrast Media Swelling   Sulfa Antibiotics Other (See Comments)    Drops Blood Pressure   Methotrexate Nausea Only and Other (See Comments)    Nose bleeds   Spironolactone Rash    Rash with itching unresponsive to benadryl .    Doxycycline Nausea Only   Penicillins Rash    Kathleen Pace tolerated cefazolin on 01/10/21   Quinine Derivatives Other (See Comments)    Kathleen Pace doesn't remember reaction    Medications   Current Facility-Administered Medications:    methylPREDNISolone  sodium succinate (SOLU-MEDROL ) 125 mg/2 mL injection 125 mg, 125 mg, Intravenous, Once, Levander Houston, MD   sodium chloride  flush (NS) 0.9 % injection 3 mL, 3 mL, Intravenous, Once, Levander Houston, MD  Current Outpatient Medications:    acetaminophen  (TYLENOL ) 650 MG CR tablet, Take by mouth., Disp: , Rfl:     albuterol  (PROVENTIL  HFA;VENTOLIN  HFA) 108 (90 Base) MCG/ACT inhaler, Inhale 2 puffs into the lungs every 4 (four) hours as needed., Disp: , Rfl:    amLODipine (NORVASC) 10 MG tablet, Take 10 mg by mouth daily., Disp: , Rfl:    apixaban (ELIQUIS) 2.5 MG TABS tablet, Take 2.5 mg by mouth 2 (two) times daily., Disp: , Rfl:    aspirin 81 MG EC tablet, Take 1 tablet by mouth daily., Disp: , Rfl:    atorvastatin (LIPITOR) 80 MG tablet, SMARTSIG:1 Tablet(s) By Mouth Every Evening, Disp: , Rfl:    CALCIUM-VITAMIN D  PO, Take 1 capsule by mouth every morning. Calcium 600 mg  Vitamin D  200, Disp: , Rfl:    Cholecalciferol (VITAMIN D ) 2000 UNITS tablet, Take 2,000 Units by mouth daily., Disp: , Rfl:    cyanocobalamin  (VITAMIN B12) 1000 MCG/ML injection, INJECT 1 ML EVERY MONTH INTO YOUR MUSCLE, Disp: 3 mL, Rfl: 24   esomeprazole (NEXIUM) 20 MG capsule, Take 20 mg by mouth daily at 12 noon., Disp: , Rfl:    hydroxychloroquine (PLAQUENIL) 200 MG tablet, 200 mg daily. , Disp: , Rfl:    leflunomide (ARAVA) 10 MG tablet, Take 10 mg by mouth daily. Take 1 and 1/2 tablet total of 15 mg daily., Disp: , Rfl:    levothyroxine (SYNTHROID, LEVOTHROID) 125 MCG tablet, 125 mcg daily. 08/31/2019 One day each week, will increase to 1.5 tabs., Disp: , Rfl:    megestrol  (MEGACE ) 400 MG/10ML suspension, Take 10 mLs (400 mg total) by mouth 2 (two) times daily., Disp: 480 mL, Rfl: 4   metoprolol succinate (TOPROL-XL) 25 MG 24 hr tablet, Take 25 mg by mouth daily., Disp: , Rfl:    mirtazapine  (REMERON ) 7.5 MG tablet, Take 1 tablet (7.5 mg total) by mouth at bedtime. (Kathleen Pace not taking: Reported on 05/16/2024), Disp: 30 tablet, Rfl: 6   naloxone (NARCAN) nasal spray 4 mg/0.1 mL, Place 1 spray into the nose once. (Kathleen Pace not taking: Reported on 05/16/2024), Disp: , Rfl:    ondansetron (ZOFRAN-ODT) 8 MG disintegrating tablet, Take 8 mg by mouth every 8 (eight) hours as needed., Disp: , Rfl:    sertraline (ZOLOFT) 50 MG tablet,  Take 50 mg by mouth daily., Disp: , Rfl:    umeclidinium-vilanterol (ANORO ELLIPTA) 62.5-25 MCG/INH AEPB, Inhale into the lungs daily. , Disp: , Rfl:    valsartan (DIOVAN) 320 MG tablet, Take by mouth daily. , Disp: , Rfl:   Vitals   Vitals:   07/10/24 0749 07/10/24 0802 07/10/24 0805  BP:   (!) 151/64  Pulse:  74   Resp:  (!) 24   Temp:  97.6 F (36.4 C)    TempSrc: Temporal    SpO2:  97%     There is no height or weight on file to calculate BMI.   Physical Exam   Constitutional: Appears elderly and chronically ill.  Cardiovascular: Normal rate and regular rhythm.  Respiratory: Labored, tachypneic, requiring supplemental oxygenation  Neurologic Examination  Mental Status: Kathleen Pace is awake, alert, oriented to person, place, month, year, and situation. Kathleen Pace is able to give a clear and coherent history. No signs of aphasia or neglect.  Cranial Nerves: II: Visual Fields are full. Pupils are equal, round, and reactive to light.   III,IV, VI: EOMI without ptosis or diploplia.  V: Facial sensation is symmetric to temperature VII: Facial movement is symmetric with subtle left facial asymmetry but she is also edentulous today which makes that exam harder.  VIII: hearing is intact to voice X: Normal phonation. No dysarthria.  XI: Shoulder shrug is symmetric. XII: tongue is midline without atrophy or fasciculations.  Motor: Tone is normal. Bulk is normal.  Generalized weakness. Slight drift and reduced grip to LUE.  Sensory: Sensation is symmetric to light touch and temperature in the arms and legs. Cerebellar: FNF slow, but intact bilaterally   Labs/Imaging/Neurodiagnostic studies   CBC:  Recent Labs  Lab Aug 01, 2024 0840 01-Aug-2024 0841  HGB 9.5* 10.2*  HCT 28.0* 30.0*   Basic Metabolic Panel:  Lab Results  Component Value Date   NA 133 (L) 08-01-2024   K 5.2 (H) 2024/08/01   CO2 26 05/16/2024   GLUCOSE 93 2024-08-01   BUN 17 08/01/2024   CREATININE 0.70  Aug 01, 2024   CALCIUM 10.1 05/16/2024   GFRNONAA >60 05/16/2024   GFRAA >60 07/19/2020   CT Head without contrast(Personally reviewed): No acute cortically based infarct or acute intracranial hemorrhage identified.  ASPECTS 10. Moderate for age cerebral white matter disease. Right greater than left tympanic cavity and mastoid air cell opacification, consider bilateral otitis media.   ASSESSMENT   ZIYAH CORDOBA is a 83 y.o. female with hx of RA, COPD on 2 L chronically, PE (09/2023) on Eliquis, HFpEF, hypothyroidism, GERD, chronic immune thrombocytopenia, pernicious anemia who was brought in as an EMS-activated CODE STROKE due to left-sided weakness.  Kathleen Pace is a home hospice Kathleen Pace after being discharged from [facility] earlier this month. Discussed assessment with daughter at bedside. CTH is negative for bleed. From neurological standpoint, we do not need further imaging as she is not a candidate for intervention due to her high mRs, hospice status.  She is adequately treated with Eliquis from a stroke prevention standpoint.  After bedside conversation, daughter confirms DNR status for her mother.   Impression: Likely old stroke or TIA in Kathleen Pace with chronic disease on hospice, no further interventions  RECOMMENDATIONS   - no further imaging needed from neurological standpoint - continue Eliquis for stroke prevention -Medical management per ER Inpatient neurology will be available with questions as needed  ___________________________________________________________________    Signed, Rocky JAYSON Likes, NP [facility]   Attending Neurohospitalist Addendum Kathleen Pace seen and examined with APP/Resident. Agree with the history and physical as documented above. Agree with the plan as documented, which I helped formulate. I have independently reviewed the chart, obtained history, review of systems and examined the Kathleen Pace.I have personally reviewed pertinent head/neck/spine  imaging (CT/MRI).  Currently on home hospice, brought in with left-sided deficits.  Deficits improved on the way.  Small stroke or TIA possible.  Adequately treated with Eliquis in terms of stroke  prevention measures. Given her high disability, chronic thrombocytopenia and being on Eliquis-not a candidate for further stroke interventions. Had detailed conversation about CODE STATUS with daughter who confirmed DNR/DNI status.  May need further conversations regarding hospice and comfort measures based on further workup in the ED. Inpatient neurology will be available as needed. Plan discussed in detail with Dr. Levander and Kathleen Pace's daughter at bedside.   Please feel free to call with any questions.  -- Eligio Lav, MD Neurologist [facility] Pager: [phone]     CRITICAL CARE ATTESTATION Performed by: Eligio Lav, MD Total critical care time: 60 minutes Critical care time was exclusive of separately billable procedures and treating other patients and/or supervising APPs/Residents/Students Critical care was necessary to treat or prevent imminent or life-threatening deterioration. This Kathleen Pace is critically ill and at significant risk for neurological worsening and/or death and care requires constant monitoring. Critical care was time spent personally by me on the following activities: development of treatment plan with Kathleen Pace and/or surrogate as well as nursing, discussions with consultants, evaluation of Kathleen Pace's response to treatment, examination of Kathleen Pace, obtaining history from Kathleen Pace or surrogate, ordering and performing treatments and interventions, ordering and review of laboratory studies, ordering and review of radiographic studies, pulse oximetry, re-evaluation of Kathleen Pace's condition, participation in multidisciplinary rounds and medical decision making of high complexity in the care of this Kathleen Pace.

## 2024-07-10 NOTE — ED Notes (Signed)
 CCMD called by this RN

## 2024-07-10 NOTE — Progress Notes (Signed)
    PATIENTBETHA Pace, Kathleen Pace     FIN #: [id number]  MRN:  [id number] PROVIDER Name:        JYOTI RISAL MD     PHYSICIAN RESPONSE: Acute respiratory failure DOCUMENTATION CLARIFICATION:  MRN: [id number]  Acute Respiratory Failure is documented in the medical record. This diagnosis requires validation.  - Acute Respiratory Failure exists. Please specify validating clinical findings. - Acute Respiratory Insufficiency, Acute Respiratory Failure has been ruled out - Hypoxia, Acute Respiratory Failure has been ruled out - Shortness of breath, Acute Respiratory Failure has been ruled out - Other, please specify    The patient's Clinical Indicators include: Patient admitted with SOB, diagnosed with COPD exacerbation, treated with steroids  ED - acute on chronic hypoxemic respiratory failure PN - 9/7  acute hypoxic respiratory failure possibly 2/2 COPD Exacerbation - persistent SOB, wheezing, cough, and sputum production despite prior antibiotics/steroids - she uses 2 L oxygen at home PN - 9/8 - COPD exacerbation - on baseline O2, on prednisone  - continue Yupelri, Pulmicort, Preformist PN - 9/9 - O2 sat needed to be bumped up to 4 L to maintain O2 sat around 90% PN 9/10 - O2 Flow Rate (L/min): 3 L/min DS - patient was very dyspneic on limiting exertion, needing up to 4 L during ambulation  --  Reference:  AH defines Acute Respiratory failure as impaired oxygenation or ventilation supported by documentation of Physical findings + Corroborating Clinical Indicators + Treatment  Physical findings: e.g., Accessory muscle use, poor air movement, inability to speak in sentences, tripoding, cyanosis, altered consciousness, pursed lip  Corroborating Clinical Indicators: e.g., ABG, venous blood gas, P/F ratio on oxygen, pO2, SpO2  Intervention / Treatment:  - Supplemental O2: 4L or more - Invasive or non-invasive mechanical ventilation, e.g. CPAP, BiPAP, high flow [place], mask - Shorter  durations of treatment, because symptoms resolved, may represent efficient care and does not necessarily mean that the patient does not have the diagnosis  Since each case may not have any/all the Corroborating Clinical Indicators, it is essential that the documentation include all elements to support the diagnosis.    Clarification initiated by: Metro Channel on 07/06/2024 10:51 AM  Disclaimer: The purpose of this clarification is to ensure the accuracy and integrity of the documentation, and resultant codes, where this is conflicting, ambiguous or incomplete information, or clinical evidence for a higher degree of specificity or severity. In addition to clinical responses provided, providers are also presented with free-text and unable to determine response options.    Electronically signed by:  JYOTI RISAL MD 07/10/2024 12:07 AM

## 2024-07-10 NOTE — ED Notes (Signed)
 Per daughter and granddaughter in communication with Ray MD, labs will not be drawn due to pt wishes of going home

## 2024-07-10 NOTE — Telephone Encounter (Signed)
 Lauren at [facility] called -verbal order for hospice from Edsel Dade MD

## 2024-07-10 NOTE — Discharge Instructions (Addendum)
 You were seen today for stroke and breathing difficulties.  You are on blood thinners.  Neurologist did not think there was any intervention today to improve your outcome of your stroke.  On your exam you appear to be improving from those symptoms. You are being treated with steroids for your lung disease.  You were given a dose IV here.  You are given a prescription to take at home.  You should continue your home breathing treatments. Please call hospice for assistance with care. If you change your mind and want more aggressive treatment, you can return to the hospital at any time.  ### Hospice Care Information     Hospice care is a special kind of support for people who have a serious illness and are likely to live six months or less if their illness follows its usual course. The main goal of hospice is to help patients live as comfortably as possible, focusing on relief from pain and other symptoms, and supporting both patients and their families emotionally and spiritually.[1][2][3][4][5][6][7][8][9]      **What Hospice Care Offers:**      - **Comfort-Focused Care:** Hospice does not try to cure the illness, but instead helps manage pain, breathing problems, nausea, and other symptoms so patients can feel better and enjoy time with loved ones.[1][2][3][5][6][7][8][9]      - **Support for Families:** Hospice teams help families cope with stress, grief, and practical needs. They offer counseling, spiritual care, and help with planning for end-of-life wishes.[2][3][4][7][8][9]      - **Care Where You Live:** Most hospice care happens at home, but it can also be provided in nursing homes, assisted living facilities, hospitals, or special hospice centers, depending on what is best for the patient and family.[2][3][4][5][7][9]      - **Team Approach:** Hospice care is provided by a team that may include doctors, nurses, social workers, home health aides, chaplains, counselors, and trained volunteers.  This team works together to meet the physical, emotional, and spiritual needs of patients and families.[1][2][3][4][5][7][8][9]      - **Medical Equipment and Medicines:** Hospice provides equipment (like hospital beds or oxygen) and medicines needed to keep the patient comfortable at home.[3][4][9]      - **Respite and Bereavement Support:** Hospice can offer short-term care in a facility if family caregivers need a break, and provides support for families after the patient dies.[3][4][7][8][9]      **Who Can Get Hospice Care?**      - Hospice is for people with a life-limiting illness and a life expectancy of six months or less, as determined by their doctor and the hospice medical director.[1][2][3][4][5][6][7][8][9]      - Patients must choose to focus on comfort rather than treatments aimed at curing the illness. However, children and teens on Medicaid or CHIP may be able to receive hospice care along with treatments for their illness.[4][6]      **How Hospice Care Is Paid For:**      - In the [place] , hospice is usually covered by Medicare, Medicaid, and many private insurance plans. This coverage includes staff visits, medicines, equipment, and supplies related to the terminal illness.[3][4][5][9][8]      **What to Expect:**      - Hospice care is personalized. The team will talk with the patient and family to understand their wishes and goals, and make a care plan that fits their needs.[1][2][8]      - Patients and families can ask questions, share concerns, and change their minds  about hospice at any time. Hospice care can be stopped or restarted if the patient's situation changes.[2][9]      - The hospice team will check in regularly and adjust care as needed to keep the patient comfortable and support the family.[8][9]      **Benefits of Hospice Care:**      - Studies show that hospice care helps patients have better symptom control, less distress, and a higher quality of  life. Families also                              ### References  1. Palliative Care for the Seriously Ill. Charleen AS, Jesus MINES. The [place] Journal of Medicine. 2015;373(8):747-55. doi:10.1056/NEJMra1404350. 57. An 16-Year-Old Woman With Cardiac Cachexia Contemplating the End of Her Life: Review of Hospice Care. Kutner JS. JAMA. 2010;303(4):349-56. doi:10.1001/jama.7990.7984. 3. EMS Care of Adult Hospice Patients- A Position Statement and Resource Document of NAEMSP and AAHPM. Breyre AM, Cesario DH, Brooten JK, et al. Prehospital Emergency Care. 2023;27(5):560-565. doi:10.1080/10903127.2023.2193978. 4. Guidance for Pediatric End-of-Life Care. Elvie HARLAND Vicci LULLA Cecily RD, et al. Pediatrics. 2022;149(5):e2022057011. doi:10.1542/peds.906 875 0579. 5. Hospice and Palliative Care: An Overview. Lavonia PE, Moishe APPLETHWAITE. [facility] [place]. 2020;104(3):359-373. doi:10.1016/j.mcna.2020.01.001. 6. Concurrent Care as the Next Frontier in End-of-Life Care. Ernecoff Arnegard, Anhang Price R. JAMA Health Forum. 2023;4(8):e232603. doi:10.1001/jamahealthforum.2023.2603. 7. Palliative Care Early in the Care Continuum Among Patients With Serious Respiratory Illness: An Official ATS/AAHPM/HPNA/SWHPN Product manager. Floretta DR, Cecile AS, Rexford RAMAN, et al. American Journal of Respiratory and Critical Care Medicine. 2022;206(6):e44-e69. doi:10.1164/rccm.797792-8737DU. 8. NCCN Guidelines Insights: Palliative Care, Version 2.2017. Kai CHRISTELLA Sharps T, Back A, et al. Journal of the [employer] : [employer]. 2017;15(8):989-997. doi:10.6004/jnccn.7982.9867. 9. Serving Patients Who May Die Soon and Their Families: The Role of Hospice and Other Services. Macario ALF JAMA. 2001;285(7):925-32. doi:10.1001/jama.285.7.925.

## 2024-07-10 NOTE — Code Documentation (Addendum)
 Stroke Response Nurse Documentation Code Documentation  Kathleen Pace is a 83 y.o. female arriving to [facility] via [facility] as Code Stroke. Pt with history of COPD, afib on Eliquis, thrombocytopenia.  Recently admitted with PE. Pt coming from home on Hospice. LKW Kathleen Pace when daughter was checking on her/assisting her with bathroom needs. When she returned a few moments later, daughter noted left droop and left weakness.   Stroke team met pt at bridge, labs unable to be drawn, EDP cleared airway and pt taken to CT. CT completed. NIH 2, see flowsheet. No TNK due to OOW and on Eliquis. Not a candidate for thrombectomy. Care Plan: NIH qshift per MD Voncile. NPO until swallow screen. Bedside handoff with ED RN Raymar.    Tonna Lacks K  Rapid Response RN

## 2024-07-10 NOTE — ED Notes (Signed)
 Phlebotomy to collect outstanding blood work.

## 2024-07-10 NOTE — ED Notes (Signed)
 Phlebotomy to come to bedside and try again to collect outstanding blood.

## 2024-07-10 NOTE — ED Provider Notes (Signed)
 [place] EMERGENCY DEPARTMENT AT [facility] Provider Note   CSN: [id number] Arrival date & time: 07/10/24  0745  An emergency department physician performed an initial assessment on this suspected stroke patient at 0745.  Patient presents with: Code Stroke   Kathleen Pace is a 83 y.o. female.   HPI 83 yo female ho copd, hospice reports via ems that patient lkw at bed time this am 745 with left arm weakness.  Patient is on oxygen at 3 l/m  Patient had low sats at 85% on ems arrival andplaced on nrb.HO a fib on eliquis, Patient discharge 06/30/24 from [facility] with hospice with focus on comfort       Prior to Admission medications   Medication Sig Start Date End Date Taking? Authorizing Provider  predniSONE  (STERAPRED UNI-PAK 21 TAB) 10 MG (21) TBPK tablet Per packag instructions 07/10/24  Yes Galya Dunnigan, Edsel, MD  acetaminophen  (TYLENOL ) 650 MG CR tablet Take by mouth.    [provider]  albuterol  (PROVENTIL  HFA;VENTOLIN  HFA) 108 (90 Base) MCG/ACT inhaler Inhale 2 puffs into the lungs every 4 (four) hours as needed. 02/16/17   [provider]  amLODipine (NORVASC) 10 MG tablet Take 10 mg by mouth daily. 11/13/20   [provider]  apixaban (ELIQUIS) 2.5 MG TABS tablet Take 2.5 mg by mouth 2 (two) times daily. 03/20/24   [provider]  aspirin 81 MG EC tablet Take 1 tablet by mouth daily. 10/18/20   [provider]  atorvastatin (LIPITOR) 80 MG tablet SMARTSIG:1 Tablet(s) By Mouth Every Evening 11/13/20   [provider]  CALCIUM-VITAMIN D  PO Take 1 capsule by mouth every morning. Calcium 600 mg  Vitamin D  200    [provider]  Cholecalciferol (VITAMIN D ) 2000 UNITS tablet Take 2,000 Units by mouth daily.    [provider]  cyanocobalamin  (VITAMIN B12) 1000 MCG/ML injection INJECT 1 ML EVERY MONTH INTO YOUR MUSCLE 06/25/24   Timmy Maude SAUNDERS, MD  esomeprazole (NEXIUM) 20 MG capsule Take 20 mg by  mouth daily at 12 noon.    [provider]  hydroxychloroquine (PLAQUENIL) 200 MG tablet 200 mg daily.     [provider]  leflunomide (ARAVA) 10 MG tablet Take 10 mg by mouth daily. Take 1 and 1/2 tablet total of 15 mg daily. 08/26/18   [provider]  levothyroxine (SYNTHROID, LEVOTHROID) 125 MCG tablet 125 mcg daily. 08/31/2019 One day each week, will increase to 1.5 tabs. 06/02/16   [provider]  megestrol  (MEGACE ) 400 MG/10ML suspension Take 10 mLs (400 mg total) by mouth 2 (two) times daily. 05/16/24   Timmy Maude SAUNDERS, MD  metoprolol succinate (TOPROL-XL) 25 MG 24 hr tablet Take 25 mg by mouth daily. 11/13/20   [provider]  mirtazapine  (REMERON ) 7.5 MG tablet Take 1 tablet (7.5 mg total) by mouth at bedtime. Patient not taking: Reported on 05/16/2024 12/15/23   Tonette Lauraine CHRISTELLA, PA-C  naloxone [facility]) nasal spray 4 mg/0.1 mL Place 1 spray into the nose once. Patient not taking: Reported on 05/16/2024 08/20/23   [provider]  ondansetron (ZOFRAN-ODT) 8 MG disintegrating tablet Take 8 mg by mouth every 8 (eight) hours as needed. 01/09/22   [provider]  sertraline (ZOLOFT) 50 MG tablet Take 50 mg by mouth daily.    [provider]  umeclidinium-vilanterol (ANORO ELLIPTA) 62.5-25 MCG/INH AEPB Inhale into the lungs daily.  04/23/17   [provider]  valsartan (DIOVAN) 320  MG tablet Take by mouth daily.  07/21/18   [provider]    Allergies: Iodinated contrast media, Sulfa antibiotics, Methotrexate, Spironolactone, Doxycycline, Penicillins, and Quinine derivatives    Review of Systems  Updated Vital Signs BP (!) 151/64 (BP Location: Left Arm)   Pulse 74   Temp 97.6 F (36.4 C) (Temporal)   Resp (!) 24   SpO2 92% Comment: 3L [place]  Physical Exam  (all labs ordered are listed, but only abnormal results are displayed) Labs Reviewed  I-STAT CHEM 8, ED - Abnormal; Notable for the following  components:      Result Value   Sodium 133 (*)    Potassium 5.2 (*)    Calcium, Ion 0.90 (*)    Hemoglobin 10.2 (*)    HCT 30.0 (*)    All other components within normal limits  I-STAT ARTERIAL BLOOD GAS, ED - Abnormal; Notable for the following components:   pCO2 arterial 51.2 (*)    pO2, Arterial 382 (*)    Bicarbonate 29.8 (*)    Acid-Base Excess 4.0 (*)    Sodium 134 (*)    Calcium, Ion 1.04 (*)    HCT 28.0 (*)    Hemoglobin 9.5 (*)    All other components within normal limits  PROTIME-INR  APTT  CBC  DIFFERENTIAL  COMPREHENSIVE METABOLIC PANEL WITH GFR  ETHANOL  CBG MONITORING, ED    EKG: None  Radiology: [facility] Chest Port 1 View Result Date: 07/10/2024 CLINICAL DATA:  Cough and hypoxia. EXAM: PORTABLE CHEST 1 VIEW COMPARISON:  09/09/2023 FINDINGS: Lungs are hyperexpanded. The lungs are clear without focal pneumonia, edema, pneumothorax or pleural effusion. Interstitial markings are diffusely coarsened with chronic features. The cardio pericardial silhouette is enlarged. Status post TAVR Bones are diffusely demineralized. Telemetry leads overlie the chest. IMPRESSION: Hyperexpansion with chronic interstitial coarsening. No acute cardiopulmonary findings. Electronically Signed   By: Camellia Candle M.D.   On: 07/10/2024 09:30   CT HEAD CODE STROKE WO CONTRAST Result Date: 07/10/2024 CLINICAL DATA:  Code stroke.  83 year old female. EXAM: CT HEAD WITHOUT CONTRAST TECHNIQUE: Contiguous axial images were obtained from the base of the skull through the vertex without intravenous contrast. RADIATION DOSE REDUCTION: This exam was performed according to the departmental dose-optimization program which includes automated exposure control, adjustment of the mA and/or kV according to patient size and/or use of iterative reconstruction technique. COMPARISON:  None Available. FINDINGS: Brain: No midline shift, ventriculomegaly, mass effect, evidence of mass lesion, intracranial hemorrhage or  evidence of cortically based acute infarction. Patchy and moderate for age periventricular white matter hypodensity which is most pronounced in the anterior frontal lobes, near the frontal horns and involving the anterior deep white matter capsules. Maintained deep gray nuclei gray-white differentiation. Vascular: Calcified atherosclerosis at the skull base. No suspicious intracranial vascular hyperdensity. Skull: Mild motion artifact. No acute osseous abnormality identified. Osteopenia. Sinuses/Orbits: Right greater than left tympanic cavity and mastoid air cell opacification, low-density mastoid fluid levels on the right. External auditory canals appear to be patent. Visible paranasal sinuses are well aerated. Other: Postoperative changes to both globes. No gaze deviation, acute orbit or scalp soft tissue finding identified. ASPECTS [facility] Stroke Program Early CT Score) ASPECTS 10. IMPRESSION: 1. No acute cortically based infarct or acute intracranial hemorrhage identified. ASPECTS 10. 2. Moderate for age cerebral white matter disease. 3. Right greater than left tympanic cavity and mastoid air cell opacification, consider bilateral otitis media. 4. These results communicated to Dr. Arora at 8:01 am  on 07/10/2024 by text page via the [facility] messaging system. Electronically Signed   By: VEAR Hurst M.D.   On: 07/10/2024 08:01     .Critical Care  Performed by: Levander Houston, MD Authorized by: Levander Houston, MD   Critical care provider statement:    Critical care time (minutes):  65   Critical care end time:  07/10/2024 10:19 AM   Critical care time was exclusive of:  Separately billable procedures and treating other patients and teaching time   Critical care was time spent personally by me on the following activities:  Development of treatment plan with patient or surrogate, discussions with consultants, evaluation of patient's response to treatment, examination of patient, ordering and review of laboratory  studies, ordering and review of radiographic studies, ordering and performing treatments and interventions, pulse oximetry, re-evaluation of patient's condition and review of old charts    Medications Ordered in the ED  sodium chloride  flush (NS) 0.9 % injection 3 mL (0 mLs Intravenous Hold 07/10/24 0806)  albuterol  (PROVENTIL ) (2.5 MG/3ML) 0.083% nebulizer solution 5 mg (5 mg Nebulization Given 07/10/24 0850)  methylPREDNISolone  sodium succinate (SOLU-MEDROL ) 125 mg/2 mL injection 125 mg (125 mg Intravenous Given 07/10/24 1003)    Clinical Course as of 07/10/24 1019  Sun Jul 10, 2024  0802 Discussed with Dr. Voncile.  He is contacting family CT without bleed identified  [DR]    Clinical Course User Index [DR] Levander Houston, MD                                 Medical Decision Making Amount and/or Complexity of Data Reviewed Labs: ordered. Radiology: ordered.  Risk Prescription drug management.   Discussed CODE STATUS with patient and daughter.  Daughter states that they do not wish her to have chest compressions or attempted resuscitation if her heart stops.  She does not want to be intubated.  They do not feel that she would survive this.  However, they do want other interventions including hospitalization, BiPAP, and other medications.  1- cough dyspnea hypoxia- known copd, no obvious infiltrate, wheezing and tachypnea noted. CXR_ no infiltrate Initially required increased oxygen from 3 l/m with sats 85 to nrb ABG with po2 382- oxygen changed back to [place] and sats 95 Patient given Solu-Medrol  and albuterol  here.   2-STroke with left arm weakness- seen as code stroke to CT with Dr. Voncile.  Patient on Eliquis, thrombocytopenia, last normal last night and advises no acute intervention needed. Patient's weakness appears to be improving on exam here 3 DNR DNI status confirmed at bedside with patient and daughter.  However, despite prior hospice notes on discharge, patient and daughter  do wish to have treatment but do not wish intubation or CPR  Discussed with Olam, daughter, at [place], granddaughter and patient at bedside.  At this time, patient wishes to go home with hospice care.  Daughter and granddaughter are in agreement.  They do have resources to call hospice. I have discussed with them that they can return at any time.  She will be started on prednisone .  They voiced understanding of plan.    Final diagnoses:  Chronic obstructive pulmonary disease, unspecified COPD type (HCC)  Cerebrovascular accident (CVA), unspecified mechanism [facility])    ED Discharge Orders          Ordered    predniSONE  (DELTASONE ) 10 MG tablet  Daily,   Status:  Discontinued  07/10/24 1014    predniSONE  (STERAPRED UNI-PAK 21 TAB) 10 MG (21) TBPK tablet        07/10/24 1016               Levander Houston, MD 07/10/24 1019

## 2024-07-10 NOTE — ED Triage Notes (Addendum)
 Pt BIB [facility] from home due to code stroke.  Pt was trying to get off toilet and could not get up.  Family reports left sided droop but patient was normal last night.  LKW [id number] today 9/21/20205. VS BP 170/100, SpO2 95% NRB 10L

## 2024-07-10 NOTE — ED Notes (Signed)
 IV team at bedside

## 2024-07-13 ENCOUNTER — Inpatient Hospital Stay

## 2024-07-13 ENCOUNTER — Inpatient Hospital Stay: Admitting: Hematology & Oncology

## 2024-07-27 ENCOUNTER — Inpatient Hospital Stay: Admitting: Hematology & Oncology

## 2024-07-27 ENCOUNTER — Inpatient Hospital Stay: Attending: Medical Oncology

## 2024-07-27 ENCOUNTER — Encounter: Payer: Self-pay | Admitting: Hematology & Oncology

## 2024-07-27 VITALS — BP 135/59 | HR 83 | Temp 98.8°F | Resp 28 | Wt 103.0 lb

## 2024-07-27 DIAGNOSIS — Z7952 Long term (current) use of systemic steroids: Secondary | ICD-10-CM | POA: Insufficient documentation

## 2024-07-27 DIAGNOSIS — Z79890 Hormone replacement therapy (postmenopausal): Secondary | ICD-10-CM | POA: Diagnosis not present

## 2024-07-27 DIAGNOSIS — D509 Iron deficiency anemia, unspecified: Secondary | ICD-10-CM | POA: Insufficient documentation

## 2024-07-27 DIAGNOSIS — J440 Chronic obstructive pulmonary disease with acute lower respiratory infection: Secondary | ICD-10-CM | POA: Diagnosis not present

## 2024-07-27 DIAGNOSIS — I2699 Other pulmonary embolism without acute cor pulmonale: Secondary | ICD-10-CM | POA: Insufficient documentation

## 2024-07-27 DIAGNOSIS — D500 Iron deficiency anemia secondary to blood loss (chronic): Secondary | ICD-10-CM | POA: Insufficient documentation

## 2024-07-27 DIAGNOSIS — E039 Hypothyroidism, unspecified: Secondary | ICD-10-CM | POA: Insufficient documentation

## 2024-07-27 DIAGNOSIS — Z7901 Long term (current) use of anticoagulants: Secondary | ICD-10-CM | POA: Insufficient documentation

## 2024-07-27 DIAGNOSIS — Z7982 Long term (current) use of aspirin: Secondary | ICD-10-CM | POA: Insufficient documentation

## 2024-07-27 DIAGNOSIS — D510 Vitamin B12 deficiency anemia due to intrinsic factor deficiency: Secondary | ICD-10-CM

## 2024-07-27 DIAGNOSIS — M069 Rheumatoid arthritis, unspecified: Secondary | ICD-10-CM | POA: Insufficient documentation

## 2024-07-27 DIAGNOSIS — K909 Intestinal malabsorption, unspecified: Secondary | ICD-10-CM | POA: Diagnosis not present

## 2024-07-27 DIAGNOSIS — D693 Immune thrombocytopenic purpura: Secondary | ICD-10-CM | POA: Insufficient documentation

## 2024-07-27 DIAGNOSIS — Z79899 Other long term (current) drug therapy: Secondary | ICD-10-CM | POA: Insufficient documentation

## 2024-07-27 DIAGNOSIS — L80 Vitiligo: Secondary | ICD-10-CM | POA: Insufficient documentation

## 2024-07-27 DIAGNOSIS — Z7969 Long term (current) use of other immunomodulators and immunosuppressants: Secondary | ICD-10-CM | POA: Insufficient documentation

## 2024-07-27 DIAGNOSIS — E538 Deficiency of other specified B group vitamins: Secondary | ICD-10-CM

## 2024-07-27 LAB — CMP (CANCER CENTER ONLY)
ALT: 98 U/L — ABNORMAL HIGH (ref 0–44)
AST: 78 U/L — ABNORMAL HIGH (ref 15–41)
Albumin: 3.3 g/dL — ABNORMAL LOW (ref 3.5–5.0)
Alkaline Phosphatase: 98 U/L (ref 38–126)
Anion gap: 11 (ref 5–15)
BUN: 14 mg/dL (ref 8–23)
CO2: 27 mmol/L (ref 22–32)
Calcium: 8.8 mg/dL — ABNORMAL LOW (ref 8.9–10.3)
Chloride: 99 mmol/L (ref 98–111)
Creatinine: 0.59 mg/dL (ref 0.44–1.00)
GFR, Estimated: >60 mL/min (ref >60)
Glucose, Bld: 102 mg/dL — ABNORMAL HIGH (ref 70–99)
Potassium: 5.1 mmol/L (ref 3.5–5.1)
Sodium: 136 mmol/L (ref 135–145)
Total Bilirubin: 0.6 mg/dL (ref 0.0–1.2)
Total Protein: 5.7 g/dL — ABNORMAL LOW (ref 6.5–8.1)

## 2024-07-27 LAB — CBC WITH DIFFERENTIAL (CANCER CENTER ONLY)
Abs Immature Granulocytes: 0.22 K/uL — ABNORMAL HIGH (ref 0.00–0.07)
Basophils Absolute: 0.1 K/uL (ref 0.0–0.1)
Basophils Relative: 0 %
Eosinophils Absolute: 0 K/uL (ref 0.0–0.5)
Eosinophils Relative: 0 %
HCT: 34.9 % — ABNORMAL LOW (ref 36.0–46.0)
Hemoglobin: 11.2 g/dL — ABNORMAL LOW (ref 12.0–15.0)
Immature Granulocytes: 1 %
Lymphocytes Relative: 11 %
Lymphs Abs: 1.7 K/uL (ref 0.7–4.0)
MCH: 34.8 pg — ABNORMAL HIGH (ref 26.0–34.0)
MCHC: 32.1 g/dL (ref 30.0–36.0)
MCV: 108.4 fL — ABNORMAL HIGH (ref 80.0–100.0)
Monocytes Absolute: 1.7 K/uL — ABNORMAL HIGH (ref 0.1–1.0)
Monocytes Relative: 11 %
Neutro Abs: 11.9 K/uL — ABNORMAL HIGH (ref 1.7–7.7)
Neutrophils Relative %: 77 %
Platelet Count: 237 K/uL (ref 150–400)
RBC: 3.22 MIL/uL — ABNORMAL LOW (ref 3.87–5.11)
RDW: 14.9 % (ref 11.5–15.5)
WBC Count: 15.6 K/uL — ABNORMAL HIGH (ref 4.0–10.5)
nRBC: 0 % (ref 0.0–0.2)

## 2024-07-27 LAB — LACTATE DEHYDROGENASE: LDH: 718 U/L — ABNORMAL HIGH (ref 98–192)

## 2024-07-27 LAB — PREALBUMIN: Prealbumin: 20 mg/dL (ref 18–38)

## 2024-07-27 LAB — VITAMIN B12: Vitamin B-12: 541 pg/mL (ref 180–914)

## 2024-07-27 LAB — IRON AND IRON BINDING CAPACITY (CC-WL,HP ONLY)
Iron: 54 ug/dL (ref 28–170)
Saturation Ratios: 22 % (ref 10.4–31.8)
TIBC: 241 ug/dL — ABNORMAL LOW (ref 250–450)
UIBC: 187 ug/dL

## 2024-07-27 LAB — TSH: TSH: 10.6 u[IU]/mL — ABNORMAL HIGH (ref 0.350–4.500)

## 2024-07-27 LAB — FERRITIN: Ferritin: 378 ng/mL — ABNORMAL HIGH (ref 11–307)

## 2024-07-27 NOTE — Progress Notes (Signed)
 Hematology and Oncology Follow Up Visit  Kathleen Pace [id number] Aug 29, 1941 83 y.o. 07/27/2024   Principle Diagnosis:  Chronic immune thrombocytopenia, remission. 2. Pernicious anemia. 3. Vitiligo. 4. Rheumatoid Arthritis 5. Iron  Deficiency Anemia 6. Pulmonary Embolism -09/10/2023  Current Therapy:   Vitamin B12 1 mg IM Q. month - she self administers- last injection given about 2 weeks ago Eliquis- managed by PCP/Cardiology  IV Iron  as needed -- Monoferric  given on 01/08/2022  Venofer  given on 11/30/2023 and 12/07/2023      Interim History:  Ms.  Kathleen Pace is back for follow-up.  Unfortunately, she really looks rough.  She is in a wheelchair.  She is on her oxygen.  She is now with Hospice.  She apparently developed pneumonia.  She was hospitalized for this.  She has tough time getting through this.  Her weight is up however.  I guess she is eating a little bit better.  However, her albumin is down to 3.3 which is somewhat troublesome.  She is not hurting.  She is hard of hearing today.  I just hate that she is declined like this.  Thankfully, there is no problem with expecting any cancer.  Of note she has underlying COPD.  She continues on Eliquis.  She is doing well on the Eliquis.  I will keep her on the Eliquis as I think that she probably is at a higher risk for thromboembolic disease.  She has had no change in bowel or bladder habits.  She has had no problems with fever.  Thankfully, there is been no issues with COVID.  Overall, I would have to say that her performance status is probably ECOG 3.    Wt Readings from Last 3 Encounters:  07/27/24 103 lb (46.7 kg)  05/16/24 88 lb (39.9 kg)  03/15/24 91 lb 6.4 oz (41.5 kg)   Medications:  Current Outpatient Medications:    acetaminophen  (TYLENOL ) 650 MG CR tablet, Take by mouth., Disp: , Rfl:    albuterol  (PROVENTIL  HFA;VENTOLIN  HFA) 108 (90 Base) MCG/ACT inhaler, Inhale 2 puffs into the lungs every 4 (four) hours  as needed., Disp: , Rfl:    amLODipine (NORVASC) 10 MG tablet, Take 10 mg by mouth daily. (Patient taking differently: Take 5 mg by mouth daily.), Disp: , Rfl:    apixaban (ELIQUIS) 2.5 MG TABS tablet, Take 2.5 mg by mouth 2 (two) times daily., Disp: , Rfl:    aspirin 81 MG EC tablet, Take 1 tablet by mouth daily., Disp: , Rfl:    atorvastatin (LIPITOR) 80 MG tablet, SMARTSIG:1 Tablet(s) By Mouth Every Evening, Disp: , Rfl:    CALCIUM-VITAMIN D  PO, Take 1 capsule by mouth every morning. Calcium 600 mg  Vitamin D  200, Disp: , Rfl:    Cholecalciferol (VITAMIN D ) 2000 UNITS tablet, Take 2,000 Units by mouth daily., Disp: , Rfl:    cyanocobalamin  (VITAMIN B12) 1000 MCG/ML injection, INJECT 1 ML EVERY MONTH INTO YOUR MUSCLE, Disp: 3 mL, Rfl: 24   cycloSPORINE (RESTASIS) 0.05 % ophthalmic emulsion, Place 1 drop into both eyes daily., Disp: , Rfl:    esomeprazole (NEXIUM) 20 MG capsule, Take 20 mg by mouth daily at 12 noon., Disp: , Rfl:    Fluticasone-Umeclidin-Vilant (TRELEGY ELLIPTA) 100-62.5-25 MCG/ACT AEPB, Inhale 1 puff into the lungs., Disp: , Rfl:    hydroxychloroquine (PLAQUENIL) 200 MG tablet, 200 mg daily. , Disp: , Rfl:    leflunomide (ARAVA) 10 MG tablet, Take 10 mg by mouth daily. Take 1 and 1/2 tablet  total of 15 mg daily., Disp: , Rfl:    levothyroxine (SYNTHROID, LEVOTHROID) 125 MCG tablet, 125 mcg daily. 08/31/2019 One day each week, will increase to 1.5 tabs. (Patient taking differently: 125 mcg daily. 07/27/2024 One daily except  0.5 tabs on Saturday and Sunday), Disp: , Rfl:    megestrol  (MEGACE ) 400 MG/10ML suspension, Take 10 mLs (400 mg total) by mouth 2 (two) times daily., Disp: 480 mL, Rfl: 4   metoprolol succinate (TOPROL-XL) 25 MG 24 hr tablet, Take 25 mg by mouth daily., Disp: , Rfl:    ondansetron (ZOFRAN-ODT) 8 MG disintegrating tablet, Take 8 mg by mouth every 8 (eight) hours as needed., Disp: , Rfl:    predniSONE  (DELTASONE ) 10 MG tablet, Take 10 mg by mouth 2 (two) times  daily. (Patient taking differently: Take 10 mg by mouth daily.), Disp: , Rfl:    sertraline (ZOLOFT) 50 MG tablet, Take 50 mg by mouth daily., Disp: , Rfl:    valsartan (DIOVAN) 320 MG tablet, Take by mouth daily. , Disp: , Rfl:    mirtazapine  (REMERON ) 7.5 MG tablet, Take 1 tablet (7.5 mg total) by mouth at bedtime. (Patient not taking: Reported on 07/27/2024), Disp: 30 tablet, Rfl: 6   naloxone (NARCAN) nasal spray 4 mg/0.1 mL, Place 1 spray into the nose once. (Patient not taking: Reported on 07/27/2024), Disp: , Rfl:   Allergies:  Allergies  Allergen Reactions   Iodinated Contrast Media Swelling   Sulfa Antibiotics Other (See Comments)    Drops Blood Pressure   Methotrexate Nausea Only and Other (See Comments)    Nose bleeds   Spironolactone Rash    Rash with itching unresponsive to benadryl .    Doxycycline Nausea Only   Penicillins Rash    Patient tolerated cefazolin on 01/10/21   Quinine Derivatives Other (See Comments)    Patient doesn't remember reaction    Past Medical History, Surgical history, Social history, and Family History were reviewed and updated.  Review of Systems: Review of Systems  Constitutional:  Positive for malaise/fatigue.  HENT:  Negative for congestion.   Eyes: Negative.   Respiratory:  Negative for cough.   Cardiovascular:  Positive for palpitations.  Gastrointestinal:  Negative for diarrhea and nausea.  Genitourinary: Negative.   Musculoskeletal:  Positive for joint pain and myalgias.  Skin: Negative.   Neurological: Negative.   Endo/Heme/Allergies: Negative.   Psychiatric/Behavioral: Negative.     Physical Exam:  weight is 103 lb (46.7 kg). Her oral temperature is 98.8 F (37.1 C). Her blood pressure is 135/59 (abnormal) and her pulse is 83. Her respiration is 28 (abnormal) and oxygen saturation is 100%.   Physical Exam Vitals reviewed.  Constitutional:      Comments: Thin, Frail. Elderly   HENT:     Head: Normocephalic and atraumatic.   Eyes:     Pupils: Pupils are equal, round, and reactive to light.  Cardiovascular:     Rate and Rhythm: Normal rate and regular rhythm.     Heart sounds: Normal heart sounds.  Pulmonary:     Effort: Pulmonary effort is normal.     Breath sounds: Normal breath sounds.  Abdominal:     General: Bowel sounds are normal.     Palpations: Abdomen is soft.  Musculoskeletal:        General: No tenderness or deformity. Normal range of motion.     Cervical back: Normal range of motion.  Lymphadenopathy:     Cervical: No cervical adenopathy.  Skin:  General: Skin is warm and dry.     Findings: No erythema or rash.  Neurological:     Mental Status: She is alert and oriented to person, place, and time.  Psychiatric:        Behavior: Behavior normal.        Thought Content: Thought content normal.        Judgment: Judgment normal.      Lab Results  Component Value Date   WBC 15.6 (H) 07/27/2024   HGB 11.2 (L) 07/27/2024   HCT 34.9 (L) 07/27/2024   MCV 108.4 (H) 07/27/2024   PLT 237 07/27/2024     Chemistry      Component Value Date/Time   NA 133 (L) 07/10/2024 0841   NA 144 09/11/2017 1335   NA 138 07/31/2016 0947   K 5.2 (H) 07/10/2024 0841   K 4.2 09/11/2017 1335   K 4.3 07/31/2016 0947   CL 101 07/10/2024 0841   CL 104 09/11/2017 1335   CO2 26 05/16/2024 1143   CO2 28 09/11/2017 1335   CO2 27 07/31/2016 0947   BUN 17 07/10/2024 0841   BUN 17 09/11/2017 1335   BUN 14.7 07/31/2016 0947   CREATININE 0.70 07/10/2024 0841   CREATININE 0.87 05/16/2024 1143   CREATININE 0.9 09/11/2017 1335   CREATININE 0.7 07/31/2016 0947      Component Value Date/Time   CALCIUM 10.1 05/16/2024 1143   CALCIUM 9.0 09/11/2017 1335   CALCIUM 9.2 07/31/2016 0947   ALKPHOS 115 05/16/2024 1143   ALKPHOS 102 (H) 09/11/2017 1335   ALKPHOS 70 07/31/2016 0947   AST 45 (H) 05/16/2024 1143   AST 18 07/31/2016 0947   ALT 24 05/16/2024 1143   ALT 20 09/11/2017 1335   ALT 9 07/31/2016 0947    BILITOT 0.6 05/16/2024 1143   BILITOT 0.62 07/31/2016 0947      Impression and Plan:  Ms. Coven is 83 year old white female. She has clear autoimmune issues. She has pernicious anemia. She has vitiligo. She has hypothyroidism.  She is on Plaquenil for rheumatism.  Her COPD clearly is the problem right now.  Again, she really looks a lot more febrile than when I last saw her.  This pneumonia really has taken a lot out of her.  For right now, I would like to try to get her back before Thanksgiving.  We will have to see how everything trends with her blood work.  Hospice clearly is doing a good job with her.  I think Hospice would not be a bad idea for her.  Maude JONELLE Crease, MD 10/8/20253:32 PM

## 2024-07-28 NOTE — Telephone Encounter (Signed)
 Per Dr. Timmy, I called and spoke to daughter Olam regarding Synthroid. Lisa stated,me and my daughter put her medications together for her. I know she is taking the Synthroid. She lives with us  now. I'm also getting her new hearing aides, so, if you need anything, please call me. She verbalized understanding.

## 2024-07-28 NOTE — Telephone Encounter (Signed)
-----   Message from Maude JONELLE Crease sent at 07/28/2024  8:07 AM EDT ----- Please let her know that the thyroid  is on the lower side.  Please make sure that she is taking her Synthroid daily.  Thanks.SABRA  Jeralyn ----- Message ----- From: Rebecka, Lab In [place] Sent: 07/27/2024   3:31 PM EDT To: Maude JONELLE Crease, MD

## 2024-09-07 ENCOUNTER — Inpatient Hospital Stay

## 2024-09-07 ENCOUNTER — Inpatient Hospital Stay: Admitting: Hematology & Oncology

## 2024-09-19 DEATH — deceased
